# Patient Record
Sex: Male | Born: 1970
Health system: Southern US, Community
[De-identification: ages and names within clinical notes are randomized; demographics above are authoritative.]

## PROBLEM LIST (undated history)

## (undated) DIAGNOSIS — F419 Anxiety disorder, unspecified: Secondary | ICD-10-CM

## (undated) DIAGNOSIS — K219 Gastro-esophageal reflux disease without esophagitis: Secondary | ICD-10-CM

## (undated) DIAGNOSIS — G4733 Obstructive sleep apnea (adult) (pediatric): Secondary | ICD-10-CM

## (undated) DIAGNOSIS — G8929 Other chronic pain: Secondary | ICD-10-CM

## (undated) DIAGNOSIS — E291 Testicular hypofunction: Secondary | ICD-10-CM

## (undated) DIAGNOSIS — R635 Abnormal weight gain: Secondary | ICD-10-CM

## (undated) DIAGNOSIS — IMO0002 Reserved for concepts with insufficient information to code with codable children: Secondary | ICD-10-CM

## (undated) DIAGNOSIS — R6882 Decreased libido: Secondary | ICD-10-CM

## (undated) DIAGNOSIS — T7840XA Allergy, unspecified, initial encounter: Secondary | ICD-10-CM

## (undated) DIAGNOSIS — M9250 Juvenile osteochondrosis of tibia and fibula, unspecified leg: Secondary | ICD-10-CM

## (undated) DIAGNOSIS — R519 Headache, unspecified: Secondary | ICD-10-CM

## (undated) DIAGNOSIS — F329 Major depressive disorder, single episode, unspecified: Secondary | ICD-10-CM

## (undated) DIAGNOSIS — K3184 Gastroparesis: Secondary | ICD-10-CM

## (undated) DIAGNOSIS — F319 Bipolar disorder, unspecified: Secondary | ICD-10-CM

## (undated) DIAGNOSIS — M92529 Juvenile osteochondrosis of tibia tubercle, unspecified leg: Secondary | ICD-10-CM

## (undated) DIAGNOSIS — G709 Myoneural disorder, unspecified: Secondary | ICD-10-CM

## (undated) DIAGNOSIS — F17200 Nicotine dependence, unspecified, uncomplicated: Secondary | ICD-10-CM

## (undated) DIAGNOSIS — R51 Headache: Secondary | ICD-10-CM

## (undated) DIAGNOSIS — J309 Allergic rhinitis, unspecified: Secondary | ICD-10-CM

## (undated) HISTORY — DX: Allergic rhinitis, unspecified: J30.9

## (undated) HISTORY — DX: Juvenile osteochondrosis of tibia and fibula, unspecified leg: M92.50

## (undated) HISTORY — DX: Other chronic pain: G89.29

## (undated) HISTORY — DX: Obstructive sleep apnea (adult) (pediatric): G47.33

## (undated) HISTORY — DX: Juvenile osteochondrosis of tibia tubercle, unspecified leg: M92.529

## (undated) HISTORY — PX: ANKLE SURGERY: SHX546

## (undated) HISTORY — PX: FRACTURE SURGERY: SHX138

## (undated) HISTORY — DX: Allergy, unspecified, initial encounter: T78.40XA

## (undated) HISTORY — DX: Reserved for concepts with insufficient information to code with codable children: IMO0002

## (undated) HISTORY — PX: EYE SURGERY: SHX253

## (undated) HISTORY — DX: Nicotine dependence, unspecified, uncomplicated: F17.200

## (undated) HISTORY — DX: Abnormal weight gain: R63.5

## (undated) HISTORY — DX: Testicular hypofunction: E29.1

## (undated) HISTORY — DX: Gastro-esophageal reflux disease without esophagitis: K21.9

## (undated) HISTORY — DX: Decreased libido: R68.82

## (undated) HISTORY — DX: Major depressive disorder, single episode, unspecified: F32.9

## (undated) HISTORY — DX: Headache: R51

## (undated) HISTORY — DX: Headache, unspecified: R51.9

## (undated) HISTORY — DX: Anxiety disorder, unspecified: F41.9

## (undated) HISTORY — PX: LASIK: SHX215

---

## 2009-05-10 ENCOUNTER — Ambulatory Visit: Payer: Self-pay | Admitting: Internal Medicine

## 2009-05-10 DIAGNOSIS — R6882 Decreased libido: Secondary | ICD-10-CM | POA: Insufficient documentation

## 2009-05-10 DIAGNOSIS — F3289 Other specified depressive episodes: Secondary | ICD-10-CM

## 2009-05-10 DIAGNOSIS — K219 Gastro-esophageal reflux disease without esophagitis: Secondary | ICD-10-CM | POA: Insufficient documentation

## 2009-05-10 DIAGNOSIS — J309 Allergic rhinitis, unspecified: Secondary | ICD-10-CM | POA: Insufficient documentation

## 2009-05-10 DIAGNOSIS — R635 Abnormal weight gain: Secondary | ICD-10-CM

## 2009-05-10 DIAGNOSIS — F329 Major depressive disorder, single episode, unspecified: Secondary | ICD-10-CM

## 2009-05-10 DIAGNOSIS — R519 Headache, unspecified: Secondary | ICD-10-CM | POA: Insufficient documentation

## 2009-05-10 DIAGNOSIS — R51 Headache: Secondary | ICD-10-CM | POA: Insufficient documentation

## 2009-05-10 HISTORY — DX: Allergic rhinitis, unspecified: J30.9

## 2009-05-10 HISTORY — DX: Decreased libido: R68.82

## 2009-05-10 HISTORY — DX: Major depressive disorder, single episode, unspecified: F32.9

## 2009-05-10 HISTORY — DX: Gastro-esophageal reflux disease without esophagitis: K21.9

## 2009-05-10 HISTORY — DX: Abnormal weight gain: R63.5

## 2009-05-10 HISTORY — DX: Other specified depressive episodes: F32.89

## 2009-05-11 ENCOUNTER — Telehealth: Payer: Self-pay | Admitting: Internal Medicine

## 2009-05-11 LAB — CONVERTED CEMR LAB
ALT: 28 units/L (ref 0–53)
AST: 27 units/L (ref 0–37)
Albumin: 3.8 g/dL (ref 3.5–5.2)
Alkaline Phosphatase: 57 units/L (ref 39–117)
BUN: 6 mg/dL (ref 6–23)
Basophils Absolute: 0 10*3/uL (ref 0.0–0.1)
Basophils Relative: 0 % (ref 0.0–3.0)
Bilirubin, Direct: 0 mg/dL (ref 0.0–0.3)
CO2: 28 meq/L (ref 19–32)
Calcium: 8.9 mg/dL (ref 8.4–10.5)
Chloride: 105 meq/L (ref 96–112)
Cholesterol: 219 mg/dL — ABNORMAL HIGH (ref 0–200)
Creatinine, Ser: 0.9 mg/dL (ref 0.4–1.5)
Direct LDL: 130.6 mg/dL
Eosinophils Absolute: 0.2 10*3/uL (ref 0.0–0.7)
Eosinophils Relative: 3.5 % (ref 0.0–5.0)
GFR calc non Af Amer: 99.12 mL/min (ref >60)
Glucose, Bld: 103 mg/dL — ABNORMAL HIGH (ref 70–99)
HCT: 44 % (ref 39.0–52.0)
HDL: 48.8 mg/dL (ref >39.00)
Hemoglobin: 15.1 g/dL (ref 13.0–17.0)
Lymphocytes Relative: 33.7 % (ref 12.0–46.0)
Lymphs Abs: 1.6 10*3/uL (ref 0.7–4.0)
MCHC: 34.3 g/dL (ref 30.0–36.0)
MCV: 93.3 fL (ref 78.0–100.0)
Monocytes Absolute: 0.5 10*3/uL (ref 0.1–1.0)
Monocytes Relative: 9.6 % (ref 3.0–12.0)
Neutro Abs: 2.5 10*3/uL (ref 1.4–7.7)
Neutrophils Relative %: 53.2 % (ref 43.0–77.0)
Platelets: 230 10*3/uL (ref 150.0–400.0)
Potassium: 4.4 meq/L (ref 3.5–5.1)
RBC: 4.71 M/uL (ref 4.22–5.81)
RDW: 13 % (ref 11.5–14.6)
Sodium: 141 meq/L (ref 135–145)
TSH: 1.46 microintl units/mL (ref 0.35–5.50)
Testosterone: 297.89 ng/dL — ABNORMAL LOW (ref 350.00–890.00)
Total Bilirubin: 0.8 mg/dL (ref 0.3–1.2)
Total CHOL/HDL Ratio: 4
Total Protein: 6.6 g/dL (ref 6.0–8.3)
Triglycerides: 272 mg/dL — ABNORMAL HIGH (ref 0.0–149.0)
VLDL: 54.4 mg/dL — ABNORMAL HIGH (ref 0.0–40.0)
WBC: 4.8 10*3/uL (ref 4.5–10.5)

## 2009-06-12 ENCOUNTER — Ambulatory Visit: Payer: Self-pay | Admitting: Internal Medicine

## 2009-06-12 DIAGNOSIS — E349 Endocrine disorder, unspecified: Secondary | ICD-10-CM | POA: Insufficient documentation

## 2009-06-12 DIAGNOSIS — G4733 Obstructive sleep apnea (adult) (pediatric): Secondary | ICD-10-CM

## 2009-06-12 DIAGNOSIS — F17200 Nicotine dependence, unspecified, uncomplicated: Secondary | ICD-10-CM | POA: Insufficient documentation

## 2009-06-12 DIAGNOSIS — E291 Testicular hypofunction: Secondary | ICD-10-CM

## 2009-06-12 HISTORY — DX: Testicular hypofunction: E29.1

## 2009-06-12 HISTORY — DX: Nicotine dependence, unspecified, uncomplicated: F17.200

## 2009-06-12 HISTORY — DX: Obstructive sleep apnea (adult) (pediatric): G47.33

## 2009-07-10 ENCOUNTER — Ambulatory Visit (HOSPITAL_BASED_OUTPATIENT_CLINIC_OR_DEPARTMENT_OTHER): Admission: RE | Admit: 2009-07-10 | Discharge: 2009-07-10 | Payer: Self-pay | Admitting: Internal Medicine

## 2009-07-10 ENCOUNTER — Encounter (INDEPENDENT_AMBULATORY_CARE_PROVIDER_SITE_OTHER): Payer: Self-pay | Admitting: *Deleted

## 2009-07-23 ENCOUNTER — Ambulatory Visit: Payer: Self-pay | Admitting: Pulmonary Disease

## 2009-07-31 ENCOUNTER — Telehealth: Payer: Self-pay | Admitting: Internal Medicine

## 2009-08-02 ENCOUNTER — Encounter (INDEPENDENT_AMBULATORY_CARE_PROVIDER_SITE_OTHER): Payer: Self-pay | Admitting: *Deleted

## 2009-08-15 ENCOUNTER — Ambulatory Visit: Payer: Self-pay | Admitting: Internal Medicine

## 2009-08-15 DIAGNOSIS — K5289 Other specified noninfective gastroenteritis and colitis: Secondary | ICD-10-CM | POA: Insufficient documentation

## 2009-09-12 ENCOUNTER — Encounter: Payer: Self-pay | Admitting: Internal Medicine

## 2009-12-12 ENCOUNTER — Telehealth: Payer: Self-pay | Admitting: Internal Medicine

## 2010-04-03 ENCOUNTER — Encounter: Payer: Self-pay | Admitting: Internal Medicine

## 2010-08-29 ENCOUNTER — Telehealth: Payer: Self-pay | Admitting: Internal Medicine

## 2010-09-12 ENCOUNTER — Ambulatory Visit
Admission: RE | Admit: 2010-09-12 | Discharge: 2010-09-12 | Payer: Self-pay | Source: Home / Self Care | Attending: Internal Medicine | Admitting: Internal Medicine

## 2010-09-12 ENCOUNTER — Other Ambulatory Visit: Payer: Self-pay | Admitting: Internal Medicine

## 2010-09-12 LAB — TESTOSTERONE: Testosterone: 202.19 ng/dL — ABNORMAL LOW (ref 350.00–890.00)

## 2010-09-17 ENCOUNTER — Encounter: Payer: Self-pay | Admitting: Internal Medicine

## 2010-10-01 NOTE — Letter (Signed)
Summary: [facility]  [facility]   Imported By: Maryln Gottron 04/22/2010 14:38:56  _____________________________________________________________________  External Attachment:    Type:   Image     Comment:   External Document

## 2010-10-01 NOTE — Progress Notes (Signed)
Summary: no call no show  no call no show - attempt to call to f/u - voice mail - left msg. KIK

## 2010-10-02 ENCOUNTER — Encounter: Payer: Self-pay | Admitting: Internal Medicine

## 2010-10-03 ENCOUNTER — Encounter: Payer: Self-pay | Admitting: Internal Medicine

## 2010-10-03 ENCOUNTER — Ambulatory Visit (INDEPENDENT_AMBULATORY_CARE_PROVIDER_SITE_OTHER): Payer: 59 | Admitting: Internal Medicine

## 2010-10-03 VITALS — BP 128/80 | HR 96 | Temp 97.7°F | Resp 18 | Ht 68.0 in | Wt 230.0 lb

## 2010-10-03 DIAGNOSIS — J309 Allergic rhinitis, unspecified: Secondary | ICD-10-CM

## 2010-10-03 DIAGNOSIS — F17200 Nicotine dependence, unspecified, uncomplicated: Secondary | ICD-10-CM

## 2010-10-03 DIAGNOSIS — L02413 Cutaneous abscess of right upper limb: Secondary | ICD-10-CM | POA: Insufficient documentation

## 2010-10-03 DIAGNOSIS — IMO0002 Reserved for concepts with insufficient information to code with codable children: Secondary | ICD-10-CM

## 2010-10-03 MED ORDER — FLUTICASONE PROPIONATE 50 MCG/ACT NA SUSP: 1.0000 | Freq: Every day | NASAL | Status: DC

## 2010-10-03 MED ORDER — AMOXICILLIN-POT CLAVULANATE 875-125 MG PO TABS: 1.0000 | ORAL_TABLET | Freq: Two times a day (BID) | ORAL | Status: AC

## 2010-10-03 NOTE — Patient Instructions (Signed)
Smoking tobacco is very bad for your health. You should stop smoking immediately.  Take your antibiotic as prescribed until ALL of it is gone, but stop if you develop a rash, swelling, or any side effects of the medication.  Contact our office as soon as possible if  there are side effects of the medication.

## 2010-10-03 NOTE — Progress Notes (Signed)
Summary: recheck lab  Phone Note Call from Patient Call back at [phone]   [name]: Patient Call For: Sean Savers  MD Summary of Call: pt would like testosterone level recheck can I sch? Initial call taken by: Heron Sabins,  August 29, 2010 10:49 AM  Follow-up for Phone Call        OK -do early am draw Follow-up by: Sean Savers  MD,  August 29, 2010 1:20 PM  Additional Follow-up for Phone Call Additional follow up Details #1::        lmom/lmom 09-03-2009/lmom 1-6 [facility] 09-10-2009 Additional Follow-up by: Heron Sabins,  August 29, 2010 2:25 PM    Additional Follow-up for Phone Call Additional follow up Details #2::    09-12-2010 10.15A LAB OV Follow-up by: Heron Sabins,  September 10, 2010 11:19 AM

## 2010-10-03 NOTE — Progress Notes (Signed)
  Subjective:    Patient ID: Sean Wu, male    DOB: 02/05/1971, 40 y.o.   MRN: [id number]  HPI  38  -year-old patient who has a history of allergic rhinitis.  For the past several weeks he has had worsening sinus congestion some purulent drainage and discomfort.  He has been using Allegra.  He has benefited from nasal steroids in the past.  He received immunotherapy in August of last year and has had a papule involving his right upper arm since the injection.  For the past couple of weeks she has had increasing pain and redness.  There is been no fever chills or other constitutional complaints. He has a history of depression and obstructive sleep apnea which have been stable.    Review of Systems  Constitutional: Negative for fever, chills, appetite change and fatigue.  HENT: Positive for congestion, rhinorrhea and postnasal drip. Negative for hearing loss, ear pain, sore throat, trouble swallowing, neck stiffness, dental problem, voice change and tinnitus.   Eyes: Negative for pain, discharge and visual disturbance.  Respiratory: Negative for cough, chest tightness, wheezing and stridor.   Cardiovascular: Negative for chest pain, palpitations and leg swelling.  Gastrointestinal: Negative for nausea, vomiting, abdominal pain, diarrhea, constipation, blood in stool and abdominal distention.  Genitourinary: Negative for urgency, hematuria, flank pain, discharge, difficulty urinating and genital sores.  Musculoskeletal: Negative for myalgias, back pain, joint swelling, arthralgias and gait problem.  Skin: Positive for rash.  Neurological: Negative for dizziness, syncope, speech difficulty, weakness, numbness and headaches.  Hematological: Negative for adenopathy. Does not bruise/bleed easily.  Psychiatric/Behavioral: Negative for behavioral problems and dysphoric mood. The patient is not nervous/anxious.        Objective:   Physical Exam  Constitutional: He is oriented to person, place,  and time. He appears well-developed and well-nourished.  HENT:  Head: Normocephalic.  Right Ear: External ear normal.  Left Ear: External ear normal.  Eyes: Conjunctivae and EOM are normal.  Neck: Normal range of motion.  Cardiovascular: Normal rate and normal heart sounds.   Pulmonary/Chest: Breath sounds normal.  Abdominal: Bowel sounds are normal.  Musculoskeletal: Normal range of motion. He exhibits no edema and no tenderness.  Neurological: He is alert and oriented to person, place, and time.  Skin: Skin is warm and dry. Rash (erythematous  nodule R upper outer arm; no fluctuance ) noted.  Psychiatric: He has a normal mood and affect. His behavior is normal.          Assessment & Plan:  1.  Allergic rhinitis- may have a low-grade sinus infection.  Will treat with Augmentin, primarily for his skin and soft tissue infection.  Will also place on nasal steroids, which had been quite helpful in the past 2.  Skin and soft tissue infection right upper arm.  Local skin care discussed.  Will tree with antibiotic therapy 3. Tobacco abuse.  Cessation of smoking.  Encouraged

## 2010-10-03 NOTE — Letter (Signed)
Summary: Generic Letter  [employer] [facility]  [address] [place], [place] [address]   Phone: [phone]  Fax: [phone]    09/17/2010  [place] [address] [place], [place]  [address]  Dear Mr. Cesaro,  I have been unable to reach you by phone to discuss recent lab work drawn and medication that Dr. Frederica Kuster would like to have you start.  Please contact the office at your convenience to disuss.          Sincerely,   Duard Brady LPN

## 2010-10-09 NOTE — Assessment & Plan Note (Signed)
Summary: bump on right arm//ccm   Allergies: No Known Drug Allergies   Complete Medication List: 1)  Lamictal 150 Mg Tabs (Lamotrigine) .Marland Kitchen.. 1 once daily 2)  Depakote 500 Mg Tbec (Divalproex sodium) .... 3 once daily 3)  Androgel 50 Mg/5gm Gel (Testosterone) .... Apply daily 4)  Promethazine Hcl 50 Mg Tabs (Promethazine hcl) .... One every 6 hours as needed for nausea 5)  Diphenoxylate-atropine 2.5-0.025 Mg Tabs (Diphenoxylate-atropine) .... Two initially, then one every 4 hours as needed for diarrhea 6)  Androgel 50 Mg/5gm Gel (Testosterone) .... Apply q am as directed

## 2010-11-05 ENCOUNTER — Other Ambulatory Visit: Payer: Self-pay | Admitting: Internal Medicine

## 2010-11-05 NOTE — Telephone Encounter (Signed)
Pt called and said that [facility] mail order pharmacy req 3 month supply of androgel. This has to be faxed to [facility]. Can not be called in.

## 2010-11-06 ENCOUNTER — Other Ambulatory Visit: Payer: Self-pay

## 2010-11-06 ENCOUNTER — Telehealth: Payer: Self-pay

## 2010-11-06 MED ORDER — TESTOSTERONE 50 MG/5GM (1%) TD GEL: 5.0000 g | Freq: Every day | TRANSDERMAL | Status: DC

## 2010-11-06 NOTE — Telephone Encounter (Signed)
Medication filled and faxed to [facility].

## 2010-11-06 NOTE — Telephone Encounter (Signed)
androgel rx to [facility] done. KIK

## 2010-11-06 NOTE — Telephone Encounter (Signed)
Error

## 2010-11-21 ENCOUNTER — Telehealth: Payer: Self-pay | Admitting: Internal Medicine

## 2010-11-21 NOTE — Telephone Encounter (Signed)
viagra 100 mg  #12  RF 3

## 2010-11-21 NOTE — Telephone Encounter (Signed)
Please advise 

## 2010-11-21 NOTE — Telephone Encounter (Signed)
Pt called and said having erectile dysfunction problems, even after being on testosterone. Pt req Viagra or Cialis, or alternative. Pls call pt asap.    Pt uses [facility] [address].

## 2010-11-22 MED ORDER — SILDENAFIL CITRATE 100 MG PO TABS: 100.0000 mg | ORAL_TABLET | ORAL | Status: DC | PRN

## 2010-11-22 NOTE — Telephone Encounter (Signed)
Med added to list - pt aware and called into [facility]

## 2010-11-28 ENCOUNTER — Encounter: Payer: Self-pay | Admitting: Internal Medicine

## 2010-11-28 ENCOUNTER — Ambulatory Visit (INDEPENDENT_AMBULATORY_CARE_PROVIDER_SITE_OTHER): Payer: 59 | Admitting: Internal Medicine

## 2010-11-28 VITALS — BP 120/80 | Temp 98.4°F | Ht 69.0 in | Wt 224.0 lb

## 2010-11-28 DIAGNOSIS — M549 Dorsalgia, unspecified: Secondary | ICD-10-CM

## 2010-11-28 MED ORDER — METHYLPREDNISOLONE ACETATE 80 MG/ML IJ SUSP
80.0000 mg | Freq: Once | INTRAMUSCULAR | Status: AC
  Administered 2010-11-28: 80 mg via INTRAMUSCULAR

## 2010-11-28 MED ORDER — CYCLOBENZAPRINE HCL 10 MG PO TABS: 10.0000 mg | ORAL_TABLET | Freq: Three times a day (TID) | ORAL | Status: AC | PRN

## 2010-11-28 NOTE — Progress Notes (Signed)
  Subjective:    Patient ID: Sean Wu, male    DOB: [birth year], 40 y.o.   MRN: [id number]  HPI  40 year old patient who is seen today with a chief complaint of back pain. This has been present for approximately 6 days pain is localized in the mid back more on the left. He does fairly well throughout the day but has significant pain during the night that interferes with his sleep. Pain in his left mid back area. Pain is aggravated by twisting especially to the left. He has had no trauma and no unusual activities. No history of chronic back pain. No constitutional symptoms    Review of Systems  Constitutional: Negative for fever, chills, appetite change and fatigue.  Musculoskeletal: Positive for back pain. Negative for joint swelling, arthralgias and gait problem.       Objective:   Physical Exam  Constitutional: He appears well-developed and well-nourished. No distress.  Musculoskeletal: Normal range of motion. He exhibits no edema.       No tenderness over the spinous processes. There was some tenderness in the left mid back area. The paravertebral musculature appear to be tight and tense pain was aggravated somewhat twisting to the left. There is no low back pain straight leg testing was negative          Assessment & Plan:  Mid back pain left-sided. This appears to be musculoligamentous. We'll treat with 80 mg of Depo-Medrol and if needed will start Aleve. His symptoms are worse at night and will try a bedtime dose of Flexeril. He therapy gentle massage therapy also discussed

## 2010-11-28 NOTE — Patient Instructions (Signed)
Most patients with low back pain will improve with time over the next two  weeks.  Keep active but avoid any activities that cause pain.  Apply moist heat to the low back area several times daily.   Take Aleve 200 mg twice daily for pain or swelling  Call or return to clinic prn if these symptoms worsen or fail to improve as anticipated.

## 2011-01-10 ENCOUNTER — Ambulatory Visit (INDEPENDENT_AMBULATORY_CARE_PROVIDER_SITE_OTHER): Payer: 59 | Admitting: Internal Medicine

## 2011-01-10 ENCOUNTER — Encounter: Payer: Self-pay | Admitting: Internal Medicine

## 2011-01-10 VITALS — BP 124/90 | Temp 98.4°F | Wt 227.0 lb

## 2011-01-10 DIAGNOSIS — K219 Gastro-esophageal reflux disease without esophagitis: Secondary | ICD-10-CM

## 2011-01-10 MED ORDER — OMEPRAZOLE MAGNESIUM 20 MG PO TBEC: DELAYED_RELEASE_TABLET | ORAL | Status: DC

## 2011-01-10 NOTE — Progress Notes (Signed)
  Subjective:    Patient ID: Sean Wu, male    DOB: [birth year], 40 y.o.   MRN: [id number]  HPI  40 year old patient who has a history of gastroesophageal reflux disease. He presents today with a several day history of increasing GERD symptoms with pain bloating and occasional vomiting. He has obstructive sleep apnea using CPAP and he states that he has occasionally vomited into his CPAP apparatus;  he has been using Prilosec 20 mg which he takes once a day in the morning.   There's been some modest weight gain. He is not on any real anti-reflux diet  Review of Systems  Constitutional: Negative for fever, chills, appetite change and fatigue.  HENT: Negative for hearing loss, ear pain, congestion, sore throat, trouble swallowing, neck stiffness, dental problem, voice change and tinnitus.   Eyes: Negative for pain, discharge and visual disturbance.  Respiratory: Negative for cough, chest tightness, wheezing and stridor.   Cardiovascular: Negative for chest pain, palpitations and leg swelling.  Gastrointestinal: Positive for nausea, abdominal pain and abdominal distention. Negative for vomiting, diarrhea, constipation and blood in stool.  Genitourinary: Negative for urgency, hematuria, flank pain, discharge, difficulty urinating and genital sores.  Musculoskeletal: Negative for myalgias, back pain, joint swelling, arthralgias and gait problem.  Skin: Negative for rash.  Neurological: Negative for dizziness, syncope, speech difficulty, weakness, numbness and headaches.  Hematological: Negative for adenopathy. Does not bruise/bleed easily.  Psychiatric/Behavioral: Negative for behavioral problems and dysphoric mood. The patient is not nervous/anxious.        Objective:   Physical Exam  Constitutional: He is oriented to person, place, and time. He appears well-developed and well-nourished. No distress.  HENT:  Head: Normocephalic.  Right Ear: External ear normal.  Left Ear: External ear  normal.  Eyes: Conjunctivae and EOM are normal.  Neck: Normal range of motion.  Cardiovascular: Normal rate and normal heart sounds.   Pulmonary/Chest: Breath sounds normal.  Abdominal: Soft. Bowel sounds are normal. He exhibits no distension and no mass. There is no tenderness. There is no rebound and no guarding.  Musculoskeletal: Normal range of motion. He exhibits no edema and no tenderness.  Neurological: He is alert and oriented to person, place, and time.  Psychiatric: He has a normal mood and affect. His behavior is normal.          Assessment & Plan:  Gastroesophageal reflux disease. Moderately severe. The patient will increase his Prilosec to a twice a day regimen. He will attempt to take this medication fasting. He would be placed on a aggressive anti-reflux regimen. He will call if unimproved

## 2011-01-10 NOTE — Patient Instructions (Signed)
Avoids foods high in acid such as tomatoes citrus juices, and spicy foods.  Avoid eating within two hours of lying down or before exercising.  Do not overheat.  Try smaller more frequent meals.  If symptoms persist, elevate the head of her bed 12 inches while sleeping.  You need to lose weight.  Consider a lower calorie diet and regular exercise.  Call or return to clinic prn if these symptoms worsen or fail to improve as anticipated. Acid Reflux (GERD) Acid reflux is also called gastroesophageal reflux disease (GERD). Your stomach makes acid to help digest food. Acid reflux happens when acid from your stomach goes into the tube between your mouth and stomach (esophagus). Your stomach is protected from the acid, but this tube is not. When acid gets into the tube, it may cause a burning feeling in the chest (heartburn). Besides heartburn, other health problems can happen if the acid keeps going into the tube. Some causes of acid reflux include:  Being overweight.   Smoking.   Drinking alcohol.   Eating large meals.   Eating meals and then going to bed right away.   Eating certain foods.   Increased stomach acid production.  HOME CARE  Take all medicine as told by your doctor.   You may need to:   Lose weight.   Avoid alcohol.   Quit smoking.   Do not eat big meals. It is better to eat smaller meals throughout the day.   Do not eat a meal and then nap or go to bed.   Sleep with your head higher than your stomach.   Avoid foods that bother you.   You may need more tests, or you may need to see a special doctor.  GET HELP RIGHT AWAY IF:  You have chest pain that is different than before.   You have pain that goes to your arms, jaw, or between your shoulder blades.   You throw up (vomit) blood, dark brown liquid, or your throw up looks like coffee grounds.   You have trouble swallowing.   You have trouble breathing or cannot stop coughing.   You feel dizzy or pass  out.   Your skin is cool, wet, and pale.   Your medicine is not helping.  MAKE SURE YOU:    Understand these instructions.   Will watch your condition.   Will get help right away if you are not doing well or get worse.  Document Released: 02/04/2008 Document Re-Released: 11/12/2009 [facility] Patient Information 2011 [place], [place].Acid Reflux (GERD) Acid reflux is also called gastroesophageal reflux disease (GERD). Your stomach makes acid to help digest food. Acid reflux happens when acid from your stomach goes into the tube between your mouth and stomach (esophagus). Your stomach is protected from the acid, but this tube is not. When acid gets into the tube, it may cause a burning feeling in the chest (heartburn). Besides heartburn, other health problems can happen if the acid keeps going into the tube. Some causes of acid reflux include:  Being overweight.   Smoking.   Drinking alcohol.   Eating large meals.   Eating meals and then going to bed right away.   Eating certain foods.   Increased stomach acid production.  HOME CARE  Take all medicine as told by your doctor.   You may need to:   Lose weight.   Avoid alcohol.   Quit smoking.   Do not eat big meals. It is better to eat  smaller meals throughout the day.   Do not eat a meal and then nap or go to bed.   Sleep with your head higher than your stomach.   Avoid foods that bother you.   You may need more tests, or you may need to see a special doctor.  GET HELP RIGHT AWAY IF:  You have chest pain that is different than before.   You have pain that goes to your arms, jaw, or between your shoulder blades.   You throw up (vomit) blood, dark brown liquid, or your throw up looks like coffee grounds.   You have trouble swallowing.   You have trouble breathing or cannot stop coughing.   You feel dizzy or pass out.   Your skin is cool, wet, and pale.   Your medicine is not helping.  MAKE SURE YOU:     Understand these instructions.   Will watch your condition.   Will get help right away if you are not doing well or get worse.  Document Released: 02/04/2008 Document Re-Released: 11/12/2009 [facility] Patient Information 2011 [place], [place].Heartburn Heartburn is a painful, burning sensation in the chest. It may feel worse in certain positions, such as lying down or bending over. It is caused by stomach acid backing up into the tube that carries food from the mouth down to the stomach (lower esophagus).   CAUSES A number of conditions can cause or worsen heartburn, including:    Pregnancy.   Being overweight (obesity).   A condition called hiatal hernia, in which part or all of the stomach is moved up into the chest through a weakness in the diaphragm muscle.   Alcohol.   Exercise.   Eating just before going to bed.   Overeating.   Medications, including:   Nonsteroidal anti-inflammatory drugs, such as ibuprofen and naproxen.   Aspirin.   Some blood pressure medicines, including beta-blockers, calcium channel blockers, and alpha-blockers.   Nitrates (used to treat angina).   The asthma medication Theophylline.   Certain sedative drugs.   Heartburn may be worse after eating certain foods. These heartburn-causing foods are different for different people, but may include:   Peppers.   Chocolate.   Coffee.   High-fat foods, including fried foods.   Spicy foods.   Garlic, onions.   Citrus fruits, including oranges, grapefruit, lemons and limes.   Food containing tomatoes or tomato products.   Mint.   Carbonated beverages.   Vinegar.  SYMPTOMS   Symptoms may last for a few minutes or a few hours, and can include:  Burning pain in the chest or lower throat.   Bitter taste in the mouth.   Coughing.  DIAGNOSIS If the usual treatments for heartburn do not improve your symptoms, then tests may be done to see if there is another condition present.  Possible tests may include:  X-rays.   Endoscopy. This is when a tube with a light and a camera on the end is used to examine the esophagus and the stomach.   Blood, breath, or stool tests may be used to check for bacteria that cause ulcers.  TREATMENT There are a number of non-prescription medicines used to treat heartburn, including:  Antacids.   Acid reducers (also called H-2 blockers).   Proton-pump inhibitors.  HOME CARE INSTRUCTIONS  Raise the head of your bed by putting blocks under the legs.   Eat 2-3 hours before going to bed.   Stop smoking.   Try to reach and maintain  a healthy weight.   Do not eat just a few very large meals. Instead, eat many smaller meals throughout the day.   Try to identify foods and beverages that make your symptoms worse, and avoid these.   Avoid tight clothing.   Do not exercise right after eating.  SEEK IMMEDIATE MEDICAL CARE IF YOU:  Have severe chest pain that goes down your arm, or into your jaw or neck.   Feel sweaty, dizzy, or lightheaded.   Are short of breath.   Throw up (vomit) blood.   Have difficulty or pain with swallowing.   Have bloody or black, tarry stools.   Have bouts of heartburn more than three times a week for more than two weeks.  Document Released: 01/04/2009 Document Re-Released: 11/12/2009 [facility] Patient Information 2011 [place], [place].Heartburn Heartburn is a painful, burning sensation in the chest. It may feel worse in certain positions, such as lying down or bending over. It is caused by stomach acid backing up into the tube that carries food from the mouth down to the stomach (lower esophagus).   CAUSES A number of conditions can cause or worsen heartburn, including:    Pregnancy.   Being overweight (obesity).   A condition called hiatal hernia, in which part or all of the stomach is moved up into the chest through a weakness in the diaphragm muscle.   Alcohol.   Exercise.   Eating just  before going to bed.   Overeating.   Medications, including:   Nonsteroidal anti-inflammatory drugs, such as ibuprofen and naproxen.   Aspirin.   Some blood pressure medicines, including beta-blockers, calcium channel blockers, and alpha-blockers.   Nitrates (used to treat angina).   The asthma medication Theophylline.   Certain sedative drugs.   Heartburn may be worse after eating certain foods. These heartburn-causing foods are different for different people, but may include:   Peppers.   Chocolate.   Coffee.   High-fat foods, including fried foods.   Spicy foods.   Garlic, onions.   Citrus fruits, including oranges, grapefruit, lemons and limes.   Food containing tomatoes or tomato products.   Mint.   Carbonated beverages.   Vinegar.  SYMPTOMS   Symptoms may last for a few minutes or a few hours, and can include:  Burning pain in the chest or lower throat.   Bitter taste in the mouth.   Coughing.  DIAGNOSIS If the usual treatments for heartburn do not improve your symptoms, then tests may be done to see if there is another condition present. Possible tests may include:  X-rays.   Endoscopy. This is when a tube with a light and a camera on the end is used to examine the esophagus and the stomach.   Blood, breath, or stool tests may be used to check for bacteria that cause ulcers.  TREATMENT There are a number of non-prescription medicines used to treat heartburn, including:  Antacids.   Acid reducers (also called H-2 blockers).   Proton-pump inhibitors.  HOME CARE INSTRUCTIONS  Raise the head of your bed by putting blocks under the legs.   Eat 2-3 hours before going to bed.   Stop smoking.   Try to reach and maintain a healthy weight.   Do not eat just a few very large meals. Instead, eat many smaller meals throughout the day.   Try to identify foods and beverages that make your symptoms worse, and avoid these.   Avoid tight clothing.    Do not exercise  right after eating.  SEEK IMMEDIATE MEDICAL CARE IF YOU:  Have severe chest pain that goes down your arm, or into your jaw or neck.   Feel sweaty, dizzy, or lightheaded.   Are short of breath.   Throw up (vomit) blood.   Have difficulty or pain with swallowing.   Have bloody or black, tarry stools.   Have bouts of heartburn more than three times a week for more than two weeks.  Document Released: 01/04/2009 Document Re-Released: 11/12/2009 [facility] Patient Information 2011 [place], [place].Heartburn Heartburn is a painful, burning sensation in the chest. It may feel worse in certain positions, such as lying down or bending over. It is caused by stomach acid backing up into the tube that carries food from the mouth down to the stomach (lower esophagus).   CAUSES A number of conditions can cause or worsen heartburn, including:    Pregnancy.   Being overweight (obesity).   A condition called hiatal hernia, in which part or all of the stomach is moved up into the chest through a weakness in the diaphragm muscle.   Alcohol.   Exercise.   Eating just before going to bed.   Overeating.   Medications, including:   Nonsteroidal anti-inflammatory drugs, such as ibuprofen and naproxen.   Aspirin.   Some blood pressure medicines, including beta-blockers, calcium channel blockers, and alpha-blockers.   Nitrates (used to treat angina).   The asthma medication Theophylline.   Certain sedative drugs.   Heartburn may be worse after eating certain foods. These heartburn-causing foods are different for different people, but may include:   Peppers.   Chocolate.   Coffee.   High-fat foods, including fried foods.   Spicy foods.   Garlic, onions.   Citrus fruits, including oranges, grapefruit, lemons and limes.   Food containing tomatoes or tomato products.   Mint.   Carbonated beverages.   Vinegar.  SYMPTOMS   Symptoms may last for a few minutes  or a few hours, and can include:  Burning pain in the chest or lower throat.   Bitter taste in the mouth.   Coughing.  DIAGNOSIS If the usual treatments for heartburn do not improve your symptoms, then tests may be done to see if there is another condition present. Possible tests may include:  X-rays.   Endoscopy. This is when a tube with a light and a camera on the end is used to examine the esophagus and the stomach.   Blood, breath, or stool tests may be used to check for bacteria that cause ulcers.  TREATMENT There are a number of non-prescription medicines used to treat heartburn, including:  Antacids.   Acid reducers (also called H-2 blockers).   Proton-pump inhibitors.  HOME CARE INSTRUCTIONS  Raise the head of your bed by putting blocks under the legs.   Eat 2-3 hours before going to bed.   Stop smoking.   Try to reach and maintain a healthy weight.   Do not eat just a few very large meals. Instead, eat many smaller meals throughout the day.   Try to identify foods and beverages that make your symptoms worse, and avoid these.   Avoid tight clothing.   Do not exercise right after eating.  SEEK IMMEDIATE MEDICAL CARE IF YOU:  Have severe chest pain that goes down your arm, or into your jaw or neck.   Feel sweaty, dizzy, or lightheaded.   Are short of breath.   Throw up (vomit) blood.   Have difficulty  or pain with swallowing.   Have bloody or black, tarry stools.   Have bouts of heartburn more than three times a week for more than two weeks.  Document Released: 01/04/2009 Document Re-Released: 11/12/2009 [facility] Patient Information 2011 [place], [place].Heartburn Heartburn is a painful, burning sensation in the chest. It may feel worse in certain positions, such as lying down or bending over. It is caused by stomach acid backing up into the tube that carries food from the mouth down to the stomach (lower esophagus).   CAUSES A number of conditions can  cause or worsen heartburn, including:    Pregnancy.   Being overweight (obesity).   A condition called hiatal hernia, in which part or all of the stomach is moved up into the chest through a weakness in the diaphragm muscle.   Alcohol.   Exercise.   Eating just before going to bed.   Overeating.   Medications, including:   Nonsteroidal anti-inflammatory drugs, such as ibuprofen and naproxen.   Aspirin.   Some blood pressure medicines, including beta-blockers, calcium channel blockers, and alpha-blockers.   Nitrates (used to treat angina).   The asthma medication Theophylline.   Certain sedative drugs.   Heartburn may be worse after eating certain foods. These heartburn-causing foods are different for different people, but may include:   Peppers.   Chocolate.   Coffee.   High-fat foods, including fried foods.   Spicy foods.   Garlic, onions.   Citrus fruits, including oranges, grapefruit, lemons and limes.   Food containing tomatoes or tomato products.   Mint.   Carbonated beverages.   Vinegar.  SYMPTOMS   Symptoms may last for a few minutes or a few hours, and can include:  Burning pain in the chest or lower throat.   Bitter taste in the mouth.   Coughing.  DIAGNOSIS If the usual treatments for heartburn do not improve your symptoms, then tests may be done to see if there is another condition present. Possible tests may include:  X-rays.   Endoscopy. This is when a tube with a light and a camera on the end is used to examine the esophagus and the stomach.   Blood, breath, or stool tests may be used to check for bacteria that cause ulcers.  TREATMENT There are a number of non-prescription medicines used to treat heartburn, including:  Antacids.   Acid reducers (also called H-2 blockers).   Proton-pump inhibitors.  HOME CARE INSTRUCTIONS  Raise the head of your bed by putting blocks under the legs.   Eat 2-3 hours before going to bed.    Stop smoking.   Try to reach and maintain a healthy weight.   Do not eat just a few very large meals. Instead, eat many smaller meals throughout the day.   Try to identify foods and beverages that make your symptoms worse, and avoid these.   Avoid tight clothing.   Do not exercise right after eating.  SEEK IMMEDIATE MEDICAL CARE IF YOU:  Have severe chest pain that goes down your arm, or into your jaw or neck.   Feel sweaty, dizzy, or lightheaded.   Are short of breath.   Throw up (vomit) blood.   Have difficulty or pain with swallowing.   Have bloody or black, tarry stools.   Have bouts of heartburn more than three times a week for more than two weeks.  Document Released: 01/04/2009 Document Re-Released: 11/12/2009 [facility] Patient Information 2011 [place], [place].Heartburn Heartburn is a painful, burning sensation  in the chest. It may feel worse in certain positions, such as lying down or bending over. It is caused by stomach acid backing up into the tube that carries food from the mouth down to the stomach (lower esophagus).   CAUSES A number of conditions can cause or worsen heartburn, including:    Pregnancy.   Being overweight (obesity).   A condition called hiatal hernia, in which part or all of the stomach is moved up into the chest through a weakness in the diaphragm muscle.   Alcohol.   Exercise.   Eating just before going to bed.   Overeating.   Medications, including:   Nonsteroidal anti-inflammatory drugs, such as ibuprofen and naproxen.   Aspirin.   Some blood pressure medicines, including beta-blockers, calcium channel blockers, and alpha-blockers.   Nitrates (used to treat angina).   The asthma medication Theophylline.   Certain sedative drugs.   Heartburn may be worse after eating certain foods. These heartburn-causing foods are different for different people, but may include:   Peppers.   Chocolate.   Coffee.   High-fat foods,  including fried foods.   Spicy foods.   Garlic, onions.   Citrus fruits, including oranges, grapefruit, lemons and limes.   Food containing tomatoes or tomato products.   Mint.   Carbonated beverages.   Vinegar.  SYMPTOMS   Symptoms may last for a few minutes or a few hours, and can include:  Burning pain in the chest or lower throat.   Bitter taste in the mouth.   Coughing.  DIAGNOSIS If the usual treatments for heartburn do not improve your symptoms, then tests may be done to see if there is another condition present. Possible tests may include:  X-rays.   Endoscopy. This is when a tube with a light and a camera on the end is used to examine the esophagus and the stomach.   Blood, breath, or stool tests may be used to check for bacteria that cause ulcers.  TREATMENT There are a number of non-prescription medicines used to treat heartburn, including:  Antacids.   Acid reducers (also called H-2 blockers).   Proton-pump inhibitors.  HOME CARE INSTRUCTIONS  Raise the head of your bed by putting blocks under the legs.   Eat 2-3 hours before going to bed.   Stop smoking.   Try to reach and maintain a healthy weight.   Do not eat just a few very large meals. Instead, eat many smaller meals throughout the day.   Try to identify foods and beverages that make your symptoms worse, and avoid these.   Avoid tight clothing.   Do not exercise right after eating.  SEEK IMMEDIATE MEDICAL CARE IF YOU:  Have severe chest pain that goes down your arm, or into your jaw or neck.   Feel sweaty, dizzy, or lightheaded.   Are short of breath.   Throw up (vomit) blood.   Have difficulty or pain with swallowing.   Have bloody or black, tarry stools.   Have bouts of heartburn more than three times a week for more than two weeks.  Document Released: 01/04/2009 Document Re-Released: 11/12/2009 [facility] Patient Information 2011 [place], [place].Heartburn Heartburn is a  painful, burning sensation in the chest. It may feel worse in certain positions, such as lying down or bending over. It is caused by stomach acid backing up into the tube that carries food from the mouth down to the stomach (lower esophagus).   CAUSES A number of conditions can  cause or worsen heartburn, including:    Pregnancy.   Being overweight (obesity).   A condition called hiatal hernia, in which part or all of the stomach is moved up into the chest through a weakness in the diaphragm muscle.   Alcohol.   Exercise.   Eating just before going to bed.   Overeating.   Medications, including:   Nonsteroidal anti-inflammatory drugs, such as ibuprofen and naproxen.   Aspirin.   Some blood pressure medicines, including beta-blockers, calcium channel blockers, and alpha-blockers.   Nitrates (used to treat angina).   The asthma medication Theophylline.   Certain sedative drugs.   Heartburn may be worse after eating certain foods. These heartburn-causing foods are different for different people, but may include:   Peppers.   Chocolate.   Coffee.   High-fat foods, including fried foods.   Spicy foods.   Garlic, onions.   Citrus fruits, including oranges, grapefruit, lemons and limes.   Food containing tomatoes or tomato products.   Mint.   Carbonated beverages.   Vinegar.  SYMPTOMS   Symptoms may last for a few minutes or a few hours, and can include:  Burning pain in the chest or lower throat.   Bitter taste in the mouth.   Coughing.  DIAGNOSIS If the usual treatments for heartburn do not improve your symptoms, then tests may be done to see if there is another condition present. Possible tests may include:  X-rays.   Endoscopy. This is when a tube with a light and a camera on the end is used to examine the esophagus and the stomach.   Blood, breath, or stool tests may be used to check for bacteria that cause ulcers.  TREATMENT There are a number of  non-prescription medicines used to treat heartburn, including:  Antacids.   Acid reducers (also called H-2 blockers).   Proton-pump inhibitors.  HOME CARE INSTRUCTIONS  Raise the head of your bed by putting blocks under the legs.   Eat 2-3 hours before going to bed.   Stop smoking.   Try to reach and maintain a healthy weight.   Do not eat just a few very large meals. Instead, eat many smaller meals throughout the day.   Try to identify foods and beverages that make your symptoms worse, and avoid these.   Avoid tight clothing.   Do not exercise right after eating.  SEEK IMMEDIATE MEDICAL CARE IF YOU:  Have severe chest pain that goes down your arm, or into your jaw or neck.   Feel sweaty, dizzy, or lightheaded.   Are short of breath.   Throw up (vomit) blood.   Have difficulty or pain with swallowing.   Have bloody or black, tarry stools.   Have bouts of heartburn more than three times a week for more than two weeks.  Document Released: 01/04/2009 Document Re-Released: 11/12/2009 [facility] Patient Information 2011 [place], [place].Heartburn Heartburn is a painful, burning sensation in the chest. It may feel worse in certain positions, such as lying down or bending over. It is caused by stomach acid backing up into the tube that carries food from the mouth down to the stomach (lower esophagus).   CAUSES A number of conditions can cause or worsen heartburn, including:    Pregnancy.   Being overweight (obesity).   A condition called hiatal hernia, in which part or all of the stomach is moved up into the chest through a weakness in the diaphragm muscle.   Alcohol.   Exercise.  Eating just before going to bed.   Overeating.   Medications, including:   Nonsteroidal anti-inflammatory drugs, such as ibuprofen and naproxen.   Aspirin.   Some blood pressure medicines, including beta-blockers, calcium channel blockers, and alpha-blockers.   Nitrates (used to  treat angina).   The asthma medication Theophylline.   Certain sedative drugs.   Heartburn may be worse after eating certain foods. These heartburn-causing foods are different for different people, but may include:   Peppers.   Chocolate.   Coffee.   High-fat foods, including fried foods.   Spicy foods.   Garlic, onions.   Citrus fruits, including oranges, grapefruit, lemons and limes.   Food containing tomatoes or tomato products.   Mint.   Carbonated beverages.   Vinegar.  SYMPTOMS   Symptoms may last for a few minutes or a few hours, and can include:  Burning pain in the chest or lower throat.   Bitter taste in the mouth.   Coughing.  DIAGNOSIS If the usual treatments for heartburn do not improve your symptoms, then tests may be done to see if there is another condition present. Possible tests may include:  X-rays.   Endoscopy. This is when a tube with a light and a camera on the end is used to examine the esophagus and the stomach.   Blood, breath, or stool tests may be used to check for bacteria that cause ulcers.  TREATMENT There are a number of non-prescription medicines used to treat heartburn, including:  Antacids.   Acid reducers (also called H-2 blockers).   Proton-pump inhibitors.  HOME CARE INSTRUCTIONS  Raise the head of your bed by putting blocks under the legs.   Eat 2-3 hours before going to bed.   Stop smoking.   Try to reach and maintain a healthy weight.   Do not eat just a few very large meals. Instead, eat many smaller meals throughout the day.   Try to identify foods and beverages that make your symptoms worse, and avoid these.   Avoid tight clothing.   Do not exercise right after eating.  SEEK IMMEDIATE MEDICAL CARE IF YOU:  Have severe chest pain that goes down your arm, or into your jaw or neck.   Feel sweaty, dizzy, or lightheaded.   Are short of breath.   Throw up (vomit) blood.   Have difficulty or pain with  swallowing.   Have bloody or black, tarry stools.   Have bouts of heartburn more than three times a week for more than two weeks.  Document Released: 01/04/2009 Document Re-Released: 11/12/2009 [facility] Patient Information 2011 [place], [place].Heartburn Heartburn is a painful, burning sensation in the chest. It may feel worse in certain positions, such as lying down or bending over. It is caused by stomach acid backing up into the tube that carries food from the mouth down to the stomach (lower esophagus).   CAUSES A number of conditions can cause or worsen heartburn, including:    Pregnancy.   Being overweight (obesity).   A condition called hiatal hernia, in which part or all of the stomach is moved up into the chest through a weakness in the diaphragm muscle.   Alcohol.   Exercise.   Eating just before going to bed.   Overeating.   Medications, including:   Nonsteroidal anti-inflammatory drugs, such as ibuprofen and naproxen.   Aspirin.   Some blood pressure medicines, including beta-blockers, calcium channel blockers, and alpha-blockers.   Nitrates (used to treat angina).  The asthma medication Theophylline.   Certain sedative drugs.   Heartburn may be worse after eating certain foods. These heartburn-causing foods are different for different people, but may include:   Peppers.   Chocolate.   Coffee.   High-fat foods, including fried foods.   Spicy foods.   Garlic, onions.   Citrus fruits, including oranges, grapefruit, lemons and limes.   Food containing tomatoes or tomato products.   Mint.   Carbonated beverages.   Vinegar.  SYMPTOMS   Symptoms may last for a few minutes or a few hours, and can include:  Burning pain in the chest or lower throat.   Bitter taste in the mouth.   Coughing.  DIAGNOSIS If the usual treatments for heartburn do not improve your symptoms, then tests may be done to see if there is another condition present.  Possible tests may include:  X-rays.   Endoscopy. This is when a tube with a light and a camera on the end is used to examine the esophagus and the stomach.   Blood, breath, or stool tests may be used to check for bacteria that cause ulcers.  TREATMENT There are a number of non-prescription medicines used to treat heartburn, including:  Antacids.   Acid reducers (also called H-2 blockers).   Proton-pump inhibitors.  HOME CARE INSTRUCTIONS  Raise the head of your bed by putting blocks under the legs.   Eat 2-3 hours before going to bed.   Stop smoking.   Try to reach and maintain a healthy weight.   Do not eat just a few very large meals. Instead, eat many smaller meals throughout the day.   Try to identify foods and beverages that make your symptoms worse, and avoid these.   Avoid tight clothing.   Do not exercise right after eating.  SEEK IMMEDIATE MEDICAL CARE IF YOU:  Have severe chest pain that goes down your arm, or into your jaw or neck.   Feel sweaty, dizzy, or lightheaded.   Are short of breath.   Throw up (vomit) blood.   Have difficulty or pain with swallowing.   Have bloody or black, tarry stools.   Have bouts of heartburn more than three times a week for more than two weeks.  Document Released: 01/04/2009 Document Re-Released: 11/12/2009 [facility] Patient Information 2011 [place], [place].Heartburn Heartburn is a painful, burning sensation in the chest. It may feel worse in certain positions, such as lying down or bending over. It is caused by stomach acid backing up into the tube that carries food from the mouth down to the stomach (lower esophagus).   CAUSES A number of conditions can cause or worsen heartburn, including:    Pregnancy.   Being overweight (obesity).   A condition called hiatal hernia, in which part or all of the stomach is moved up into the chest through a weakness in the diaphragm muscle.   Alcohol.   Exercise.   Eating just  before going to bed.   Overeating.   Medications, including:   Nonsteroidal anti-inflammatory drugs, such as ibuprofen and naproxen.   Aspirin.   Some blood pressure medicines, including beta-blockers, calcium channel blockers, and alpha-blockers.   Nitrates (used to treat angina).   The asthma medication Theophylline.   Certain sedative drugs.   Heartburn may be worse after eating certain foods. These heartburn-causing foods are different for different people, but may include:   Peppers.   Chocolate.   Coffee.   High-fat foods, including fried foods.  Spicy foods.   Garlic, onions.   Citrus fruits, including oranges, grapefruit, lemons and limes.   Food containing tomatoes or tomato products.   Mint.   Carbonated beverages.   Vinegar.  SYMPTOMS   Symptoms may last for a few minutes or a few hours, and can include:  Burning pain in the chest or lower throat.   Bitter taste in the mouth.   Coughing.  DIAGNOSIS If the usual treatments for heartburn do not improve your symptoms, then tests may be done to see if there is another condition present. Possible tests may include:  X-rays.   Endoscopy. This is when a tube with a light and a camera on the end is used to examine the esophagus and the stomach.   Blood, breath, or stool tests may be used to check for bacteria that cause ulcers.  TREATMENT There are a number of non-prescription medicines used to treat heartburn, including:  Antacids.   Acid reducers (also called H-2 blockers).   Proton-pump inhibitors.  HOME CARE INSTRUCTIONS  Raise the head of your bed by putting blocks under the legs.   Eat 2-3 hours before going to bed.   Stop smoking.   Try to reach and maintain a healthy weight.   Do not eat just a few very large meals. Instead, eat many smaller meals throughout the day.   Try to identify foods and beverages that make your symptoms worse, and avoid these.   Avoid tight clothing.     Do not exercise right after eating.  SEEK IMMEDIATE MEDICAL CARE IF YOU:  Have severe chest pain that goes down your arm, or into your jaw or neck.   Feel sweaty, dizzy, or lightheaded.   Are short of breath.   Throw up (vomit) blood.   Have difficulty or pain with swallowing.   Have bloody or black, tarry stools.   Have bouts of heartburn more than three times a week for more than two weeks.  Document Released: 01/04/2009 Document Re-Released: 11/12/2009 [facility] Patient Information 2011 [place], [place].Heartburn Heartburn is a painful, burning sensation in the chest. It may feel worse in certain positions, such as lying down or bending over. It is caused by stomach acid backing up into the tube that carries food from the mouth down to the stomach (lower esophagus).   CAUSES A number of conditions can cause or worsen heartburn, including:    Pregnancy.   Being overweight (obesity).   A condition called hiatal hernia, in which part or all of the stomach is moved up into the chest through a weakness in the diaphragm muscle.   Alcohol.   Exercise.   Eating just before going to bed.   Overeating.   Medications, including:   Nonsteroidal anti-inflammatory drugs, such as ibuprofen and naproxen.   Aspirin.   Some blood pressure medicines, including beta-blockers, calcium channel blockers, and alpha-blockers.   Nitrates (used to treat angina).   The asthma medication Theophylline.   Certain sedative drugs.   Heartburn may be worse after eating certain foods. These heartburn-causing foods are different for different people, but may include:   Peppers.   Chocolate.   Coffee.   High-fat foods, including fried foods.   Spicy foods.   Garlic, onions.   Citrus fruits, including oranges, grapefruit, lemons and limes.   Food containing tomatoes or tomato products.   Mint.   Carbonated beverages.   Vinegar.  SYMPTOMS   Symptoms may last for a few minutes  or a  few hours, and can include:  Burning pain in the chest or lower throat.   Bitter taste in the mouth.   Coughing.  DIAGNOSIS If the usual treatments for heartburn do not improve your symptoms, then tests may be done to see if there is another condition present. Possible tests may include:  X-rays.   Endoscopy. This is when a tube with a light and a camera on the end is used to examine the esophagus and the stomach.   Blood, breath, or stool tests may be used to check for bacteria that cause ulcers.  TREATMENT There are a number of non-prescription medicines used to treat heartburn, including:  Antacids.   Acid reducers (also called H-2 blockers).   Proton-pump inhibitors.  HOME CARE INSTRUCTIONS  Raise the head of your bed by putting blocks under the legs.   Eat 2-3 hours before going to bed.   Stop smoking.   Try to reach and maintain a healthy weight.   Do not eat just a few very large meals. Instead, eat many smaller meals throughout the day.   Try to identify foods and beverages that make your symptoms worse, and avoid these.   Avoid tight clothing.   Do not exercise right after eating.  SEEK IMMEDIATE MEDICAL CARE IF YOU:  Have severe chest pain that goes down your arm, or into your jaw or neck.   Feel sweaty, dizzy, or lightheaded.   Are short of breath.   Throw up (vomit) blood.   Have difficulty or pain with swallowing.   Have bloody or black, tarry stools.   Have bouts of heartburn more than three times a week for more than two weeks.  Document Released: 01/04/2009 Document Re-Released: 11/12/2009 [facility] Patient Information 2011 [place], [place].Heartburn Heartburn is a painful, burning sensation in the chest. It may feel worse in certain positions, such as lying down or bending over. It is caused by stomach acid backing up into the tube that carries food from the mouth down to the stomach (lower esophagus).   CAUSES A number of conditions can  cause or worsen heartburn, including:    Pregnancy.   Being overweight (obesity).   A condition called hiatal hernia, in which part or all of the stomach is moved up into the chest through a weakness in the diaphragm muscle.   Alcohol.   Exercise.   Eating just before going to bed.   Overeating.   Medications, including:   Nonsteroidal anti-inflammatory drugs, such as ibuprofen and naproxen.   Aspirin.   Some blood pressure medicines, including beta-blockers, calcium channel blockers, and alpha-blockers.   Nitrates (used to treat angina).   The asthma medication Theophylline.   Certain sedative drugs.   Heartburn may be worse after eating certain foods. These heartburn-causing foods are different for different people, but may include:   Peppers.   Chocolate.   Coffee.   High-fat foods, including fried foods.   Spicy foods.   Garlic, onions.   Citrus fruits, including oranges, grapefruit, lemons and limes.   Food containing tomatoes or tomato products.   Mint.   Carbonated beverages.   Vinegar.  SYMPTOMS   Symptoms may last for a few minutes or a few hours, and can include:  Burning pain in the chest or lower throat.   Bitter taste in the mouth.   Coughing.  DIAGNOSIS If the usual treatments for heartburn do not improve your symptoms, then tests may be done to see if there is another condition  present. Possible tests may include:  X-rays.   Endoscopy. This is when a tube with a light and a camera on the end is used to examine the esophagus and the stomach.   Blood, breath, or stool tests may be used to check for bacteria that cause ulcers.  TREATMENT There are a number of non-prescription medicines used to treat heartburn, including:  Antacids.   Acid reducers (also called H-2 blockers).   Proton-pump inhibitors.  HOME CARE INSTRUCTIONS  Raise the head of your bed by putting blocks under the legs.   Eat 2-3 hours before going to bed.    Stop smoking.   Try to reach and maintain a healthy weight.   Do not eat just a few very large meals. Instead, eat many smaller meals throughout the day.   Try to identify foods and beverages that make your symptoms worse, and avoid these.   Avoid tight clothing.   Do not exercise right after eating.  SEEK IMMEDIATE MEDICAL CARE IF YOU:  Have severe chest pain that goes down your arm, or into your jaw or neck.   Feel sweaty, dizzy, or lightheaded.   Are short of breath.   Throw up (vomit) blood.   Have difficulty or pain with swallowing.   Have bloody or black, tarry stools.   Have bouts of heartburn more than three times a week for more than two weeks.  Document Released: 01/04/2009 Document Re-Released: 11/12/2009 [facility] Patient Information 2011 [place], [place].

## 2011-08-05 ENCOUNTER — Ambulatory Visit: Payer: 59 | Admitting: Internal Medicine

## 2011-08-05 ENCOUNTER — Telehealth: Payer: Self-pay

## 2011-08-05 DIAGNOSIS — Z0289 Encounter for other administrative examinations: Secondary | ICD-10-CM

## 2011-08-05 NOTE — Telephone Encounter (Signed)
Attempt to call - VM - lmtcb - ncns for appt made at noon - for 315pm , concerned because it was appt for 'double vision' .

## 2011-11-25 ENCOUNTER — Ambulatory Visit (INDEPENDENT_AMBULATORY_CARE_PROVIDER_SITE_OTHER): Payer: 59 | Admitting: Internal Medicine

## 2011-11-25 ENCOUNTER — Encounter: Payer: Self-pay | Admitting: Internal Medicine

## 2011-11-25 DIAGNOSIS — R109 Unspecified abdominal pain: Secondary | ICD-10-CM

## 2011-11-25 DIAGNOSIS — R091 Pleurisy: Secondary | ICD-10-CM

## 2011-11-25 LAB — POCT URINALYSIS DIPSTICK
Bilirubin, UA: NEGATIVE
Glucose, UA: NEGATIVE
Ketones, UA: NEGATIVE
Leukocytes, UA: NEGATIVE

## 2011-11-25 NOTE — Patient Instructions (Addendum)
Take Aleve 200 mg twice daily for pain or swelling  Call or return to clinic prn if these symptoms worsen or fail to improve as anticipated. Pleurisy Pleurisy is an inflammation and swelling of the lining of the lungs. It usually is the result of an underlying infection or other disease. Because of this inflammation, it hurts to breathe. It is aggravated by coughing or deep breathing. The primary goal in treating pleurisy is to diagnose and treat the condition that caused it.   HOME CARE INSTRUCTIONS    Only take over-the-counter or prescription medicines for pain, discomfort, or fever as directed by your caregiver.   If medications which kill germs (antibiotics) were prescribed, take the entire course. Even if you are feeling better, you need to take them.   Use a cool mist vaporizer to help loosen secretions. This is so the secretions can be coughed up more easily.  SEEK MEDICAL CARE IF:    Your pain is not controlled with medication or is increasing.   You have an increase inpus like (purulent) secretions brought up with coughing.  SEEK IMMEDIATE MEDICAL CARE IF:    You have blue or dark lips, fingernails, or toenails.   You begin coughing up blood.   You have increased difficulty breathing.   You have continuing pain unrelieved by medicine or lasting more than 1 week.   You have pain that radiates into your neck, arms, or jaw.   You develop increased shortness of breath or wheezing.   You develop a fever, rash, vomiting, fainting, or other serious complaints.  Document Released: 08/18/2005 Document Revised: 08/07/2011 Document Reviewed: 03/19/2007 [facility] Patient Information 2012 [place], [place].

## 2011-11-25 NOTE — Progress Notes (Signed)
  Subjective:    Patient ID: Sean Wu, male    DOB: June 30, 1971, 41 y.o.   MRN: [id number]  HPI  41 year old patient who presents with a three-day history of left upper quadrant abdominal pain. This began in the afternoon and initially was more in the left flank area it has migrated somewhat to the left upper quadrant. Pain is intermittent and has become somewhat more severe. Even when pain free the left upper quadrant pain is aggravated by cough and deep inspiration he has chronic nasal stuffiness which is unchanged denies any chest pain hemoptysis shortness of breath. No change in his bowel habits. No history of renal stone disease ; denies any hematuria urinary frequency or urgency    Review of Systems  Constitutional: Negative for fever, chills, appetite change and fatigue.  HENT: Negative for hearing loss, ear pain, congestion, sore throat, trouble swallowing, neck stiffness, dental problem, voice change and tinnitus.   Eyes: Negative for pain, discharge and visual disturbance.  Respiratory: Negative for cough, chest tightness, wheezing and stridor.   Cardiovascular: Negative for chest pain, palpitations and leg swelling.  Gastrointestinal: Positive for abdominal pain. Negative for nausea, vomiting, diarrhea, constipation, blood in stool and abdominal distention.  Genitourinary: Negative for urgency, hematuria, flank pain, discharge, difficulty urinating and genital sores.  Musculoskeletal: Negative for myalgias, back pain, joint swelling, arthralgias and gait problem.  Skin: Negative for rash.  Neurological: Negative for dizziness, syncope, speech difficulty, weakness, numbness and headaches.  Hematological: Negative for adenopathy. Does not bruise/bleed easily.  Psychiatric/Behavioral: Negative for behavioral problems and dysphoric mood. The patient is not nervous/anxious.        Objective:   Physical Exam  Pulmonary/Chest: Effort normal and breath sounds normal. No respiratory  distress. He has no wheezes. He has no rales. He exhibits no tenderness.       No rub  Oxygen saturation 98%  Abdominal: Soft. Bowel sounds are normal. He exhibits no distension. There is tenderness. There is no rebound and no guarding.       There is really no tenderness to palpation. Discomfort is aggravated somewhat by taking a deep breath in the left upper quadrant area. No organomegaly or masses. Bowel sounds were normal. No tenderness over the left anterolateral chest wall region. No CVA tenderness  Musculoskeletal: He exhibits no edema and no tenderness.          Assessment & Plan:   Left flank and left upper quadrant pain. Rule out renal stone rule out pleurisy. We'll check a UA we'll treat with anti-inflammatory medication  Urinalysis was reviewed and was normal without hematuria We'll place on anti-inflammatory medications and observed. A d-dimer will be checked although low suspicion for pulmonary embolism

## 2011-11-26 ENCOUNTER — Other Ambulatory Visit: Payer: Self-pay | Admitting: Internal Medicine

## 2012-01-19 ENCOUNTER — Other Ambulatory Visit: Payer: Self-pay | Admitting: Internal Medicine

## 2012-01-21 ENCOUNTER — Encounter: Payer: Self-pay | Admitting: Internal Medicine

## 2012-01-21 ENCOUNTER — Ambulatory Visit (INDEPENDENT_AMBULATORY_CARE_PROVIDER_SITE_OTHER): Payer: 59 | Admitting: Internal Medicine

## 2012-01-21 VITALS — BP 120/80 | Wt 211.0 lb

## 2012-01-21 DIAGNOSIS — C4441 Basal cell carcinoma of skin of scalp and neck: Secondary | ICD-10-CM

## 2012-01-21 DIAGNOSIS — L02413 Cutaneous abscess of right upper limb: Secondary | ICD-10-CM

## 2012-01-21 DIAGNOSIS — IMO0002 Reserved for concepts with insufficient information to code with codable children: Secondary | ICD-10-CM

## 2012-01-21 NOTE — Progress Notes (Signed)
  Subjective:    Patient ID: Sean Wu, male    DOB: 11-Sep-1970, 41 y.o.   MRN: [id number]  HPI 41 year old patient who has a history of an infected sebaceous cyst involving his right upper outer arm. This has been treated with antibiotic therapy in the past for the past several days he has developed increasing pain and redness.. No drainage or fever.  Review of Systems     Objective:   Physical Exam        Assessment & Plan:  Infected sebaceous cyst right upper outer arm. After local anesthesia and prepped this area was I&D. Antibiotic ointment applied and the wound dressed. Wound care discussed

## 2012-01-21 NOTE — Patient Instructions (Signed)
Dermatology referral as discussed  Keep wound clean and dry and change bandage daily. Call if you develop any worsening redness or drainage

## 2012-07-06 ENCOUNTER — Ambulatory Visit (INDEPENDENT_AMBULATORY_CARE_PROVIDER_SITE_OTHER): Payer: 59 | Admitting: Internal Medicine

## 2012-07-06 ENCOUNTER — Encounter: Payer: Self-pay | Admitting: Internal Medicine

## 2012-07-06 VITALS — BP 110/80 | Temp 98.3°F | Wt 215.0 lb

## 2012-07-06 DIAGNOSIS — M549 Dorsalgia, unspecified: Secondary | ICD-10-CM

## 2012-07-06 MED ORDER — METHYLPREDNISOLONE ACETATE 80 MG/ML IJ SUSP
80.0000 mg | Freq: Once | INTRAMUSCULAR | Status: AC
  Administered 2012-07-06: 80 mg via INTRAMUSCULAR

## 2012-07-06 MED ORDER — HYDROCODONE-ACETAMINOPHEN 5-500 MG PO TABS: 1.0000 | ORAL_TABLET | Freq: Three times a day (TID) | ORAL | Status: DC | PRN

## 2012-07-06 NOTE — Patient Instructions (Signed)
Naprelan 500   One twice daily  You  may move around, but avoid painful motions and activities.  Apply ice to the sore area for 15 to 20 minutes 3 or 4 times daily for the next two to 3 days.

## 2012-07-06 NOTE — Progress Notes (Signed)
  Subjective:    Patient ID: Sean Wu, male    DOB: 24-Jul-1971, 41 y.o.   MRN: [id number]  HPI  41 year old patient who presents today with a complaint of back pain. Approximately 90 minutes ago the patient states to pick up an item he dropped and when he straightened he had acute pain in the left posterior lateral mid back area. He is comfortable at rest but has pain with movement.    Review of Systems  Musculoskeletal: Positive for back pain.       Objective:   Physical Exam  Constitutional: He appears well-developed and well-nourished. No distress.  Pulmonary/Chest: He exhibits tenderness.       Tenderness along the left posterolateral chest wall          Assessment & Plan:   Chest wall pain. Will treat with Depo-Medrol. Samples of naproxen dispense will apply ice for the next 24 hours. He will call if unimproved

## 2012-07-19 ENCOUNTER — Ambulatory Visit (INDEPENDENT_AMBULATORY_CARE_PROVIDER_SITE_OTHER): Payer: 59 | Admitting: Family Medicine

## 2012-07-19 VITALS — BP 104/76 | HR 98 | Temp 98.7°F | Wt 209.0 lb

## 2012-07-19 DIAGNOSIS — J329 Chronic sinusitis, unspecified: Secondary | ICD-10-CM

## 2012-07-19 DIAGNOSIS — R110 Nausea: Secondary | ICD-10-CM

## 2012-07-19 MED ORDER — ONDANSETRON HCL 4 MG PO TABS: 4.0000 mg | ORAL_TABLET | Freq: Three times a day (TID) | ORAL | Status: DC | PRN

## 2012-07-19 MED ORDER — AMOXICILLIN-POT CLAVULANATE 875-125 MG PO TABS: 1.0000 | ORAL_TABLET | Freq: Two times a day (BID) | ORAL | Status: DC

## 2012-07-19 NOTE — Patient Instructions (Addendum)
INSTRUCTIONS FOR UPPER RESPIRATORY INFECTION:  -plenty of rest and fluids  -nasal saline wash 2-3 times daily (use prepackaged nasal saline or bottled/distilled water if making your own)   -can use tylenol (1000mg  up to 3 times a day) or ibuprofen (400mg  2-3x daily) as directed for aches and sorethroat  -in the winter time, using a humidifier at night is helpful (please follow cleaning instructions)  -if you are taking a cough medication - use only as directed, may also try a teaspoon of honey to coat the throat and throat lozenges  -for sore throat, salt water gargles can help  -follow up with your doctor in week or sooner if worsening

## 2012-07-19 NOTE — Progress Notes (Signed)
Chief Complaint  Patient presents with  . Nausea    Ongoing for 1-2 wks.  . Headache    HPI:  Headache: -started about 1.5 weeks ago -whole head hurts, pounding, also has nausea, cough, tooth pain, nasal congestion, dry eyes, diarrhea off and on  -allergies have been bad -has history of headaches, lasts for a day or two and then goes away -has a previous diagnosis of migraines - used to take medications that knocked him out -takes ibuprofen a few times per month for headaches -denies: fevers, chills, weight loss, vision changes, vomiting, SOB, weakness  ROS: See pertinent positives and negatives per HPI.  Past Medical History  Diagnosis Date  . TESTICULAR HYPOFUNCTION 06/12/2009  . SMOKER 06/12/2009  . DEPRESSION 05/10/2009  . SLEEP APNEA, OBSTRUCTIVE 06/12/2009  . ALLERGIC RHINITIS 05/10/2009  . GERD 05/10/2009  . WEIGHT GAIN 05/10/2009  . LIBIDO, DECREASED 05/10/2009  . Osgood-Schlatter's disease     Family History  Problem Relation Age of Onset  . Heart disease Maternal Grandfather     History   Social History  . Marital Status: Married    Spouse Name: N/A    Number of Children: N/A  . Years of Education: N/A   Social History Main Topics  . Smoking status: Current Every Day Smoker -- 0.5 packs/day    Types: Cigarettes  . Smokeless tobacco: Former Neurosurgeon  . Alcohol Use: Yes  . Drug Use: No  . Sexually Active: Not on file   Other Topics Concern  . Not on file   Social History Narrative  . No narrative on file    Current outpatient prescriptions:ALPRAZolam (XANAX) 1 MG tablet, , Disp: , Rfl: ;  Asenapine Maleate (SAPHRIS) 10 MG SUBL, Place 2 tablets under the tongue daily.  , Disp: , Rfl: ;  [facility] OMEPRAZOLE 20 MG TBEC, TAKE 1 TABLET TWICE A DAY. DO NOT EAT FOR 1 HOUR AFTER TAKING THE MEDICATION, Disp: 42 tablet, Rfl: 3 diphenoxylate-atropine (LOMOTIL) 2.5-0.025 MG per tablet, Take 2 tablets by mouth. 2 tablets initially then 1 every 4 hrs prn , Disp: , Rfl: ;   lamoTRIgine (LAMICTAL) 150 MG tablet, Take 150 mg by mouth daily.  , Disp: , Rfl: ;  testosterone (ANDROGEL) 50 MG/5GM GEL, Place 5 g onto the skin daily., Disp: 90 Package, Rfl: 3;  VIAGRA 100 MG tablet, TAKE 1 TABLET AS NEEDED 1 HOUR BEFORE INTERCOURSE, Disp: 12 tablet, Rfl: 2 amoxicillin-clavulanate (AUGMENTIN) 875-125 MG per tablet, Take 1 tablet by mouth 2 (two) times daily., Disp: 20 tablet, Rfl: 0;  ondansetron (ZOFRAN) 4 MG tablet, Take 1 tablet (4 mg total) by mouth every 8 (eight) hours as needed for nausea., Disp: 20 tablet, Rfl: 0;  [DISCONTINUED] omeprazole (PRILOSEC OTC) 20 MG tablet, 1 tablet twice daily. Do not eat for one hour after ingestion of the medication, Disp: 60 tablet, Rfl: 11  EXAM:  Filed Vitals:   07/19/12 1405  BP: 104/76  Pulse: 98  Temp: 98.7 F (37.1 C)    There is no height on file to calculate BMI.  GENERAL: vitals reviewed and listed above, alert, oriented, appears well hydrated and in no acute distress  HEENT: atraumatic, conjunttiva clear, no obvious abnormalities on inspection of external nose and ears, normal ear canals and TMs, white nasal discharge and PND   NECK: no obvious masses on inspection  LUNGS: clear to auscultation bilaterally, no wheezes, rales or rhonchi, good air movement  CV: HRRR, no peripheral edema  MS: moves  all extremities without noticeable abnormality  NEURO: CN II-XII grossly intact, normal movements of all extremities, PERRLA  PSYCH: pleasant and cooperative, no obvious depression or anxiety  ASSESSMENT AND PLAN:  Discussed the following assessment and plan:  1. Sinusitis  amoxicillin-clavulanate (AUGMENTIN) 875-125 MG per tablet  2. Nausea  ondansetron (ZOFRAN) 4 MG tablet   -Hx and exam seem to indicate sinuitis, pt reports sinusitis in the past - will tx and has follow up with PCP and strict return precuations to ensure resolved -discussed adverse effects of medication -Patient advised to return or notify a  doctor immediately if symptoms worsen or persist or new concerns arise.  There are no Patient Instructions on file for this visit.   Kriste Basque R.

## 2012-07-20 ENCOUNTER — Ambulatory Visit: Payer: 59 | Admitting: Family Medicine

## 2012-07-26 ENCOUNTER — Ambulatory Visit: Payer: 59 | Admitting: Internal Medicine

## 2012-07-26 DIAGNOSIS — Z0289 Encounter for other administrative examinations: Secondary | ICD-10-CM

## 2012-08-19 ENCOUNTER — Other Ambulatory Visit: Payer: Self-pay | Admitting: Internal Medicine

## 2012-09-01 HISTORY — PX: COLONOSCOPY: SHX174

## 2012-09-09 ENCOUNTER — Other Ambulatory Visit: Payer: Self-pay | Admitting: *Deleted

## 2012-09-09 MED ORDER — SILDENAFIL CITRATE 100 MG PO TABS: 100.0000 mg | ORAL_TABLET | ORAL | Status: DC | PRN

## 2012-09-17 ENCOUNTER — Telehealth: Payer: Self-pay | Admitting: Internal Medicine

## 2012-09-17 NOTE — Telephone Encounter (Signed)
Patient left a message, irregular bowel movements, nausea, and abdominal pain,  Call returned, no answer.  Left a message for patient returning his call.

## 2012-09-20 ENCOUNTER — Ambulatory Visit: Payer: 59 | Admitting: Internal Medicine

## 2012-09-20 DIAGNOSIS — Z0289 Encounter for other administrative examinations: Secondary | ICD-10-CM

## 2012-10-25 ENCOUNTER — Ambulatory Visit (INDEPENDENT_AMBULATORY_CARE_PROVIDER_SITE_OTHER): Payer: 59 | Admitting: Internal Medicine

## 2012-10-25 ENCOUNTER — Encounter: Payer: Self-pay | Admitting: Internal Medicine

## 2012-10-25 VITALS — BP 138/90 | HR 72 | Temp 98.4°F | Resp 18 | Wt 213.0 lb

## 2012-10-25 DIAGNOSIS — K219 Gastro-esophageal reflux disease without esophagitis: Secondary | ICD-10-CM

## 2012-10-25 DIAGNOSIS — R635 Abnormal weight gain: Secondary | ICD-10-CM

## 2012-10-25 DIAGNOSIS — J309 Allergic rhinitis, unspecified: Secondary | ICD-10-CM

## 2012-10-25 DIAGNOSIS — R195 Other fecal abnormalities: Secondary | ICD-10-CM

## 2012-10-25 LAB — CBC WITH DIFFERENTIAL/PLATELET
Basophils Relative: 0.7 % (ref 0.0–3.0)
Eosinophils Absolute: 0.1 10*3/uL (ref 0.0–0.7)
Hemoglobin: 17.3 g/dL — ABNORMAL HIGH (ref 13.0–17.0)
Lymphs Abs: 2.1 10*3/uL (ref 0.7–4.0)
MCHC: 34.3 g/dL (ref 30.0–36.0)
MCV: 90 fl (ref 78.0–100.0)
Monocytes Absolute: 0.6 10*3/uL (ref 0.1–1.0)
Neutro Abs: 4.3 10*3/uL (ref 1.4–7.7)
Neutrophils Relative %: 60 % (ref 43.0–77.0)
RBC: 5.62 Mil/uL (ref 4.22–5.81)

## 2012-10-25 MED ORDER — METOCLOPRAMIDE HCL 10 MG PO TABS: ORAL_TABLET | ORAL | Status: DC

## 2012-10-25 MED ORDER — DEXLANSOPRAZOLE 60 MG PO CPDR: 60.0000 mg | DELAYED_RELEASE_CAPSULE | Freq: Every day | ORAL | Status: DC

## 2012-10-25 NOTE — Progress Notes (Signed)
Subjective:    Patient ID: Sean Wu, male    DOB: 08-16-1971, 42 y.o.   MRN: [id number]  HPI  Wt Readings from Last 3 Encounters:  10/25/12 213 lb (96.616 kg)  07/19/12 209 lb (94.802 kg)  07/06/12 215 lb (97.34 kg)    42 year old patient who has a history of chronic gastrointestinal reflux disease. He has been on a twice a day dose of omeprazole chronically. For the past 3 months she has had increasing the bloating discomfort and nausea. He states when he awakes in the morning he feels well with a good appetite the following the his morning breakfast has nausea throughout the day. He describes that increase the gaseousness and at times his abdomen becomes somewhat tight and tense. There is significant nausea but no vomiting. He describes the increasing constipation.  He also complains a of nocturia 3 or 4 times per night as well as slow urinary stream.  Stable medical problems include testosterone deficiency depression OSA and allergic rhinitis.  Past Medical History  Diagnosis Date  . TESTICULAR HYPOFUNCTION 06/12/2009  . SMOKER 06/12/2009  . DEPRESSION 05/10/2009  . SLEEP APNEA, OBSTRUCTIVE 06/12/2009  . ALLERGIC RHINITIS 05/10/2009  . GERD 05/10/2009  . WEIGHT GAIN 05/10/2009  . LIBIDO, DECREASED 05/10/2009  . Osgood-Schlatter's disease     History   Social History  . Marital Status: Married    Spouse Name: N/A    Number of Children: N/A  . Years of Education: N/A   Occupational History  . Not on file.   Social History Main Topics  . Smoking status: Current Every Day Smoker -- 0.50 packs/day    Types: Cigarettes  . Smokeless tobacco: Former Neurosurgeon  . Alcohol Use: Yes  . Drug Use: No  . Sexually Active: Not on file   Other Topics Concern  . Not on file   Social History Narrative  . No narrative on file    Past Surgical History  Procedure Laterality Date  . Lasik      Family History  Problem Relation Age of Onset  . Heart disease Maternal Grandfather      No Known Allergies  Current Outpatient Prescriptions on File Prior to Visit  Medication Sig Dispense Refill  . ALPRAZolam (XANAX) 1 MG tablet       . Asenapine Maleate (SAPHRIS) 10 MG SUBL Place 2 tablets under the tongue daily.        . diphenoxylate-atropine (LOMOTIL) 2.5-0.025 MG per tablet Take 2 tablets by mouth. 2 tablets initially then 1 every 4 hrs prn       . lamoTRIgine (LAMICTAL) 150 MG tablet Take 150 mg by mouth daily.        . ondansetron (ZOFRAN) 4 MG tablet Take 1 tablet (4 mg total) by mouth every 8 (eight) hours as needed for nausea.  20 tablet  0  . sildenafil (VIAGRA) 100 MG tablet Take 1 tablet (100 mg total) by mouth as needed for erectile dysfunction.  12 tablet  2  . testosterone (ANDROGEL) 50 MG/5GM GEL Place 5 g onto the skin daily.  90 Package  3  . [DISCONTINUED] omeprazole (PRILOSEC OTC) 20 MG tablet 1 tablet twice daily. Do not eat for one hour after ingestion of the medication  60 tablet  11   No current facility-administered medications on file prior to visit.    BP 138/90  Pulse 72  Temp(Src) 98.4 F (36.9 C) (Oral)  Resp 18  Wt 213 lb (96.616 kg)  BMI 31.44 kg/m2  SpO2 98%     Review of Systems  Constitutional: Negative for fever, chills, appetite change and fatigue.  HENT: Negative for hearing loss, ear pain, congestion, sore throat, trouble swallowing, neck stiffness, dental problem, voice change and tinnitus.   Eyes: Negative for pain, discharge and visual disturbance.  Respiratory: Negative for cough, chest tightness, wheezing and stridor.   Cardiovascular: Negative for chest pain, palpitations and leg swelling.  Gastrointestinal: Positive for nausea, abdominal pain, constipation and abdominal distention. Negative for vomiting, diarrhea and blood in stool.  Genitourinary: Negative for urgency, hematuria, flank pain, discharge, difficulty urinating and genital sores.  Musculoskeletal: Negative for myalgias, back pain, joint swelling,  arthralgias and gait problem.  Skin: Negative for rash.  Neurological: Negative for dizziness, syncope, speech difficulty, weakness, numbness and headaches.  Hematological: Negative for adenopathy. Does not bruise/bleed easily.  Psychiatric/Behavioral: Negative for behavioral problems and dysphoric mood. The patient is not nervous/anxious.        Objective:   Physical Exam  Constitutional: He is oriented to person, place, and time. He appears well-developed.  HENT:  Head: Normocephalic.  Right Ear: External ear normal.  Left Ear: External ear normal.  Eyes: Conjunctivae and EOM are normal.  Neck: Normal range of motion.  Cardiovascular: Normal rate and normal heart sounds.   Pulmonary/Chest: Breath sounds normal.  Abdominal: Soft. Bowel sounds are normal. He exhibits no distension and no mass. There is no tenderness. There is no rebound and no guarding.  Genitourinary: Guaiac positive stool.  Musculoskeletal: Normal range of motion. He exhibits no edema and no tenderness.  Neurological: He is alert and oriented to person, place, and time.  Psychiatric: He has a normal mood and affect. His behavior is normal.          Assessment & Plan:   Refractory GERD symptoms in spite of medical treatment Hematest positive stool  Will switch to Dexilant and treat with short-term metoclopramide pending  GI evaluation  In view of the above we'll set up for GI consultation  Mild BPH. Will observe at this time

## 2012-10-25 NOTE — Patient Instructions (Signed)
Avoids foods high in acid such as tomatoes citrus juices, and spicy foods.  Avoid eating within two hours of lying down or before exercising.  Do not overheat.  Try smaller more frequent meals.  If symptoms persist, elevate the head of her bed 12 inches while sleeping.  GI consult as discussedDiet for Gastroesophageal Reflux Disease, Adult Reflux (acid reflux) is when acid from your stomach flows up into the esophagus. When acid comes in contact with the esophagus, the acid causes irritation and soreness (inflammation) in the esophagus. When reflux happens often or so severely that it causes damage to the esophagus, it is called gastroesophageal reflux disease (GERD). Nutrition therapy can help ease the discomfort of GERD. FOODS OR DRINKS TO AVOID OR LIMIT  Smoking or chewing tobacco. Nicotine is one of the most potent stimulants to acid production in the gastrointestinal tract.  Caffeinated and decaffeinated coffee and black tea.  Regular or low-calorie carbonated beverages or energy drinks (caffeine-free carbonated beverages are allowed).   Strong spices, such as black pepper, white pepper, red pepper, cayenne, curry powder, and chili powder.  Peppermint or spearmint.  Chocolate.  High-fat foods, including meats and fried foods. Extra added fats including oils, butter, salad dressings, and nuts. Limit these to less than 8 tsp per day.  Fruits and vegetables if they are not tolerated, such as citrus fruits or tomatoes.  Alcohol.  Any food that seems to aggravate your condition. If you have questions regarding your diet, call your caregiver or a registered dietitian. OTHER THINGS THAT MAY HELP GERD INCLUDE:   Eating your meals slowly, in a relaxed setting.  Eating 5 to 6 small meals per day instead of 3 large meals.  Eliminating food for a period of time if it causes distress.  Not lying down until 3 hours after eating a meal.  Keeping the head of your bed raised 6 to 9 inches  (15 to 23 cm) by using a foam wedge or blocks under the legs of the bed. Lying flat may make symptoms worse.  Being physically active. Weight loss may be helpful in reducing reflux in overweight or obese adults.  Wear loose fitting clothing EXAMPLE MEAL PLAN This meal plan is approximately 2,000 calories based on https://www.[facility].org/ meal planning guidelines. Breakfast   cup cooked oatmeal.  1 cup strawberries.  1 cup low-fat milk.  1 oz almonds. Snack  1 cup cucumber slices.  6 oz yogurt (made from low-fat or fat-free milk). Lunch  2 slice whole-wheat bread.  2 oz sliced [place].  2 tsp mayonnaise.  1 cup blueberries.  1 cup snap peas. Snack  6 whole-wheat crackers.  1 oz string cheese. Dinner   cup brown rice.  1 cup mixed veggies.  1 tsp olive oil.  3 oz grilled fish. Document Released: 08/18/2005 Document Revised: 11/10/2011 Document Reviewed: 07/04/2011 [facility] Patient Information 2013 [place], [place].

## 2012-11-01 ENCOUNTER — Encounter: Payer: Self-pay | Admitting: Gastroenterology

## 2012-11-11 ENCOUNTER — Telehealth: Payer: Self-pay | Admitting: Internal Medicine

## 2012-11-11 MED ORDER — TESTOSTERONE 30 MG/ACT TD SOLN: 2.0000 "application " | TRANSDERMAL | Status: DC

## 2012-11-11 NOTE — Telephone Encounter (Signed)
Pt is tired of dealing with androgel. Pt would like to switch to axiron less hassle. [facility] [address]

## 2012-11-11 NOTE — Telephone Encounter (Signed)
Left message on voicemail. Rx for Axiron as requested was faxed to pharmacy.

## 2012-11-11 NOTE — Telephone Encounter (Signed)
Okay  Axiron  60 mg to the axilla every morning

## 2012-11-19 ENCOUNTER — Ambulatory Visit (INDEPENDENT_AMBULATORY_CARE_PROVIDER_SITE_OTHER): Payer: 59 | Admitting: Gastroenterology

## 2012-11-19 ENCOUNTER — Encounter: Payer: Self-pay | Admitting: Gastroenterology

## 2012-11-19 VITALS — BP 126/100 | HR 100 | Ht 68.0 in | Wt 210.0 lb

## 2012-11-19 DIAGNOSIS — K5900 Constipation, unspecified: Secondary | ICD-10-CM

## 2012-11-19 DIAGNOSIS — K921 Melena: Secondary | ICD-10-CM

## 2012-11-19 MED ORDER — MOVIPREP 100 G PO SOLR: 1.0000 | Freq: Once | ORAL | Status: DC

## 2012-11-19 NOTE — Patient Instructions (Addendum)
One of your biggest health concerns is your smoking.  This increases your risk for most cancers and serious cardiovascular diseases such as strokes, heart attacks.  You should try your best to stop.  If you need assistance, please contact your PCP or Smoking Cessation Class at [facility] [phone]) or [facility] Quit-Line (1-800-QUIT-NOW). You will be set up for a colonoscopy (at [facility] with moderate sedation) for blood in stools, constipation. Start taking 2 doses of miralax once daily for your constipation in the meantime.                                               We are excited to introduce MyChart, a new best-in-class service that provides you online access to important information in your electronic medical record. We want to make it easier for you to view your health information - all in one secure location - when and where you need it. We expect MyChart will enhance the quality of care and service we provide.  When you register for MyChart, you can:    View your test results.    Request appointments and receive appointment reminders via email.    Request medication renewals.    View your medical history, allergies, medications and immunizations.    Communicate with your physician's office through a password-protected site.    Conveniently print information such as your medication lists.  To find out if MyChart is right for you, please talk to a member of our clinical staff today. We will gladly answer your questions about this free health and wellness tool.  If you are age 46 or older and want a member of your family to have access to your record, you must provide written consent by completing a proxy form available at our office. Please speak to our clinical staff about guidelines regarding accounts for patients younger than age 26.  As you activate your MyChart account and need any technical assistance, please call the MyChart technical support line at [phone]-CHART  629-320-0246) or email your question to [email or web address]. If you email your question(s), please include your name, a return phone number and the best time to reach you.  If you have non-urgent health-related questions, you can send a message to our office through MyChart at [email or web address]. If you have a medical emergency, call 911.  Thank you for using MyChart as your new health and wellness resource!   MyChart licensed from [employer],  [id number]. Patents Pending.

## 2012-11-19 NOTE — Progress Notes (Signed)
HPI: This is a   very pleasant 42 year old man whom I am meeting for the first time today.   Feels Messed up, bloated.  Difficult to eat the rest of the day.  Constipated.  Full abdomen pains.  Sees blood in his stool  Has had constipation for about 9 months. Will go 4-5 days without a BM, new over past 9 months.  Also very bad nausea.  Can be for weeks at a time.  He does not vomit, but feels like he needs to.  He takes 4 advil, ibuprofen, per day.    Takes benedryl daily.  Has taken dulcolax for constipation, causes watery urgent painful stools  Lost his job, getting out less.  6 months ago.  Has gained about 25-30 pounds.  Was found to be heme positive on rectal exam by PCP   Review of systems: Pertinent positive and negative review of systems were noted in the above HPI section. Complete review of systems was performed and was otherwise normal.    Past Medical History  Diagnosis Date  . TESTICULAR HYPOFUNCTION 06/12/2009  . SMOKER 06/12/2009  . DEPRESSION 05/10/2009  . SLEEP APNEA, OBSTRUCTIVE 06/12/2009  . ALLERGIC RHINITIS 05/10/2009  . GERD 05/10/2009  . WEIGHT GAIN 05/10/2009  . LIBIDO, DECREASED 05/10/2009  . Osgood-Schlatter's disease   . Anxiety   . Chronic headaches     Past Surgical History  Procedure Laterality Date  . Lasik    . Ankle surgery Left     Current Outpatient Prescriptions  Medication Sig Dispense Refill  . ALPRAZolam (XANAX) 1 MG tablet Take 1 mg by mouth 3 (three) times daily.       . Asenapine Maleate (SAPHRIS) 10 MG SUBL Place 2 tablets under the tongue daily.        Marland Kitchen dexlansoprazole (DEXILANT) 60 MG capsule Take 1 capsule (60 mg total) by mouth daily.  30 capsule    . lamoTRIgine (LAMICTAL) 150 MG tablet Take 150 mg by mouth daily.        . sildenafil (VIAGRA) 100 MG tablet Take 1 tablet (100 mg total) by mouth as needed for erectile dysfunction.  12 tablet  2  . Testosterone 30 MG/ACT SOLN Place 2 application onto the skin every  morning.  90 mL  2  . zolpidem (AMBIEN) 10 MG tablet       . [DISCONTINUED] omeprazole (PRILOSEC OTC) 20 MG tablet 1 tablet twice daily. Do not eat for one hour after ingestion of the medication  60 tablet  11   No current facility-administered medications for this visit.    Allergies as of 11/19/2012  . (No Known Allergies)    Family History  Problem Relation Age of Onset  . Heart disease Maternal Grandfather   . Irritable bowel syndrome Father   . Alcoholism Father   . Heart attack Father     History   Social History  . Marital Status: Married    Spouse Name: N/A    Number of Children: 0  . Years of Education: N/A   Occupational History  . sales    Social History Main Topics  . Smoking status: Current Every Day Smoker -- 0.50 packs/day for 15 years    Types: Cigarettes  . Smokeless tobacco: Former Neurosurgeon    Types: Chew    Quit date: 09/01/1998  . Alcohol Use: Yes     Comment: social  . Drug Use: No  . Sexually Active: Not on file   Other Topics  Concern  . Not on file   Social History Narrative  . No narrative on file       Physical Exam: BP 126/100  Pulse 100  Ht 5\' 8"  (1.727 m)  Wt 210 lb (95.255 kg)  BMI 31.94 kg/m2 Constitutional: generally well-appearing Psychiatric: alert and oriented x3 Eyes: extraocular movements intact Mouth: oral pharynx moist, no lesions Neck: supple no lymphadenopathy Cardiovascular: heart regular rate and rhythm Lungs: clear to auscultation bilaterally Abdomen: soft, nontender, nondistended, no obvious ascites, no peritoneal signs, normal bowel sounds Extremities: no lower extremity edema bilaterally Skin: no lesions on visible extremities    Assessment and plan: 42 y.o. male with  change in bowels, fecal occult positive stool, overt blood in stool, constipation, abdominal pains  He is gaining weight and so I suspect his bowel changes and constipation are likely functional. He describes really have to push and  strain a lot to move his bowels and neck and certainly account for his minor rectal bleeding. I would like to proceed with colonoscopy at his soonest convenience to rule out structural causes of his constipation. He will start taking MiraLax 2 doses once daily for now.

## 2012-11-29 ENCOUNTER — Ambulatory Visit (AMBULATORY_SURGERY_CENTER): Payer: 59 | Admitting: Gastroenterology

## 2012-11-29 ENCOUNTER — Encounter: Payer: Self-pay | Admitting: Gastroenterology

## 2012-11-29 VITALS — BP 141/91 | HR 69 | Temp 98.4°F | Resp 19 | Ht 68.0 in | Wt 210.0 lb

## 2012-11-29 DIAGNOSIS — K921 Melena: Secondary | ICD-10-CM

## 2012-11-29 DIAGNOSIS — K5900 Constipation, unspecified: Secondary | ICD-10-CM

## 2012-11-29 MED ORDER — SODIUM CHLORIDE 0.9 % IV SOLN: 500.0000 mL | INTRAVENOUS | Status: DC

## 2012-11-29 NOTE — Op Note (Signed)
[facility] [address] [place], [address]   COLONOSCOPY PROCEDURE REPORT  PATIENT: Sean, Wu  MR#: [id number] BIRTHDATE: Jun 11, 1971 , 41  yrs. old GENDER: Male ENDOSCOPIST: Rachael Fee, MD REFERRED NF:AOZHY Amador Cunas, M.D. PROCEDURE DATE:  11/29/2012 PROCEDURE:   Colonoscopy, diagnostic ASA CLASS:   Class II INDICATIONS:constipation, minor rectal bleeding.Marland Kitchen MEDICATIONS: Fentanyl 100 mcg IV, Versed 9 mg IV, and These medications were titrated to patient response per physician's verbal order  DESCRIPTION OF PROCEDURE:   After the risks benefits and alternatives of the procedure were thoroughly explained, informed consent was obtained.  A digital rectal exam revealed no abnormalities of the rectum.   The LB [id number]  endoscope was introduced through the anus and advanced to the cecum, which was identified by both the appendix and ileocecal valve. No adverse events experienced.   The quality of the prep was good.  The instrument was then slowly withdrawn as the colon was fully examined.   COLON FINDINGS: There were a few small diverticulum in left colon. The examination was otherwise normal.  Retroflexed views revealed no abnormalities. The time to cecum=3 minutes 36 seconds. Withdrawal time=7 minutes 53 seconds.  The scope was withdrawn and the procedure completed. COMPLICATIONS: There were no complications.  ENDOSCOPIC IMPRESSION: There were a few small diverticulum in left colon. The examination was otherwise normal. No polyps or cancers  RECOMMENDATIONS: Continue miralax daily. Colon cancer screening with colonoscopy in 10 years.   eSigned:  Rachael Fee, MD 11/29/2012 3:31 PM

## 2012-11-29 NOTE — Progress Notes (Signed)
No complaints noted in the recovery room. [place]  Patient did not experience any of the following events: a burn prior to discharge; a fall within the facility; wrong site/side/patient/procedure/implant event; or a hospital transfer or hospital admission upon discharge from the facility. (G8907) Patient did not have preoperative order for IV antibiotic SSI prophylaxis. (G8918)  

## 2012-11-29 NOTE — Patient Instructions (Addendum)
One of your biggest health concerns is your smoking.  This increases your risk for most cancers and serious cardiovascular diseases such as strokes, heart attacks.  You should try your best to stop.  If you need assistance, please contact your PCP or Smoking Cessation Class at [facility] [phone]) or [facility] Quit-Line (1-800-QUIT-NOW).   Handouts were given to your care partner on diverticulosis and high fiber diet.  You may resume your current medications today.  Please call if any questions or concerns.    YOU HAD AN ENDOSCOPIC PROCEDURE TODAY AT THE [facility]: Refer to the procedure report that was given to you for any specific questions about what was found during the examination.  If the procedure report does not answer your questions, please call your gastroenterologist to clarify.  If you requested that your care partner not be given the details of your procedure findings, then the procedure report has been included in a sealed envelope for you to review at your convenience later.  YOU SHOULD EXPECT: Some feelings of bloating in the abdomen. Passage of more gas than usual.  Walking can help get rid of the air that was put into your GI tract during the procedure and reduce the bloating. If you had a lower endoscopy (such as a colonoscopy or flexible sigmoidoscopy) you may notice spotting of blood in your stool or on the toilet paper. If you underwent a bowel prep for your procedure, then you may not have a normal bowel movement for a few days.  DIET: Your first meal following the procedure should be a light meal and then it is ok to progress to your normal diet.  A half-sandwich or bowl of soup is an example of a good first meal.  Heavy or fried foods are harder to digest and may make you feel nauseous or bloated.  Likewise meals heavy in dairy and vegetables can cause extra gas to form and this can also increase the bloating.  Drink plenty of fluids but you should avoid  alcoholic beverages for 24 hours.  ACTIVITY: Your care partner should take you home directly after the procedure.  You should plan to take it easy, moving slowly for the rest of the day.  You can resume normal activity the day after the procedure however you should NOT DRIVE or use heavy machinery for 24 hours (because of the sedation medicines used during the test).    SYMPTOMS TO REPORT IMMEDIATELY: A gastroenterologist can be reached at any hour.  During normal business hours, 8:30 AM to 5:00 PM Monday through Friday, call [phone].  After hours and on weekends, please call the GI answering service at [phone] who will take a message and have the physician on call contact you.   Following lower endoscopy (colonoscopy or flexible sigmoidoscopy):  Excessive amounts of blood in the stool  Significant tenderness or worsening of abdominal pains  Swelling of the abdomen that is new, acute  Fever of 100F or higher   FOLLOW UP: If any biopsies were taken you will be contacted by phone or by letter within the next 1-3 weeks.  Call your gastroenterologist if you have not heard about the biopsies in 3 weeks.  Our staff will call the home number listed on your records the next business day following your procedure to check on you and address any questions or concerns that you may have at that time regarding the information given to you following your procedure. This is a  courtesy call and so if there is no answer at the home number and we have not heard from you through the emergency physician on call, we will assume that you have returned to your regular daily activities without incident.  SIGNATURES/CONFIDENTIALITY: You and/or your care partner have signed paperwork which will be entered into your electronic medical record.  These signatures attest to the fact that that the information above on your After Visit Summary has been reviewed and is understood.  Full responsibility of the  confidentiality of this discharge information lies with you and/or your care-partner.

## 2012-11-29 NOTE — Progress Notes (Signed)
Patient awake asking Dr. Christella Hartigan questions regarding constipation.

## 2012-11-30 ENCOUNTER — Telehealth: Payer: Self-pay | Admitting: *Deleted

## 2012-11-30 NOTE — Telephone Encounter (Signed)
Name identifier, left message, follow-up 

## 2012-12-01 ENCOUNTER — Telehealth: Payer: Self-pay | Admitting: Internal Medicine

## 2012-12-01 MED ORDER — DEXLANSOPRAZOLE 60 MG PO CPDR: 60.0000 mg | DELAYED_RELEASE_CAPSULE | Freq: Every day | ORAL | Status: DC

## 2012-12-01 NOTE — Telephone Encounter (Signed)
Pt called back told him Rx is not covered by insurance and wanted to let him know before send Rx. Pt verbalized understanding and stated okay to send Rx. Told him okay will send Rx to pharmacy. Rx sent.

## 2012-12-01 NOTE — Telephone Encounter (Signed)
Pt was given samples of DEXILANT 60 mg. This med is working great and pt would like RX. Pharm: [facility]/ [address]

## 2012-12-01 NOTE — Telephone Encounter (Signed)
Left message on voicemail, Rx for Dexilant will not be covered by insurance.

## 2013-02-28 ENCOUNTER — Encounter: Payer: Self-pay | Admitting: Gastroenterology

## 2013-02-28 ENCOUNTER — Ambulatory Visit (INDEPENDENT_AMBULATORY_CARE_PROVIDER_SITE_OTHER): Payer: 59 | Admitting: Internal Medicine

## 2013-02-28 ENCOUNTER — Encounter: Payer: Self-pay | Admitting: Internal Medicine

## 2013-02-28 ENCOUNTER — Telehealth: Payer: Self-pay

## 2013-02-28 VITALS — BP 140/90 | HR 71 | Temp 98.7°F | Resp 20 | Wt 195.0 lb

## 2013-02-28 DIAGNOSIS — R110 Nausea: Secondary | ICD-10-CM

## 2013-02-28 DIAGNOSIS — R1310 Dysphagia, unspecified: Secondary | ICD-10-CM

## 2013-02-28 DIAGNOSIS — R195 Other fecal abnormalities: Secondary | ICD-10-CM

## 2013-02-28 MED ORDER — PROCHLORPERAZINE MALEATE 10 MG PO TABS: 10.0000 mg | ORAL_TABLET | Freq: Four times a day (QID) | ORAL | Status: DC | PRN

## 2013-02-28 NOTE — Telephone Encounter (Signed)
Message copied by Donata Duff on Mon Feb 28, 2013 11:21 AM ------      Message from: Rachael Fee      Created: Mon Feb 28, 2013 11:16 AM       Cindee Lame,      Thanks, I read your note.  Agree that EGD is a good idea. We'll get it set up.            Emili Mcloughlin,      He needs EGD for nausea, dysphagia, weight loss (LEC, moderate sedation) thanks                  ----- Message -----         From: Gordy Savers, MD         Sent: 02/28/2013  10:54 AM           To: Rachael Fee, MD            Hey,      Probably need to consider EGD to r/o organic etiology;  Patient with worsening nausea, and now with weight loss and solid food dysphagia      Thanks, Cindee Lame       ------

## 2013-02-28 NOTE — Progress Notes (Signed)
Subjective:    Patient ID: Sean Wu, male    DOB: [birth year], 42 y.o.   MRN: [id number]  HPI   42 year old patient who is seen today for followup. The patient was seen by GI approximately 3 months ago and is status post negative colonoscopy. He continues to have abdominal bloating discomfort and a chief complaint of nausea. Over the past 3 months she has lost 15 pounds in weight do to the nausea. For the past month, he also describes some solid food dysphagia. He continues to have significant constipation but does not use the MiraLax regularly. He continues to smoke but has cut down on the tobacco use. He continues to use anti-inflammatory medication but less frequently for headaches  Past Medical History  Diagnosis Date  . TESTICULAR HYPOFUNCTION 06/12/2009  . SMOKER 06/12/2009  . DEPRESSION 05/10/2009  . SLEEP APNEA, OBSTRUCTIVE 06/12/2009  . ALLERGIC RHINITIS 05/10/2009  . GERD 05/10/2009  . WEIGHT GAIN 05/10/2009  . LIBIDO, DECREASED 05/10/2009  . Osgood-Schlatter's disease   . Anxiety   . Chronic headaches     History   Social History  . Marital Status: Married    Spouse Name: N/A    Number of Children: 0  . Years of Education: N/A   Occupational History  . sales    Social History Main Topics  . Smoking status: Current Every Day Smoker -- 0.50 packs/day for 15 years    Types: Cigarettes  . Smokeless tobacco: Former Neurosurgeon    Types: Chew    Quit date: 09/01/1998  . Alcohol Use: Yes     Comment: social  . Drug Use: No  . Sexually Active: Not on file   Other Topics Concern  . Not on file   Social History Narrative  . No narrative on file    Past Surgical History  Procedure Laterality Date  . Lasik    . Ankle surgery Left     Family History  Problem Relation Age of Onset  . Heart disease Maternal Grandfather   . Irritable bowel syndrome Father   . Alcoholism Father   . Heart attack Father     No Known Allergies  Current Outpatient Prescriptions on File  Prior to Visit  Medication Sig Dispense Refill  . ALPRAZolam (XANAX) 1 MG tablet Take 1 mg by mouth 3 (three) times daily.       . Asenapine Maleate (SAPHRIS) 10 MG SUBL Place 2 tablets under the tongue daily.        Marland Kitchen dexlansoprazole (DEXILANT) 60 MG capsule Take 1 capsule (60 mg total) by mouth daily.  30 capsule  2  . lamoTRIgine (LAMICTAL) 150 MG tablet Take 150 mg by mouth daily.        . sildenafil (VIAGRA) 100 MG tablet Take 1 tablet (100 mg total) by mouth as needed for erectile dysfunction.  12 tablet  2  . Testosterone 30 MG/ACT SOLN Place 2 application onto the skin every morning.  90 mL  2  . zolpidem (AMBIEN) 10 MG tablet       . [DISCONTINUED] omeprazole (PRILOSEC OTC) 20 MG tablet 1 tablet twice daily. Do not eat for one hour after ingestion of the medication  60 tablet  11   No current facility-administered medications on file prior to visit.    BP 140/90  Pulse 71  Temp(Src) 98.7 F (37.1 C) (Oral)  Resp 20  Wt 195 lb (88.451 kg)  BMI 29.66 kg/m2  SpO2 98%  Review of Systems  Constitutional: Positive for appetite change and unexpected weight change. Negative for fever, chills and fatigue.  HENT: Negative for hearing loss, ear pain, congestion, sore throat, trouble swallowing, neck stiffness, dental problem, voice change and tinnitus.   Eyes: Negative for pain, discharge and visual disturbance.  Respiratory: Negative for cough, chest tightness, wheezing and stridor.   Cardiovascular: Negative for chest pain, palpitations and leg swelling.  Gastrointestinal: Positive for nausea, abdominal pain, constipation and abdominal distention. Negative for vomiting, diarrhea and blood in stool.  Genitourinary: Negative for urgency, hematuria, flank pain, discharge, difficulty urinating and genital sores.  Musculoskeletal: Negative for myalgias, back pain, joint swelling, arthralgias and gait problem.  Skin: Negative for rash.  Neurological: Negative for dizziness,  syncope, speech difficulty, weakness, numbness and headaches.  Hematological: Negative for adenopathy. Does not bruise/bleed easily.  Psychiatric/Behavioral: Negative for behavioral problems and dysphoric mood. The patient is not nervous/anxious.        Objective:   Physical Exam  Constitutional: He appears well-developed and well-nourished. No distress.  No distress. Weight 195  Abdominal: Soft. Bowel sounds are normal. He exhibits no distension and no mass. There is no tenderness. There is no rebound and no guarding.          Assessment & Plan:   Constipation. Compliance with MiraLax  Discussed;  the patient will take daily or twice a day if needed Nausea. We'll treat symptomatically. The patient has been asked to avoid all anti-inflammatory medications Solid food dysphagia with weight loss.  We'll proceed with EGD History of bipolar depression

## 2013-02-28 NOTE — Telephone Encounter (Signed)
Left message on machine to call back  

## 2013-02-28 NOTE — Patient Instructions (Addendum)
Avoid anti-inflammatory medications such as ibuprofen  Use MiraLax once daily or twice daily to control constipation  Follow GI as discussed  Constipation, Adult Constipation is when a person has fewer than 3 bowel movements a week; has difficulty having a bowel movement; or has stools that are dry, hard, or larger than normal. As people grow older, constipation is more common. If you try to fix constipation with medicines that make you have a bowel movement (laxatives), the problem may get worse. Long-term laxative use may cause the muscles of the colon to become weak. A low-fiber diet, not taking in enough fluids, and taking certain medicines may make constipation worse. CAUSES   Certain medicines, such as antidepressants, pain medicine, iron supplements, antacids, and water pills.   Certain diseases, such as diabetes, irritable bowel syndrome (IBS), thyroid disease, or depression.   Not drinking enough water.   Not eating enough fiber-rich foods.   Stress or travel.  Lack of physical activity or exercise.  Not going to the restroom when there is the urge to have a bowel movement.  Ignoring the urge to have a bowel movement.  Using laxatives too much. SYMPTOMS   Having fewer than 3 bowel movements a week.   Straining to have a bowel movement.   Having hard, dry, or larger than normal stools.   Feeling full or bloated.   Pain in the lower abdomen.  Not feeling relief after having a bowel movement. DIAGNOSIS  Your caregiver will take a medical history and perform a physical exam. Further testing may be done for severe constipation. Some tests may include:   A barium enema X-ray to examine your rectum, colon, and sometimes, your small intestine.  A sigmoidoscopy to examine your lower colon.  A colonoscopy to examine your entire colon. TREATMENT  Treatment will depend on the severity of your constipation and what is causing it. Some dietary treatments include  drinking more fluids and eating more fiber-rich foods. Lifestyle treatments may include regular exercise. If these diet and lifestyle recommendations do not help, your caregiver may recommend taking over-the-counter laxative medicines to help you have bowel movements. Prescription medicines may be prescribed if over-the-counter medicines do not work.  HOME CARE INSTRUCTIONS   Increase dietary fiber in your diet, such as fruits, vegetables, whole grains, and beans. Limit high-fat and processed sugars in your diet, such as [place] fries, hamburgers, cookies, candies, and soda.   A fiber supplement may be added to your diet if you cannot get enough fiber from foods.   Drink enough fluids to keep your urine clear or pale yellow.   Exercise regularly or as directed by your caregiver.   Go to the restroom when you have the urge to go. Do not hold it.  Only take medicines as directed by your caregiver. Do not take other medicines for constipation without talking to your caregiver first. SEEK IMMEDIATE MEDICAL CARE IF:   You have bright red blood in your stool.   Your constipation lasts for more than 4 days or gets worse.   You have abdominal or rectal pain.   You have thin, pencil-like stools.  You have unexplained weight loss. MAKE SURE YOU:   Understand these instructions.  Will watch your condition.  Will get help right away if you are not doing well or get worse. Document Released: 05/16/2004 Document Revised: 11/10/2011 Document Reviewed: 07/22/2011 [facility] Patient Information 2014 [place], [place].

## 2013-02-28 NOTE — Telephone Encounter (Signed)
Pt will be scheduled for EGD and pre visit with the [facility]

## 2013-03-07 ENCOUNTER — Encounter: Payer: Self-pay | Admitting: Gastroenterology

## 2013-03-07 ENCOUNTER — Ambulatory Visit (AMBULATORY_SURGERY_CENTER): Payer: 59 | Admitting: *Deleted

## 2013-03-07 VITALS — Ht 69.0 in | Wt 195.8 lb

## 2013-03-07 DIAGNOSIS — R1310 Dysphagia, unspecified: Secondary | ICD-10-CM

## 2013-03-07 DIAGNOSIS — R109 Unspecified abdominal pain: Secondary | ICD-10-CM

## 2013-03-07 NOTE — Progress Notes (Signed)
No egg or soy allergy. ewm No home 02 use. ewm No problems with past sedation. emw

## 2013-03-16 ENCOUNTER — Encounter: Payer: Self-pay | Admitting: Gastroenterology

## 2013-03-16 ENCOUNTER — Ambulatory Visit (AMBULATORY_SURGERY_CENTER): Payer: 59 | Admitting: Gastroenterology

## 2013-03-16 VITALS — BP 116/72 | HR 82 | Temp 97.8°F | Resp 13 | Ht 69.0 in | Wt 195.0 lb

## 2013-03-16 DIAGNOSIS — K319 Disease of stomach and duodenum, unspecified: Secondary | ICD-10-CM

## 2013-03-16 DIAGNOSIS — R1310 Dysphagia, unspecified: Secondary | ICD-10-CM

## 2013-03-16 DIAGNOSIS — K2990 Gastroduodenitis, unspecified, without bleeding: Secondary | ICD-10-CM

## 2013-03-16 DIAGNOSIS — K2970 Gastritis, unspecified, without bleeding: Secondary | ICD-10-CM

## 2013-03-16 DIAGNOSIS — R109 Unspecified abdominal pain: Secondary | ICD-10-CM

## 2013-03-16 MED ORDER — SODIUM CHLORIDE 0.9 % IV SOLN: 500.0000 mL | INTRAVENOUS | Status: DC

## 2013-03-16 NOTE — Op Note (Signed)
[facility] [address] [place], [address]   ENDOSCOPY PROCEDURE REPORT  PATIENT: Sean Wu, Sean Wu  MR#: [id number] BIRTHDATE: 1970-12-15 , 42  yrs. old GENDER: Male ENDOSCOPIST: Rachael Fee, MD PROCEDURE DATE:  03/16/2013 PROCEDURE:  EGD w/ biopsy ASA CLASS:     Class II INDICATIONS:  intermittent dysphagia, nausea, weight loss. MEDICATIONS: Fentanyl 75 mcg IV, Versed 8 mg IV, and These medications were titrated to patient response per physician's verbal order TOPICAL ANESTHETIC: Cetacaine Spray  DESCRIPTION OF PROCEDURE: After the risks benefits and alternatives of the procedure were thoroughly explained, informed consent was obtained.  The LB [id number] endoscope was introduced through the mouth and advanced to the second portion of the duodenum. Without limitations.  The instrument was slowly withdrawn as the mucosa was fully examined.   There was moderate distal gastritis.  This was biopsied and sent to pathology.  There was a moderate amount of liquid food in stomach without anatomic outlet obstruction.  The examination was otherwise normal.  Retroflexed views revealed no abnormalities.     The scope was then withdrawn from the patient and the procedure completed. COMPLICATIONS: There were no complications.  ENDOSCOPIC IMPRESSION: There was moderate distal gastritis.  This was biopsied and sent to pathology.  There was a moderate amount of liquid food in stomach without anatomic outlet obstruction.  The examination was otherwise normal.  RECOMMENDATIONS: Await pathology.  If biopsies show H.  pylori, you will be started on appropriate antibiotics.  If not, then will consider further testing, ?gastric emptying scan to check for gastroparesis.    eSigned:  Rachael Fee, MD 03/16/2013 10:56 AM   CC: Derryl Harbor, MD

## 2013-03-16 NOTE — Progress Notes (Signed)
Patient did not have preoperative order for IV antibiotic SSI prophylaxis. (G8918)  Patient did not experience any of the following events: a burn prior to discharge; a fall within the facility; wrong site/side/patient/procedure/implant event; or a hospital transfer or hospital admission upon discharge from the facility. (G8907)  

## 2013-03-16 NOTE — Patient Instructions (Addendum)
One of your biggest health concerns is your smoking.  This increases your risk for most cancers and serious cardiovascular diseases such as strokes, heart attacks.  You should try your best to stop.  If you need assistance, please contact your PCP or Smoking Cessation Class at [facility] [phone]) or [place] (1-800-QUIT-NOW).   YOU HAD AN ENDOSCOPIC PROCEDURE TODAY AT [facility]: Refer to the procedure report that was given to you for any specific questions about what was found during the examination.  If the procedure report does not answer your questions, please call your gastroenterologist to clarify.  If you requested that your care partner not be given the details of your procedure findings, then the procedure report has been included in a sealed envelope for you to review at your convenience later.  YOU SHOULD EXPECT: Some feelings of bloating in the abdomen. Passage of more gas than usual.  Walking can help get rid of the air that was put into your GI tract during the procedure and reduce the bloating. If you had a lower endoscopy (such as a colonoscopy or flexible sigmoidoscopy) you may notice spotting of blood in your stool or on the toilet paper. If you underwent a bowel prep for your procedure, then you may not have a normal bowel movement for a few days.  DIET: Your first meal following the procedure should be a light meal and then it is ok to progress to your normal diet.  A half-sandwich or bowl of soup is an example of a good first meal.  Heavy or fried foods are harder to digest and may make you feel nauseous or bloated.  Likewise meals heavy in dairy and vegetables can cause extra gas to form and this can also increase the bloating.  Drink plenty of fluids but you should avoid alcoholic beverages for 24 hours.  ACTIVITY: Your care partner should take you home directly after the procedure.  You should plan to take it easy, moving slowly for the rest of  the day.  You can resume normal activity the day after the procedure however you should NOT DRIVE or use heavy machinery for 24 hours (because of the sedation medicines used during the test).    SYMPTOMS TO REPORT IMMEDIATELY: A gastroenterologist can be reached at any hour.  During normal business hours, 8:30 AM to 5:00 PM Monday through Friday, call [phone].  After hours and on weekends, please call the GI answering service at [phone] who will take a message and have the physician on call contact you.   Following lower endoscopy (colonoscopy or flexible sigmoidoscopy):  Excessive amounts of blood in the stool  Significant tenderness or worsening of abdominal pains  Swelling of the abdomen that is new, acute  Fever of 100F or higher  Following upper endoscopy (EGD)  Vomiting of blood or coffee ground material  New chest pain or pain under the shoulder blades  Painful or persistently difficult swallowing  New shortness of breath  Fever of 100F or higher  Black, tarry-looking stools  FOLLOW UP: If any biopsies were taken you will be contacted by phone or by letter within the next 1-3 weeks.  Call your gastroenterologist if you have not heard about the biopsies in 3 weeks.  Our staff will call the home number listed on your records the next business day following your procedure to check on you and address any questions or concerns that you may have at that time regarding  the information given to you following your procedure. This is a courtesy call and so if there is no answer at the home number and we have not heard from you through the emergency physician on call, we will assume that you have returned to your regular daily activities without incident.  SIGNATURES/CONFIDENTIALITY: You and/or your care partner have signed paperwork which will be entered into your electronic medical record.  These signatures attest to the fact that that the information above on your After Visit  Summary has been reviewed and is understood.  Full responsibility of the confidentiality of this discharge information lies with you and/or your care-partner.   GASTRITIS INFORMATION GIVEN TO YOU TODAY  BIOPSIES DONE AWAIT RESULTS

## 2013-03-17 ENCOUNTER — Telehealth: Payer: Self-pay | Admitting: *Deleted

## 2013-03-17 NOTE — Telephone Encounter (Signed)
  Follow up Call-  Call back number 03/16/2013 11/29/2012  Post procedure Call Back phone  # [phone]  Permission to leave phone message Yes Yes     Patient questions:  Message left to call us if necessary.

## 2013-03-18 ENCOUNTER — Telehealth: Payer: Self-pay | Admitting: Internal Medicine

## 2013-03-18 DIAGNOSIS — D2230 Melanocytic nevi of unspecified part of face: Secondary | ICD-10-CM

## 2013-03-18 NOTE — Telephone Encounter (Signed)
Please refer to dermatology

## 2013-03-18 NOTE — Telephone Encounter (Signed)
Pt states he has a mole on face that started out as a freckle a month ago but has since turned in to a mole and seems to be getting bigger each day.  Pt is asking if he needs to see Dr. Kirtland Bouchard about this first of if he can be referred to a dermatologist to evaluate.

## 2013-03-18 NOTE — Telephone Encounter (Signed)
Referral request sent and patient is aware 

## 2013-03-22 ENCOUNTER — Emergency Department (HOSPITAL_COMMUNITY)
Admission: EM | Admit: 2013-03-22 | Discharge: 2013-03-22 | Disposition: A | Discharge status: Home / Self Care | Payer: 59 | Attending: Emergency Medicine | Admitting: Emergency Medicine

## 2013-03-22 ENCOUNTER — Emergency Department (HOSPITAL_COMMUNITY): Payer: 59

## 2013-03-22 ENCOUNTER — Encounter (HOSPITAL_COMMUNITY): Payer: Self-pay | Admitting: Emergency Medicine

## 2013-03-22 ENCOUNTER — Telehealth: Payer: Self-pay | Admitting: Internal Medicine

## 2013-03-22 DIAGNOSIS — Z8639 Personal history of other endocrine, nutritional and metabolic disease: Secondary | ICD-10-CM | POA: Insufficient documentation

## 2013-03-22 DIAGNOSIS — F411 Generalized anxiety disorder: Secondary | ICD-10-CM | POA: Insufficient documentation

## 2013-03-22 DIAGNOSIS — Z8739 Personal history of other diseases of the musculoskeletal system and connective tissue: Secondary | ICD-10-CM | POA: Insufficient documentation

## 2013-03-22 DIAGNOSIS — R1031 Right lower quadrant pain: Secondary | ICD-10-CM | POA: Insufficient documentation

## 2013-03-22 DIAGNOSIS — Z8709 Personal history of other diseases of the respiratory system: Secondary | ICD-10-CM | POA: Insufficient documentation

## 2013-03-22 DIAGNOSIS — F3289 Other specified depressive episodes: Secondary | ICD-10-CM | POA: Insufficient documentation

## 2013-03-22 DIAGNOSIS — Z862 Personal history of diseases of the blood and blood-forming organs and certain disorders involving the immune mechanism: Secondary | ICD-10-CM | POA: Insufficient documentation

## 2013-03-22 DIAGNOSIS — F17200 Nicotine dependence, unspecified, uncomplicated: Secondary | ICD-10-CM | POA: Insufficient documentation

## 2013-03-22 DIAGNOSIS — Z79899 Other long term (current) drug therapy: Secondary | ICD-10-CM | POA: Insufficient documentation

## 2013-03-22 DIAGNOSIS — R110 Nausea: Secondary | ICD-10-CM | POA: Insufficient documentation

## 2013-03-22 DIAGNOSIS — Z8669 Personal history of other diseases of the nervous system and sense organs: Secondary | ICD-10-CM | POA: Insufficient documentation

## 2013-03-22 DIAGNOSIS — F329 Major depressive disorder, single episode, unspecified: Secondary | ICD-10-CM | POA: Insufficient documentation

## 2013-03-22 DIAGNOSIS — K219 Gastro-esophageal reflux disease without esophagitis: Secondary | ICD-10-CM | POA: Insufficient documentation

## 2013-03-22 DIAGNOSIS — R109 Unspecified abdominal pain: Secondary | ICD-10-CM

## 2013-03-22 LAB — URINALYSIS, ROUTINE W REFLEX MICROSCOPIC
Leukocytes, UA: NEGATIVE
Nitrite: NEGATIVE
Protein, ur: NEGATIVE mg/dL
Urobilinogen, UA: 1 mg/dL (ref 0.0–1.0)

## 2013-03-22 LAB — CBC WITH DIFFERENTIAL/PLATELET
Basophils Absolute: 0.1 10*3/uL (ref 0.0–0.1)
Eosinophils Absolute: 0.3 10*3/uL (ref 0.0–0.7)
Eosinophils Relative: 3 % (ref 0–5)
HCT: 47.4 % (ref 39.0–52.0)
Lymphocytes Relative: 17 % (ref 12–46)
Lymphs Abs: 1.7 10*3/uL (ref 0.7–4.0)
MCH: 31.5 pg (ref 26.0–34.0)
MCV: 86.8 fL (ref 78.0–100.0)
Monocytes Absolute: 0.5 10*3/uL (ref 0.1–1.0)
RDW: 13.5 % (ref 11.5–15.5)
WBC: 9.6 10*3/uL (ref 4.0–10.5)

## 2013-03-22 LAB — COMPREHENSIVE METABOLIC PANEL
CO2: 26 mEq/L (ref 19–32)
Calcium: 9.2 mg/dL (ref 8.4–10.5)
Creatinine, Ser: 0.89 mg/dL (ref 0.50–1.35)
GFR calc Af Amer: >90 mL/min (ref >90)
GFR calc non Af Amer: >90 mL/min (ref >90)
Glucose, Bld: 135 mg/dL — ABNORMAL HIGH (ref 70–99)

## 2013-03-22 MED ORDER — IOHEXOL 300 MG/ML  SOLN
100.0000 mL | Freq: Once | INTRAMUSCULAR | Status: AC | PRN
  Administered 2013-03-22: 100 mL via INTRAVENOUS

## 2013-03-22 MED ORDER — MORPHINE SULFATE 4 MG/ML IJ SOLN
4.0000 mg | Freq: Once | INTRAMUSCULAR | Status: AC
  Administered 2013-03-22: 4 mg via INTRAVENOUS
  Filled 2013-03-22: qty 1

## 2013-03-22 MED ORDER — HYDROCODONE-ACETAMINOPHEN 5-325 MG PO TABS: 2.0000 | ORAL_TABLET | ORAL | Status: DC | PRN

## 2013-03-22 MED ORDER — ONDANSETRON HCL 4 MG/2ML IJ SOLN
4.0000 mg | Freq: Once | INTRAMUSCULAR | Status: AC
  Administered 2013-03-22: 4 mg via INTRAVENOUS
  Filled 2013-03-22: qty 2

## 2013-03-22 MED ORDER — DICYCLOMINE HCL 20 MG PO TABS: 20.0000 mg | ORAL_TABLET | Freq: Four times a day (QID) | ORAL | Status: DC | PRN

## 2013-03-22 MED ORDER — IOHEXOL 300 MG/ML  SOLN
50.0000 mL | Freq: Once | INTRAMUSCULAR | Status: AC | PRN
  Administered 2013-03-22: 50 mL via ORAL

## 2013-03-22 NOTE — ED Notes (Signed)
Patient transported to CT 

## 2013-03-22 NOTE — Telephone Encounter (Signed)
Patient Information:  [name] Name: [place]  Phone: [phone]  Patient: Sean Wu, Sean Wu  Gender: Male  DOB: 1971-06-30  Age: 42 Years  PCP: Eleonore Chiquito [facility])  Office Follow Up:  Does the office need to follow up with this patient?: Yes  Instructions For The Office: PLEASE CALL BACK FOR APPT W/ DR Amador Cunas  RN Note:  Pt was recently evaluated by Laurette Schimke on 7-17, Pt states, pain is worse on 7-22, awaiting biopsy.  Pt refusing ED.  Pt requesting appt.  Symptoms  Reason For Call & Symptoms: Gernalized Abdominal Pain  Reviewed Health History In EMR: Yes  Reviewed Medications In EMR: Yes  Reviewed Allergies In EMR: Yes  Reviewed Surgeries / Procedures: Yes  Date of Onset of Symptoms: 03/22/2013  Treatments Tried: GasX  Treatments Tried Worked: No  Guideline(s) Used:  Abdominal Pain - Male  Disposition Per Guideline:   Go to ED Now  Reason For Disposition Reached:   Severe abdominal pain (e.g., excruciating)  Advice Given:  N/A  Patient Refused Recommendation:  Patient Refused Care Advice  Pt requesting appt.

## 2013-03-22 NOTE — ED Notes (Signed)
Pt states he began to have abd pain worse in RLQ since early this am. Awoke pt from sleep. Pt states pain is continuous. No n/v/d. LBM yesterday.

## 2013-03-22 NOTE — ED Provider Notes (Signed)
Signed out to me by Dr. Freida Busman to follow up on CT scan. Scan performed to rule out appendicitis. CT was normal.  Patient re-evaluated. Pain is improved, now having mild generalized discomfort. No peritonitis on exam. Patient reports that he had upper endoscopy performed last week. He says there was a biopsy taken. I did review the records and it was generalized inflammation without any evidence of Helicobacter pylori. She currently taking excellent, will continue. Can add Vicodin and Bentyl, followup with primary doctor as needed.  Gilda Crease, MD 03/22/13 [phone]

## 2013-03-28 ENCOUNTER — Other Ambulatory Visit: Payer: Self-pay | Admitting: Internal Medicine

## 2013-03-29 ENCOUNTER — Other Ambulatory Visit: Payer: Self-pay

## 2013-03-29 DIAGNOSIS — R109 Unspecified abdominal pain: Secondary | ICD-10-CM

## 2013-03-29 NOTE — Progress Notes (Signed)
You have been scheduled for a gastric emptying scan on 04/11/13 at 7 am [facility]. Please arrive at least 15 minutes prior to your appointment for registration. Please make certain not to have anything to eat or drink after midnight the night before your test. Hold all stomach medications (ex: Zofran, phenergan, Reglan) 48 hours prior to your test. If you need to reschedule your appointment, please contact radiology scheduling at [phone]. _____________________________________________________________________ A gastric-emptying study measures how long it takes for food to move through your stomach. There are several ways to measure stomach emptying. In the most common test, you eat food that contains a small amount of radioactive material. A scanner that detects the movement of the radioactive material is placed over your abdomen to monitor the rate at which food leaves your stomach. This test normally takes about 2 hours to complete. _____________________________________________________________________ Pt has been instructed and will call with any further questions

## 2013-04-08 NOTE — ED Provider Notes (Signed)
CSN: [id number]     Arrival date & time 03/22/13  1403 History     First MD Initiated Contact with Patient 03/22/13 1434     Chief Complaint  Patient presents with  . Abdominal Pain   (Consider location/radiation/quality/duration/timing/severity/associated sxs/prior Treatment) Patient is a 42 y.o. male presenting with abdominal pain. The history is provided by the patient.  Abdominal Pain Pain location:  RLQ Pain quality: pressure and stabbing   Pain radiates to:  Does not radiate Pain severity:  Moderate Onset quality:  Sudden Duration:  1 day Timing:  Constant Progression:  Worsening Chronicity:  New Relieved by:  Nothing Worsened by:  Position changes Ineffective treatments:  None tried Associated symptoms: nausea   Associated symptoms: no fever and no hematuria     Past Medical History  Diagnosis Date  . TESTICULAR HYPOFUNCTION 06/12/2009  . SMOKER 06/12/2009  . DEPRESSION 05/10/2009  . SLEEP APNEA, OBSTRUCTIVE 06/12/2009  . ALLERGIC RHINITIS 05/10/2009  . GERD 05/10/2009  . WEIGHT GAIN 05/10/2009  . LIBIDO, DECREASED 05/10/2009  . Osgood-Schlatter's disease   . Anxiety   . Chronic headaches    Past Surgical History  Procedure Laterality Date  . Lasik    . Ankle surgery Left   . Colonoscopy  2014    normal   Family History  Problem Relation Age of Onset  . Heart disease Maternal Grandfather   . Irritable bowel syndrome Father   . Alcoholism Father   . Heart attack Father   . Colon cancer Neg Hx   . Rectal cancer Neg Hx   . Stomach cancer Neg Hx    History  Substance Use Topics  . Smoking status: Current Every Day Smoker -- 0.50 packs/day for 15 years    Types: Cigarettes  . Smokeless tobacco: Former Neurosurgeon    Types: Chew    Quit date: 09/01/1998  . Alcohol Use: Yes     Comment: social    Review of Systems  Constitutional: Negative for fever.  Gastrointestinal: Positive for nausea and abdominal pain.  Genitourinary: Negative for hematuria.  All other  systems reviewed and are negative.    Allergies  Review of patient's allergies indicates no known allergies.  Home Medications   Current Outpatient Rx  Name  Route  Sig  Dispense  Refill  . ALPRAZolam (XANAX) 1 MG tablet   Oral   Take 1 mg by mouth 3 (three) times daily as needed for anxiety.          . Asenapine Maleate (SAPHRIS) 10 MG SUBL   Sublingual   Place 2 tablets under the tongue every evening.          Marland Kitchen dexlansoprazole (DEXILANT) 60 MG capsule   Oral   Take 1 capsule (60 mg total) by mouth daily.   30 capsule   2   . diphenhydrAMINE (BENADRYL) 25 MG tablet   Oral   Take 25 mg by mouth every 6 (six) hours as needed for itching or allergies.         Marland Kitchen lamoTRIgine (LAMICTAL) 150 MG tablet   Oral   Take 300 mg by mouth every morning.          . prochlorperazine (COMPAZINE) 10 MG tablet   Oral   Take 10 mg by mouth every 6 (six) hours as needed (nausea).         . sildenafil (VIAGRA) 100 MG tablet   Oral   Take 1 tablet (100 mg total) by mouth as  needed for erectile dysfunction.   12 tablet   2   . Testosterone 30 MG/ACT SOLN   Transdermal   Place 1 application onto the skin every morning.         . zolpidem (AMBIEN) 10 MG tablet   Oral   Take 10 mg by mouth at bedtime as needed for sleep.          Marland Kitchen dicyclomine (BENTYL) 20 MG tablet   Oral   Take 1 tablet (20 mg total) by mouth every 6 (six) hours as needed (abdominal pain).   20 tablet   0   . HYDROcodone-acetaminophen (NORCO/VICODIN) 5-325 MG per tablet   Oral   Take 2 tablets by mouth every 4 (four) hours as needed for pain.   10 tablet   0   . prochlorperazine (COMPAZINE) 10 MG tablet      TAKE 1 TABLET BY MOUTH EVERY 6 HOURS AS NEEDED   30 tablet   0    BP 121/78  Pulse 63  Temp(Src) 97.9 F (36.6 C) (Oral)  Resp 16  SpO2 98% Physical Exam  Nursing note and vitals reviewed. Constitutional: He is oriented to person, place, and time. He appears well-developed and  well-nourished.  Non-toxic appearance. No distress.  HENT:  Head: Normocephalic and atraumatic.  Eyes: Conjunctivae, EOM and lids are normal. Pupils are equal, round, and reactive to light.  Neck: Normal range of motion. Neck supple. No tracheal deviation present. No mass present.  Cardiovascular: Normal rate, regular rhythm and normal heart sounds.  Exam reveals no gallop.   No murmur heard. Pulmonary/Chest: Effort normal and breath sounds normal. No stridor. No respiratory distress. He has no decreased breath sounds. He has no wheezes. He has no rhonchi. He has no rales.  Abdominal: Soft. Normal appearance and bowel sounds are normal. He exhibits no distension. There is tenderness in the right lower quadrant. There is guarding. There is no rigidity, no rebound and no CVA tenderness.  Musculoskeletal: Normal range of motion. He exhibits no edema and no tenderness.  Neurological: He is alert and oriented to person, place, and time. He has normal strength. No cranial nerve deficit or sensory deficit. GCS eye subscore is 4. GCS verbal subscore is 5. GCS motor subscore is 6.  Skin: Skin is warm and dry. No abrasion and no rash noted.  Psychiatric: He has a normal mood and affect. His speech is normal and behavior is normal.    ED Course   Procedures (including critical care time)  Labs Reviewed  CBC WITH DIFFERENTIAL - Abnormal; Notable for the following:    Hemoglobin 17.2 (*)    MCHC 36.3 (*)    All other components within normal limits  COMPREHENSIVE METABOLIC PANEL - Abnormal; Notable for the following:    Glucose, Bld 135 (*)    All other components within normal limits  LIPASE, BLOOD  URINALYSIS, ROUTINE W REFLEX MICROSCOPIC   No results found. 1. Abdominal pain     MDM  Patient with new onset of right lower quadrant pain suspicious for appendicitis. Abdominal CT ordered and is pending at this time  Toy Baker, MD 04/08/13 [phone]

## 2013-04-11 ENCOUNTER — Encounter (HOSPITAL_COMMUNITY)
Admission: RE | Admit: 2013-04-11 | Discharge: 2013-04-11 | Disposition: A | Discharge status: Home / Self Care | Payer: 59 | Source: Ambulatory Visit | Attending: Gastroenterology | Admitting: Gastroenterology

## 2013-04-11 DIAGNOSIS — R141 Gas pain: Secondary | ICD-10-CM | POA: Insufficient documentation

## 2013-04-11 DIAGNOSIS — K3189 Other diseases of stomach and duodenum: Secondary | ICD-10-CM | POA: Insufficient documentation

## 2013-04-11 DIAGNOSIS — R142 Eructation: Secondary | ICD-10-CM | POA: Insufficient documentation

## 2013-04-11 DIAGNOSIS — R6881 Early satiety: Secondary | ICD-10-CM | POA: Insufficient documentation

## 2013-04-11 DIAGNOSIS — R110 Nausea: Secondary | ICD-10-CM | POA: Insufficient documentation

## 2013-04-11 DIAGNOSIS — R143 Flatulence: Secondary | ICD-10-CM | POA: Insufficient documentation

## 2013-04-11 DIAGNOSIS — R109 Unspecified abdominal pain: Secondary | ICD-10-CM

## 2013-04-11 MED ORDER — TECHNETIUM TC 99M SULFUR COLLOID
2.2000 | Freq: Once | INTRAVENOUS | Status: AC | PRN
  Administered 2013-04-11: 2.2 via INTRAVENOUS

## 2013-05-07 ENCOUNTER — Encounter (HOSPITAL_COMMUNITY): Payer: Self-pay | Admitting: Emergency Medicine

## 2013-05-07 ENCOUNTER — Emergency Department (HOSPITAL_COMMUNITY)
Admission: EM | Admit: 2013-05-07 | Discharge: 2013-05-07 | Disposition: A | Discharge status: Home / Self Care | Payer: 59 | Attending: Emergency Medicine | Admitting: Emergency Medicine

## 2013-05-07 DIAGNOSIS — Z8679 Personal history of other diseases of the circulatory system: Secondary | ICD-10-CM | POA: Insufficient documentation

## 2013-05-07 DIAGNOSIS — F411 Generalized anxiety disorder: Secondary | ICD-10-CM | POA: Insufficient documentation

## 2013-05-07 DIAGNOSIS — G4733 Obstructive sleep apnea (adult) (pediatric): Secondary | ICD-10-CM | POA: Insufficient documentation

## 2013-05-07 DIAGNOSIS — K219 Gastro-esophageal reflux disease without esophagitis: Secondary | ICD-10-CM | POA: Insufficient documentation

## 2013-05-07 DIAGNOSIS — F17200 Nicotine dependence, unspecified, uncomplicated: Secondary | ICD-10-CM | POA: Insufficient documentation

## 2013-05-07 DIAGNOSIS — F3289 Other specified depressive episodes: Secondary | ICD-10-CM | POA: Insufficient documentation

## 2013-05-07 DIAGNOSIS — Z87448 Personal history of other diseases of urinary system: Secondary | ICD-10-CM | POA: Insufficient documentation

## 2013-05-07 DIAGNOSIS — K529 Noninfective gastroenteritis and colitis, unspecified: Secondary | ICD-10-CM

## 2013-05-07 DIAGNOSIS — Z8739 Personal history of other diseases of the musculoskeletal system and connective tissue: Secondary | ICD-10-CM | POA: Insufficient documentation

## 2013-05-07 DIAGNOSIS — Z79899 Other long term (current) drug therapy: Secondary | ICD-10-CM | POA: Insufficient documentation

## 2013-05-07 DIAGNOSIS — K5289 Other specified noninfective gastroenteritis and colitis: Secondary | ICD-10-CM | POA: Insufficient documentation

## 2013-05-07 DIAGNOSIS — F329 Major depressive disorder, single episode, unspecified: Secondary | ICD-10-CM | POA: Insufficient documentation

## 2013-05-07 HISTORY — DX: Gastroparesis: K31.84

## 2013-05-07 MED ORDER — HYDROMORPHONE HCL PF 1 MG/ML IJ SOLN: 0.5000 mg | Freq: Once | INTRAMUSCULAR | Status: DC

## 2013-05-07 MED ORDER — FAMOTIDINE IN NACL 20-0.9 MG/50ML-% IV SOLN
20.0000 mg | Freq: Once | INTRAVENOUS | Status: AC
  Administered 2013-05-07: 20 mg via INTRAVENOUS
  Filled 2013-05-07: qty 50

## 2013-05-07 MED ORDER — HYDROCODONE-ACETAMINOPHEN 5-325 MG PO TABS: 1.0000 | ORAL_TABLET | ORAL | Status: DC | PRN

## 2013-05-07 MED ORDER — GI COCKTAIL ~~LOC~~
30.0000 mL | Freq: Once | ORAL | Status: AC
  Administered 2013-05-07: 30 mL via ORAL
  Filled 2013-05-07: qty 30

## 2013-05-07 MED ORDER — HYDROCODONE-ACETAMINOPHEN 5-325 MG PO TABS
2.0000 | ORAL_TABLET | Freq: Once | ORAL | Status: AC
  Administered 2013-05-07: 2 via ORAL
  Filled 2013-05-07: qty 2

## 2013-05-07 MED ORDER — ONDANSETRON HCL 8 MG PO TABS: 8.0000 mg | ORAL_TABLET | Freq: Three times a day (TID) | ORAL | Status: DC | PRN

## 2013-05-07 MED ORDER — SODIUM CHLORIDE 0.9 % IV BOLUS (SEPSIS)
1000.0000 mL | Freq: Once | INTRAVENOUS | Status: AC
  Administered 2013-05-07: 1000 mL via INTRAVENOUS

## 2013-05-07 MED ORDER — MORPHINE SULFATE 4 MG/ML IJ SOLN
4.0000 mg | Freq: Once | INTRAMUSCULAR | Status: AC
  Administered 2013-05-07: 4 mg via INTRAVENOUS
  Filled 2013-05-07: qty 1

## 2013-05-07 MED ORDER — ONDANSETRON HCL 4 MG/2ML IJ SOLN
4.0000 mg | Freq: Once | INTRAMUSCULAR | Status: AC
  Administered 2013-05-07: 4 mg via INTRAVENOUS
  Filled 2013-05-07: qty 2

## 2013-05-07 MED ORDER — PANTOPRAZOLE SODIUM 40 MG IV SOLR
40.0000 mg | Freq: Once | INTRAVENOUS | Status: AC
  Administered 2013-05-07: 40 mg via INTRAVENOUS
  Filled 2013-05-07: qty 40

## 2013-05-07 NOTE — ED Provider Notes (Signed)
CSN: [id number]     Arrival date & time 05/07/13  0202 History   First MD Initiated Contact with Patient 05/07/13 0216     Chief Complaint  Patient presents with  . Abdominal Pain   (Consider location/radiation/quality/duration/timing/severity/associated sxs/prior Treatment) HPI History provided by pt.   Pt c/o severe, diffuse upper abdominal pain that woke him from sleep early this morning.  Describes as sharp and non-radiating.  Associated w/ 4 episodes of vomiting and several episodes of diarrhea.  Denies fever, CP, hematemesis/hematochezia/melena, urinary sx, testicular pain.  No known sick contacts.  Ate restaurant [place] food yesterday at 2:30pm.  H/o GERD.  Did not drink any alcohol yesterday.  Has been taking up to 6 ibuprofen/d over the past week for general aches/pains.  No h/o abd surgeries. Per prior chart, pt seen for abd pain in 03/2013.  He reports that current sx are completely different.  Past Medical History  Diagnosis Date  . TESTICULAR HYPOFUNCTION 06/12/2009  . SMOKER 06/12/2009  . DEPRESSION 05/10/2009  . SLEEP APNEA, OBSTRUCTIVE 06/12/2009  . ALLERGIC RHINITIS 05/10/2009  . GERD 05/10/2009  . WEIGHT GAIN 05/10/2009  . LIBIDO, DECREASED 05/10/2009  . Osgood-Schlatter's disease   . Anxiety   . Chronic headaches   . Gastroparesis    Past Surgical History  Procedure Laterality Date  . Lasik    . Ankle surgery Left   . Colonoscopy  2014    normal   Family History  Problem Relation Age of Onset  . Heart disease Maternal Grandfather   . Irritable bowel syndrome Father   . Alcoholism Father   . Heart attack Father   . Colon cancer Neg Hx   . Rectal cancer Neg Hx   . Stomach cancer Neg Hx    History  Substance Use Topics  . Smoking status: Current Every Day Smoker -- 0.50 packs/day for 15 years    Types: Cigarettes  . Smokeless tobacco: Former Neurosurgeon    Types: Chew    Quit date: 09/01/1998  . Alcohol Use: Yes     Comment: social    Review of Systems  All other  systems reviewed and are negative.    Allergies  Review of patient's allergies indicates no known allergies.  Home Medications   Current Outpatient Rx  Name  Route  Sig  Dispense  Refill  . ALPRAZolam (XANAX) 1 MG tablet   Oral   Take 1 mg by mouth 3 (three) times daily as needed for anxiety.          . Asenapine Maleate (SAPHRIS) 10 MG SUBL   Sublingual   Place 2 tablets under the tongue every evening.          Marland Kitchen dexlansoprazole (DEXILANT) 60 MG capsule   Oral   Take 1 capsule (60 mg total) by mouth daily.   30 capsule   2   . dicyclomine (BENTYL) 20 MG tablet   Oral   Take 1 tablet (20 mg total) by mouth every 6 (six) hours as needed (abdominal pain).   20 tablet   0   . diphenhydrAMINE (BENADRYL) 25 MG tablet   Oral   Take 25 mg by mouth every 6 (six) hours as needed for itching or allergies.         Marland Kitchen HYDROcodone-acetaminophen (NORCO/VICODIN) 5-325 MG per tablet   Oral   Take 2 tablets by mouth every 4 (four) hours as needed for pain.   10 tablet   0   .  lamoTRIgine (LAMICTAL) 150 MG tablet   Oral   Take 300 mg by mouth every morning.          . prochlorperazine (COMPAZINE) 10 MG tablet   Oral   Take 10 mg by mouth every 6 (six) hours as needed (nausea).         . prochlorperazine (COMPAZINE) 10 MG tablet      TAKE 1 TABLET BY MOUTH EVERY 6 HOURS AS NEEDED   30 tablet   0   . sildenafil (VIAGRA) 100 MG tablet   Oral   Take 1 tablet (100 mg total) by mouth as needed for erectile dysfunction.   12 tablet   2   . Testosterone 30 MG/ACT SOLN   Transdermal   Place 1 application onto the skin every morning.         . zolpidem (AMBIEN) 10 MG tablet   Oral   Take 10 mg by mouth at bedtime as needed for sleep.           BP 108/73  Temp(Src) 97.7 F (36.5 C) (Oral)  Resp 20  Ht 5\' 9"  (1.753 m)  Wt 195 lb (88.451 kg)  BMI 28.78 kg/m2  SpO2 100% Physical Exam  Nursing note and vitals reviewed. Constitutional: He is oriented to  person, place, and time. He appears well-developed and well-nourished.  Uncomfortable appearing.  Pacing.   HENT:  Head: Normocephalic and atraumatic.  Mouth/Throat: Oropharynx is clear and moist.  Eyes:  Normal appearance  Neck: Normal range of motion.  Cardiovascular: Normal rate and regular rhythm.   Pulmonary/Chest: Effort normal and breath sounds normal. No respiratory distress.  Abdominal: Soft. Bowel sounds are normal. He exhibits no distension and no mass. There is no tenderness. There is no rebound and no guarding.  Genitourinary:  No CVA tenderness  Musculoskeletal: Normal range of motion.  Neurological: He is alert and oriented to person, place, and time.  Skin: Skin is warm and dry. No rash noted.  Psychiatric: He has a normal mood and affect. His behavior is normal.    ED Course  Procedures (including critical care time) Labs Review Labs Reviewed - No data to display Imaging Review No results found.  MDM   1. Gastroenteritis    42yo M presents w/ upper abdominal pain, N/V/D.  No pertinent PMH.  On exam, uncomfortable but non-toxic appearing, afebrile, well-hydrated, abd benign.  Suspect viral gastroenteritis or food poisoning from [place].  IVF, protonix, pepcid, morphine and zofran as well as GI cocktail ordered.  Will reassess shortly.  2:50 AM   Nausea resolved and pain improved.  If passes po challenge, will d/c home w/ vicodin and zofran.  I recommended that he drink plenty of fluids at home.  Return precautions discussed.  3:44 AM       Otilio Miu, PA-C 05/07/13 [phone]

## 2013-05-07 NOTE — ED Provider Notes (Signed)
Medical screening examination/treatment/procedure(s) were performed by non-physician practitioner and as supervising physician I was immediately available for consultation/collaboration.   Shanna Cisco, MD 05/07/13 [phone]

## 2013-05-07 NOTE — ED Notes (Signed)
Pt awoke 1 hour ago with severe abd pain, +n/v/d.

## 2013-05-25 ENCOUNTER — Ambulatory Visit (INDEPENDENT_AMBULATORY_CARE_PROVIDER_SITE_OTHER): Payer: 59 | Admitting: Gastroenterology

## 2013-05-25 ENCOUNTER — Encounter: Payer: Self-pay | Admitting: Gastroenterology

## 2013-05-25 VITALS — BP 106/70 | HR 76 | Ht 68.0 in | Wt 199.2 lb

## 2013-05-25 DIAGNOSIS — K3184 Gastroparesis: Secondary | ICD-10-CM

## 2013-05-25 MED ORDER — METOCLOPRAMIDE HCL 5 MG/5ML PO SOLN: 5.0000 mg | Freq: Three times a day (TID) | ORAL | Status: DC

## 2013-05-25 NOTE — Patient Instructions (Addendum)
You have been given a separate informational sheet regarding your tobacco use, the importance of quitting and local resources to help you quit. Will start reglan 5mg  pills, take one pill with each meal (three times per day). Please return to see Dr. Christella Hartigan in 4-5 weeks to check your response. You should still eat smaller more frequent meals.

## 2013-05-25 NOTE — Progress Notes (Signed)
Review of pertinent gastrointestinal problems: 1. routine risk for colon cancer, colonoscopy March 2014 found diverticulosis only. Recommended for recall colonoscopy at 10 year interval 2. Nausea, dysphagia, weight loss?.  Gastroparesis+ : EGD 03/2013 found H. Pylori Neg gastritis, + retained food in stomach.  GES 04/2013 confirmed gastroparesis   HPI: This is a very pleasant 42 year old man whom I last saw 2 months ago at the time of upper endoscopy.  Lost 10 pounds in past 6 motnths on our scale.  Feels like nausea is getting worse, eating a meal "sits forever" he is very bloated.  He is eating 3-4 small meals per day. Belching a lot, gassiness.  Takes narcotic pain meds very rarely.  Takes less than once weekly.  He has cut back on NSAIDs.  Takes them very rarely now     Past Medical History  Diagnosis Date  . TESTICULAR HYPOFUNCTION 06/12/2009  . SMOKER 06/12/2009  . DEPRESSION 05/10/2009  . SLEEP APNEA, OBSTRUCTIVE 06/12/2009  . ALLERGIC RHINITIS 05/10/2009  . GERD 05/10/2009  . WEIGHT GAIN 05/10/2009  . LIBIDO, DECREASED 05/10/2009  . Osgood-Schlatter's disease   . Anxiety   . Chronic headaches   . Gastroparesis     Past Surgical History  Procedure Laterality Date  . Lasik    . Ankle surgery Left   . Colonoscopy  2014    normal    Current Outpatient Prescriptions  Medication Sig Dispense Refill  . Alpha-D-Galactosidase (BEANO PO) Take 1 tablet by mouth 2 (two) times daily as needed (gas).       . ALPRAZolam (XANAX) 1 MG tablet Take 1 mg by mouth 3 (three) times daily as needed for anxiety.       . Asenapine Maleate (SAPHRIS) 10 MG SUBL Place 2 tablets under the tongue every evening.       . diphenhydrAMINE (BENADRYL) 25 MG tablet Take 25 mg by mouth at bedtime as needed for itching or sleep.       Marland Kitchen HYDROcodone-acetaminophen (NORCO/VICODIN) 5-325 MG per tablet Take 1 tablet by mouth every 4 (four) hours as needed for pain.  20 tablet  0  . lamoTRIgine (LAMICTAL) 150 MG  tablet Take 300 mg by mouth every morning.       . prochlorperazine (COMPAZINE) 10 MG tablet Take 10 mg by mouth every 6 (six) hours as needed (nausea).      . sildenafil (VIAGRA) 100 MG tablet Take 1 tablet (100 mg total) by mouth as needed for erectile dysfunction.  12 tablet  2  . Testosterone (AXIRON) 30 MG/ACT SOLN Place 1 application onto the skin every morning.      . zolpidem (AMBIEN) 10 MG tablet Take 10 mg by mouth at bedtime as needed for sleep.       . [DISCONTINUED] omeprazole (PRILOSEC OTC) 20 MG tablet 1 tablet twice daily. Do not eat for one hour after ingestion of the medication  60 tablet  11   No current facility-administered medications for this visit.    Allergies as of 05/25/2013  . (No Known Allergies)    Family History  Problem Relation Age of Onset  . Heart disease Maternal Grandfather   . Irritable bowel syndrome Father   . Alcoholism Father   . Heart attack Father   . Colon cancer Neg Hx   . Rectal cancer Neg Hx   . Stomach cancer Neg Hx     History   Social History  . Marital Status: Married    Spouse Name:  N/A    Number of Children: 0  . Years of Education: N/A   Occupational History  . sales    Social History Main Topics  . Smoking status: Current Every Day Smoker -- 0.50 packs/day for 15 years    Types: Cigarettes  . Smokeless tobacco: Former Neurosurgeon    Types: Chew    Quit date: 09/01/1998  . Alcohol Use: Yes     Comment: social  . Drug Use: No  . Sexual Activity: Not on file   Other Topics Concern  . Not on file   Social History Narrative  . No narrative on file      Physical Exam: BP 106/70  Pulse 76  Ht 5\' 8"  (1.727 m)  Wt 199 lb 4 oz (90.379 kg)  BMI 30.3 kg/m2 Constitutional: generally well-appearing Psychiatric: alert and oriented x3 Abdomen: soft, nontender, nondistended, no obvious ascites, no peritoneal signs, normal bowel sounds     Assessment and plan: 42 y.o. male with bloating, nausea from  gastroparesis  He has tried eating smaller more frequent meals but is still bothered by gastroparesis-like symptoms. We discussed a trial of Reglan. We specifically discussed the nonreversible side effect of tardive dyskinesia. I explained that it is very unlikely that he would have that side effect, the people who is his occurred 2 generally tender the older and on much higher doses of the doses I will be putting him on. He understands and wishes to proceed. He'll start 5 mg pills of Reglan take them 3 times daily with meals. He'll return to see me in 4-5 weeks and sooner if needed.

## 2013-06-13 ENCOUNTER — Other Ambulatory Visit: Payer: Self-pay | Admitting: Internal Medicine

## 2013-06-14 ENCOUNTER — Other Ambulatory Visit: Payer: Self-pay | Admitting: Internal Medicine

## 2013-06-22 ENCOUNTER — Ambulatory Visit: Payer: 59 | Admitting: Family Medicine

## 2013-06-22 ENCOUNTER — Encounter (HOSPITAL_COMMUNITY): Payer: Self-pay | Admitting: Emergency Medicine

## 2013-06-22 ENCOUNTER — Emergency Department (HOSPITAL_COMMUNITY)
Admission: EM | Admit: 2013-06-22 | Discharge: 2013-06-22 | Disposition: A | Discharge status: Home / Self Care | Payer: 59 | Attending: Emergency Medicine | Admitting: Emergency Medicine

## 2013-06-22 ENCOUNTER — Emergency Department (HOSPITAL_COMMUNITY): Payer: 59

## 2013-06-22 DIAGNOSIS — E291 Testicular hypofunction: Secondary | ICD-10-CM | POA: Insufficient documentation

## 2013-06-22 DIAGNOSIS — R339 Retention of urine, unspecified: Secondary | ICD-10-CM

## 2013-06-22 DIAGNOSIS — F3289 Other specified depressive episodes: Secondary | ICD-10-CM | POA: Insufficient documentation

## 2013-06-22 DIAGNOSIS — F17200 Nicotine dependence, unspecified, uncomplicated: Secondary | ICD-10-CM | POA: Insufficient documentation

## 2013-06-22 DIAGNOSIS — Z9889 Other specified postprocedural states: Secondary | ICD-10-CM | POA: Insufficient documentation

## 2013-06-22 DIAGNOSIS — G4733 Obstructive sleep apnea (adult) (pediatric): Secondary | ICD-10-CM | POA: Insufficient documentation

## 2013-06-22 DIAGNOSIS — Z79899 Other long term (current) drug therapy: Secondary | ICD-10-CM | POA: Insufficient documentation

## 2013-06-22 DIAGNOSIS — Z8739 Personal history of other diseases of the musculoskeletal system and connective tissue: Secondary | ICD-10-CM | POA: Insufficient documentation

## 2013-06-22 DIAGNOSIS — F329 Major depressive disorder, single episode, unspecified: Secondary | ICD-10-CM | POA: Insufficient documentation

## 2013-06-22 DIAGNOSIS — Z8719 Personal history of other diseases of the digestive system: Secondary | ICD-10-CM | POA: Insufficient documentation

## 2013-06-22 DIAGNOSIS — M5410 Radiculopathy, site unspecified: Secondary | ICD-10-CM

## 2013-06-22 DIAGNOSIS — M79609 Pain in unspecified limb: Secondary | ICD-10-CM | POA: Insufficient documentation

## 2013-06-22 DIAGNOSIS — IMO0002 Reserved for concepts with insufficient information to code with codable children: Secondary | ICD-10-CM | POA: Insufficient documentation

## 2013-06-22 DIAGNOSIS — R209 Unspecified disturbances of skin sensation: Secondary | ICD-10-CM | POA: Insufficient documentation

## 2013-06-22 DIAGNOSIS — R319 Hematuria, unspecified: Secondary | ICD-10-CM

## 2013-06-22 DIAGNOSIS — R109 Unspecified abdominal pain: Secondary | ICD-10-CM | POA: Insufficient documentation

## 2013-06-22 LAB — URINE MICROSCOPIC-ADD ON

## 2013-06-22 LAB — URINALYSIS, ROUTINE W REFLEX MICROSCOPIC
Glucose, UA: NEGATIVE mg/dL
Ketones, ur: 40 mg/dL — AB
Nitrite: NEGATIVE
Specific Gravity, Urine: 1.017 (ref 1.005–1.030)
pH: 8.5 — ABNORMAL HIGH (ref 5.0–8.0)

## 2013-06-22 MED ORDER — HYDROMORPHONE HCL PF 1 MG/ML IJ SOLN
1.0000 mg | Freq: Once | INTRAMUSCULAR | Status: AC
  Administered 2013-06-22: 1 mg via INTRAVENOUS
  Filled 2013-06-22: qty 1

## 2013-06-22 MED ORDER — HYDROMORPHONE HCL PF 1 MG/ML IJ SOLN
1.0000 mg | Freq: Once | INTRAMUSCULAR | Status: AC
  Administered 2013-06-22: 1 mg via INTRAMUSCULAR
  Filled 2013-06-22: qty 1

## 2013-06-22 MED ORDER — HYDROCODONE-ACETAMINOPHEN 5-325 MG PO TABS: 1.0000 | ORAL_TABLET | ORAL | Status: DC | PRN

## 2013-06-22 MED ORDER — HYDROMORPHONE HCL PF 1 MG/ML IJ SOLN: 1.0000 mg | Freq: Once | INTRAMUSCULAR | Status: DC

## 2013-06-22 MED ORDER — DIAZEPAM 5 MG PO TABS
5.0000 mg | ORAL_TABLET | Freq: Once | ORAL | Status: AC
  Administered 2013-06-22: 5 mg via ORAL
  Filled 2013-06-22: qty 1

## 2013-06-22 MED ORDER — ONDANSETRON HCL 4 MG/2ML IJ SOLN
4.0000 mg | Freq: Once | INTRAMUSCULAR | Status: AC
  Administered 2013-06-22: 4 mg via INTRAVENOUS
  Filled 2013-06-22: qty 2

## 2013-06-22 MED ORDER — DIAZEPAM 5 MG PO TABS: 5.0000 mg | ORAL_TABLET | Freq: Two times a day (BID) | ORAL | Status: DC

## 2013-06-22 NOTE — ED Notes (Signed)
Pt c/o lt low back pain with pain radiating into lt leg.  States that his lt leg is going numb.  States that the numbness comes in waves.  Started this morning.  Never happened before.  No back problems.  Pt is pale/diaphoretic.

## 2013-06-22 NOTE — ED Provider Notes (Signed)
CSN: [id number]     Arrival date & time 06/22/13  1134 History   First MD Initiated Contact with Patient 06/22/13 1223     Chief Complaint  Patient presents with  . Back Pain  . Leg Pain  . Numbness   (Consider location/radiation/quality/duration/timing/severity/associated sxs/prior Treatment) HPI  42 year old male who just recently started a new job working for [employer] presents complaining of acute onset of low back pain that radiates down his left leg. Patient states he was lifting heavy boxes last night and at the end of his shift, he return home and within an hour later he developed tightness and sharp shooting pain from his low back radiates down to his left leg. Pain is been persistent, not improve with Advil. Now he report having tingling sensation to his lower leg. Symptoms worsen with movement and not improved with rest. No associated fever, chills, lightheadedness, dizziness, chest pain, shortness of breath, abdominal pain, dysuria, hematuria, or rash. No history of IV drug use. No recent trauma.  Denies bowel incontinence or saddle paresthesia.  Sts she has hx of difficulty urinating which has been ongoing and has been evaluate for BPH but sts he was not diagnosed with it.    Past Medical History  Diagnosis Date  . TESTICULAR HYPOFUNCTION 06/12/2009  . SMOKER 06/12/2009  . DEPRESSION 05/10/2009  . SLEEP APNEA, OBSTRUCTIVE 06/12/2009  . ALLERGIC RHINITIS 05/10/2009  . GERD 05/10/2009  . WEIGHT GAIN 05/10/2009  . LIBIDO, DECREASED 05/10/2009  . Osgood-Schlatter's disease   . Anxiety   . Chronic headaches   . Gastroparesis    Past Surgical History  Procedure Laterality Date  . Lasik    . Ankle surgery Left   . Colonoscopy  2014    normal   Family History  Problem Relation Age of Onset  . Heart disease Maternal Grandfather   . Irritable bowel syndrome Father   . Alcoholism Father   . Heart attack Father   . Colon cancer Neg Hx   . Rectal cancer Neg Hx   . Stomach cancer Neg Hx     History  Substance Use Topics  . Smoking status: Current Every Day Smoker -- 0.50 packs/day for 15 years    Types: Cigarettes  . Smokeless tobacco: Former Neurosurgeon    Types: Chew    Quit date: 09/01/1998  . Alcohol Use: Yes     Comment: social    Review of Systems  Constitutional: Negative for fever.  Genitourinary: Positive for flank pain. Negative for hematuria and penile pain.  Musculoskeletal: Positive for back pain.  Skin: Negative for rash and wound.    Allergies  Review of patient's allergies indicates no known allergies.  Home Medications   Current Outpatient Rx  Name  Route  Sig  Dispense  Refill  . ALPRAZolam (XANAX) 1 MG tablet   Oral   Take 1 mg by mouth 3 (three) times daily as needed for anxiety.          . Asenapine Maleate (SAPHRIS) 10 MG SUBL   Sublingual   Place 2 tablets under the tongue every evening.          . diphenhydrAMINE (BENADRYL) 25 MG tablet   Oral   Take 25 mg by mouth every morning.          . lamoTRIgine (LAMICTAL) 150 MG tablet   Oral   Take 300 mg by mouth every morning.          . Testosterone (AXIRON) 30  MG/ACT SOLN   Transdermal   Place 1 application onto the skin every morning.         . zolpidem (AMBIEN) 10 MG tablet   Oral   Take 10 mg by mouth at bedtime as needed for sleep.          . sildenafil (VIAGRA) 100 MG tablet   Oral   Take 1 tablet (100 mg total) by mouth as needed for erectile dysfunction.   12 tablet   2    There were no vitals taken for this visit. Physical Exam  Nursing note and vitals reviewed. Constitutional: He appears well-developed and well-nourished. He appears distressed (Appears very uncomfortable, laying flat in bed.).  HENT:  Head: Atraumatic.  Eyes: Conjunctivae are normal.  Neck: Neck supple.  Cardiovascular: Normal rate, regular rhythm and intact distal pulses.   Pulmonary/Chest: Effort normal and breath sounds normal.  Abdominal: Soft. There is no tenderness. There is  no rebound and no guarding.  Musculoskeletal: He exhibits tenderness (Tenderness to left paralumbar region with positive straight leg raise. Normal left hip range of motion, with tenderness. No deformity noted.).  No significant midline spine tenderness, crepitus or step off noted.    Neurological:  Left Patellar deep tendon reflex 2+. No foot drop. Normal Babinski.  Skin: No rash noted.    ED Course  Procedures (including critical care time)  12:28 PM Patient here with acute onset of left low back pain with radicular pain. Pain likely related to musculoskeletal from muscle strain. We'll give pain medication and muscle relaxant. Will also obtain UA to rule out kidney stones.  2:21 PM Pt continues to endorse pain.  Also report difficulty urinating.  In and Out cath performed with 300cc of urine.  Will send for UA, will also perform bladder scan.  Pain medication given.  Pt however has normal rectal tone and intact Patella DTR bilat, no foot drop.    3:01 PM UA shows evidence of bloody urine suspicious for kidney stone. No signs of UTI.  Pt continues to endorse moderate pain not improved after receiving 2mg  of Dilaudid.  Will continue with pain management, will obtain CT scan for further evaluation.    4:08 PM CT scan shows no evidence to suggest kidney stone or signs of acute abnormality.  Will obtain bladder scan, will also have oncoming PA to continue to monitor pt and discharge once sxs improves and able to ambulate.  His sxs may be related to radicular pain 2/2 recent heavy lifting.    Labs Review Labs Reviewed  URINALYSIS, ROUTINE W REFLEX MICROSCOPIC - Abnormal; Notable for the following:    pH 8.5 (*)    Hgb urine dipstick MODERATE (*)    Bilirubin Urine MODERATE (*)    Ketones, ur 40 (*)    Protein, ur 30 (*)    All other components within normal limits  URINE MICROSCOPIC-ADD ON - Abnormal; Notable for the following:    Bacteria, UA FEW (*)    All other components within  normal limits  POCT I-STAT CREATININE   Imaging Review Ct Abdomen Pelvis Wo Contrast  06/22/2013   CLINICAL DATA:  Left flank pain.  EXAM: CT ABDOMEN AND PELVIS WITHOUT CONTRAST  TECHNIQUE: Multidetector CT imaging of the abdomen and pelvis was performed following the standard protocol without intravenous contrast.  COMPARISON:  CT scan of March 22, 2013.  FINDINGS: Visualized lung bases appear normal. The liver, spleen and pancreas appear normal. No gallstones are noted. The appendix  appears normal. Adrenal glands appear normal. No hydronephrosis or renal obstruction is noted. No renal or ureteral calculi are noted. No evidence of bowel obstruction is noted. No abnormal fluid collection is noted. Urinary bladder appears normal.  IMPRESSION: No abnormality seen in the abdomen or pelvis.   Electronically Signed   By: Roque Lias M.D.   On: 06/22/2013 16:01   Dg Lumbar Spine Complete  06/22/2013   CLINICAL DATA:  Low back pain  EXAM: LUMBAR SPINE - COMPLETE 4+ VIEW  COMPARISON:  None.  FINDINGS: Vertebral body height is well maintained. Mild osteophytic changes are seen. No spondylolysis or spondylolisthesis is seen.  IMPRESSION: Mild degenerative change without acute abnormality.   Electronically Signed   By: Alcide Clever M.D.   On: 06/22/2013 14:32    EKG Interpretation   None       MDM   1. Hematuria   2. Radicular low back pain    BP 134/79  Pulse 87  Temp(Src) 98.5 F (36.9 C) (Oral)  Resp 14  SpO2 96%     Fayrene Helper, PA-C 06/22/13 1612

## 2013-06-22 NOTE — ED Notes (Signed)
Greater than 495 on bladder scan

## 2013-06-22 NOTE — ED Provider Notes (Signed)
Medical screening examination/treatment/procedure(s) were conducted as a shared visit with non-physician practitioner(s) and myself.  I personally evaluated the patient during the encounter.  EKG Interpretation   None       Pt comes in with cc of left sided flank pain and urinary retention (on goinf issue). He denies any midline back pain, and associated numbness, weakness, urinary incontinence, bowel incontinence, weakness, saddle anesthesia. Neuro exam is benign, including reflexes. No ivda. I dont think the urinary retention (close to 500 cc) is related to spinal cord issues). Will place a foley, and have urology f/u, will need some urodynamic studies.    Derwood Kaplan, MD 06/22/13 [phone]

## 2013-06-22 NOTE — ED Provider Notes (Signed)
Care assumed from [place], [place] at shift change. CT abdomen/pelvis without contrast pending to rule out kidney stone. If no kidney stone seen, diagnosis most likely lumbar strain. Patient had sudden onset left-sided back pain radiating down left leg. No neurologic deficits. Urinalysis showed moderate hemoglobin which was concerning for a stone, has had difficulty urinating. Pending CT results, plan is to discharge home with pain control. 4:10 PM CT negative for stone. Will continue to control pain, make sure patient can ambulate and discharge home. Patient aware of CT results, states his pain has somewhat improved. 5:03 PM Bladder scan was performed per order from prior provider, >495 cc noted in bladder. Patient states he has been having difficulty urinating for the past month. Has the urge, just cannot get it out. UA obtained in ED via in and out cath. Rectal exam performed, normal rectal tone, sensation intact. 5:40 PM Patient also evaluated by Dr. Rhunette Croft. He will be discharged home with foley catheter, urology follow up. No midline back tenderness, saddle anesthesia, neuro deficits. Return precautions given. Patient states understanding of treatment care plan and is agreeable.  Ct Abdomen Pelvis Wo Contrast  06/22/2013   CLINICAL DATA:  Left flank pain.  EXAM: CT ABDOMEN AND PELVIS WITHOUT CONTRAST  TECHNIQUE: Multidetector CT imaging of the abdomen and pelvis was performed following the standard protocol without intravenous contrast.  COMPARISON:  CT scan of March 22, 2013.  FINDINGS: Visualized lung bases appear normal. The liver, spleen and pancreas appear normal. No gallstones are noted. The appendix appears normal. Adrenal glands appear normal. No hydronephrosis or renal obstruction is noted. No renal or ureteral calculi are noted. No evidence of bowel obstruction is noted. No abnormal fluid collection is noted. Urinary bladder appears normal.  IMPRESSION: No abnormality seen in the abdomen or  pelvis.   Electronically Signed   By: Roque Lias M.D.   On: 06/22/2013 16:01   Dg Lumbar Spine Complete  06/22/2013   CLINICAL DATA:  Low back pain  EXAM: LUMBAR SPINE - COMPLETE 4+ VIEW  COMPARISON:  None.  FINDINGS: Vertebral body height is well maintained. Mild osteophytic changes are seen. No spondylolysis or spondylolisthesis is seen.  IMPRESSION: Mild degenerative change without acute abnormality.   Electronically Signed   By: Alcide Clever M.D.   On: 06/22/2013 14:32    Sean Mace, PA-C 06/22/13 1741

## 2013-06-23 ENCOUNTER — Emergency Department (HOSPITAL_COMMUNITY)
Admission: EM | Admit: 2013-06-23 | Discharge: 2013-06-23 | Disposition: A | Discharge status: Home / Self Care | Payer: 59 | Attending: Emergency Medicine | Admitting: Emergency Medicine

## 2013-06-23 ENCOUNTER — Emergency Department (HOSPITAL_COMMUNITY): Payer: 59

## 2013-06-23 ENCOUNTER — Encounter (HOSPITAL_COMMUNITY): Payer: Self-pay | Admitting: Emergency Medicine

## 2013-06-23 DIAGNOSIS — F329 Major depressive disorder, single episode, unspecified: Secondary | ICD-10-CM | POA: Insufficient documentation

## 2013-06-23 DIAGNOSIS — F411 Generalized anxiety disorder: Secondary | ICD-10-CM | POA: Insufficient documentation

## 2013-06-23 DIAGNOSIS — Z8669 Personal history of other diseases of the nervous system and sense organs: Secondary | ICD-10-CM | POA: Insufficient documentation

## 2013-06-23 DIAGNOSIS — R339 Retention of urine, unspecified: Secondary | ICD-10-CM | POA: Insufficient documentation

## 2013-06-23 DIAGNOSIS — Z8639 Personal history of other endocrine, nutritional and metabolic disease: Secondary | ICD-10-CM | POA: Insufficient documentation

## 2013-06-23 DIAGNOSIS — F3289 Other specified depressive episodes: Secondary | ICD-10-CM | POA: Insufficient documentation

## 2013-06-23 DIAGNOSIS — F17200 Nicotine dependence, unspecified, uncomplicated: Secondary | ICD-10-CM | POA: Insufficient documentation

## 2013-06-23 DIAGNOSIS — Z8719 Personal history of other diseases of the digestive system: Secondary | ICD-10-CM | POA: Insufficient documentation

## 2013-06-23 DIAGNOSIS — M549 Dorsalgia, unspecified: Secondary | ICD-10-CM | POA: Insufficient documentation

## 2013-06-23 DIAGNOSIS — G8929 Other chronic pain: Secondary | ICD-10-CM | POA: Insufficient documentation

## 2013-06-23 DIAGNOSIS — Z862 Personal history of diseases of the blood and blood-forming organs and certain disorders involving the immune mechanism: Secondary | ICD-10-CM | POA: Insufficient documentation

## 2013-06-23 DIAGNOSIS — R109 Unspecified abdominal pain: Secondary | ICD-10-CM | POA: Insufficient documentation

## 2013-06-23 DIAGNOSIS — Z8739 Personal history of other diseases of the musculoskeletal system and connective tissue: Secondary | ICD-10-CM | POA: Insufficient documentation

## 2013-06-23 DIAGNOSIS — Z79899 Other long term (current) drug therapy: Secondary | ICD-10-CM | POA: Insufficient documentation

## 2013-06-23 DIAGNOSIS — Z8709 Personal history of other diseases of the respiratory system: Secondary | ICD-10-CM | POA: Insufficient documentation

## 2013-06-23 MED ORDER — DIAZEPAM 5 MG PO TABS: 5.0000 mg | ORAL_TABLET | Freq: Three times a day (TID) | ORAL | Status: DC | PRN

## 2013-06-23 MED ORDER — METHOCARBAMOL 100 MG/ML IJ SOLN
1000.0000 mg | Freq: Once | INTRAVENOUS | Status: AC
  Administered 2013-06-23: 1000 mg via INTRAVENOUS
  Filled 2013-06-23: qty 10

## 2013-06-23 MED ORDER — KETOROLAC TROMETHAMINE 30 MG/ML IJ SOLN
30.0000 mg | Freq: Once | INTRAMUSCULAR | Status: AC
  Administered 2013-06-23: 30 mg via INTRAVENOUS
  Filled 2013-06-23: qty 1

## 2013-06-23 MED ORDER — HYDROMORPHONE HCL PF 1 MG/ML IJ SOLN
1.0000 mg | INTRAMUSCULAR | Status: DC | PRN
  Administered 2013-06-23: 1 mg via INTRAVENOUS
  Filled 2013-06-23: qty 1

## 2013-06-23 MED ORDER — NAPROXEN 500 MG PO TABS: 500.0000 mg | ORAL_TABLET | Freq: Two times a day (BID) | ORAL | Status: DC

## 2013-06-23 MED ORDER — DEXAMETHASONE SODIUM PHOSPHATE 10 MG/ML IJ SOLN
10.0000 mg | Freq: Once | INTRAMUSCULAR | Status: AC
  Administered 2013-06-23: 10 mg via INTRAVENOUS
  Filled 2013-06-23: qty 1

## 2013-06-23 MED ORDER — ONDANSETRON HCL 4 MG/2ML IJ SOLN
4.0000 mg | Freq: Once | INTRAMUSCULAR | Status: AC
  Administered 2013-06-23: 4 mg via INTRAVENOUS
  Filled 2013-06-23: qty 2

## 2013-06-23 MED ORDER — OXYCODONE-ACETAMINOPHEN 10-325 MG PO TABS: 1.0000 | ORAL_TABLET | ORAL | Status: DC | PRN

## 2013-06-23 MED ORDER — METHOCARBAMOL 500 MG PO TABS: 500.0000 mg | ORAL_TABLET | Freq: Two times a day (BID) | ORAL | Status: DC

## 2013-06-23 NOTE — ED Notes (Signed)
Pt from home c/o low back pain and left flank pain and is unrelieved with his pain medication prescribed.. Pt was seen here yesterday for same along with urinary retention in which a foley catheter was placed. He states he has not emptied his foley  since it was placed  Yesterday, currently present  in bag. He also reports his pain is worse today than yesterday.

## 2013-06-23 NOTE — ED Provider Notes (Signed)
Medical screening examination/treatment/procedure(s) were performed by non-physician practitioner and as supervising physician I was immediately available for consultation/collaboration.  EKG Interpretation   None        Enid Skeens, MD 06/23/13 1505

## 2013-06-23 NOTE — ED Notes (Signed)
Patient transported to MRI 

## 2013-06-23 NOTE — ED Notes (Signed)
MD at bedside. 

## 2013-06-23 NOTE — ED Provider Notes (Signed)
CSN: [id number]     Arrival date & time 06/23/13  0857 History   First MD Initiated Contact with Patient 06/23/13 [phone]     Chief Complaint  Patient presents with  . Flank Pain  . 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 He states occasionally he will have a sore back but usually not suffer with back pain on any sort of regular basis. Never had an ER or evaluated by a physician or had imaging for his back. Also describes several months of urinary hesitancy which sounds like a large prostate for over 6 months. He states his doctor examined him and told that his prostate was not enlarged. He is not on any treatment for his prostate. Yesterday morning he awakened with sudden left-sided back pain no 800. She was here in the emergency room for a lot of the day. He had a normal CT scan. He had urinary retention and require Foley catheter. He called urology for followup this morning and they're not able see him for several weeks. He states it is painful to the point he could not get up and walk at home and he presents here.  He has never had frank urinary retention before. Denies fevers shakes chills. No IV drug use. States pain is left-sided radiates to his left lateral hip onto his anterior groin. Does not bruise medial groin. No pain further down the leg to the knee lower leg foot or ankle. He felt some numbness in the leg yesterday. Has not filled today. His gait is mostly limited by pain. He states his left leg with "shaking" yesterday but it does support his weight.    Past Medical History  Diagnosis Date  . TESTICULAR HYPOFUNCTION 06/12/2009  . SMOKER 06/12/2009  . DEPRESSION 05/10/2009  . SLEEP APNEA, OBSTRUCTIVE 06/12/2009  . ALLERGIC RHINITIS 05/10/2009  . GERD 05/10/2009  . WEIGHT GAIN 05/10/2009  . LIBIDO, DECREASED 05/10/2009  . Osgood-Schlatter's disease   . Anxiety   . Chronic headaches   . Gastroparesis    Past Surgical History  Procedure Laterality  Date  . Lasik    . Ankle surgery Left   . Colonoscopy  2014    normal   Family History  Problem Relation Age of Onset  . Heart disease Maternal Grandfather   . Irritable bowel syndrome Father   . Alcoholism Father   . Heart attack Father   . Colon cancer Neg Hx   . Rectal cancer Neg Hx   . Stomach cancer Neg Hx    History  Substance Use Topics  . Smoking status: Current Every Day Smoker -- 0.50 packs/day for 15 years    Types: Cigarettes  . Smokeless tobacco: Former Neurosurgeon    Types: Chew    Quit date: 09/01/1998  . Alcohol Use: Yes     Comment: social    Review of Systems  Constitutional: Negative for fever, chills, diaphoresis, appetite change and fatigue.  HENT: Negative for mouth sores, sore throat and trouble swallowing.   Eyes: Negative for visual disturbance.  Respiratory: Negative for cough, chest tightness, shortness of breath and wheezing.   Cardiovascular: Negative for chest pain.  Gastrointestinal: Negative for nausea, vomiting, abdominal pain, diarrhea and abdominal distention.  Endocrine: Negative for polydipsia, polyphagia and polyuria.  Genitourinary: Positive for decreased urine volume. Negative for dysuria, frequency and hematuria.  Musculoskeletal: Positive for arthralgias, back pain, gait problem and myalgias. Negative for  neck pain.  Skin: Negative for color change, pallor and rash.  Neurological: Negative for dizziness, syncope, light-headedness and headaches.  Hematological: Does not bruise/bleed easily.  Psychiatric/Behavioral: Negative for behavioral problems and confusion.    Allergies  Review of patient's allergies indicates no known allergies.  Home Medications   Current Outpatient Rx  Name  Route  Sig  Dispense  Refill  . ALPRAZolam (XANAX) 1 MG tablet   Oral   Take 1 mg by mouth 3 (three) times daily as needed for anxiety.          . Asenapine Maleate (SAPHRIS) 10 MG SUBL   Sublingual   Place 2 tablets under the tongue every  evening.          . diazepam (VALIUM) 5 MG tablet   Oral   Take 1 tablet (5 mg total) by mouth 2 (two) times daily.   10 tablet   0   . diphenhydrAMINE (BENADRYL) 25 MG tablet   Oral   Take 25 mg by mouth every 6 (six) hours as needed for allergies.          Marland Kitchen HYDROcodone-acetaminophen (NORCO/VICODIN) 5-325 MG per tablet   Oral   Take 1-2 tablets by mouth every 4 (four) hours as needed for pain.         Marland Kitchen ibuprofen (ADVIL,MOTRIN) 200 MG tablet   Oral   Take 400 mg by mouth every 6 (six) hours as needed for pain.         Marland Kitchen lamoTRIgine (LAMICTAL) 150 MG tablet   Oral   Take 300 mg by mouth every morning.          . Testosterone (AXIRON) 30 MG/ACT SOLN   Transdermal   Place 1 application onto the skin every morning.         . zolpidem (AMBIEN) 10 MG tablet   Oral   Take 10 mg by mouth at bedtime as needed for sleep.          . diazepam (VALIUM) 5 MG tablet   Oral   Take 1 tablet (5 mg total) by mouth every 8 (eight) hours as needed for anxiety (muscle spasm).   10 tablet   0   . methocarbamol (ROBAXIN) 500 MG tablet   Oral   Take 1 tablet (500 mg total) by mouth 2 (two) times daily.   20 tablet   0   . naproxen (NAPROSYN) 500 MG tablet   Oral   Take 1 tablet (500 mg total) by mouth 2 (two) times daily.   30 tablet   0   . oxyCODONE-acetaminophen (PERCOCET) 10-325 MG per tablet   Oral   Take 1 tablet by mouth every 4 (four) hours as needed for pain.   30 tablet   0   . sildenafil (VIAGRA) 100 MG tablet   Oral   Take 1 tablet (100 mg total) by mouth as needed for erectile dysfunction.   12 tablet   2    BP 117/74  Pulse 61  Temp(Src) 98.2 F (36.8 C) (Oral)  Resp 16  SpO2 95% Physical Exam  Constitutional: He is oriented to person, place, and time. He appears well-developed and well-nourished. No distress.  HENT:  Head: Normocephalic.  Eyes: Conjunctivae are normal. Pupils are equal, round, and reactive to light. No scleral icterus.    Neck: Normal range of motion. Neck supple. No thyromegaly present.  Cardiovascular: Normal rate and regular rhythm.  Exam reveals no gallop and no friction  rub.   No murmur heard. Pulmonary/Chest: Effort normal and breath sounds normal. No respiratory distress. He has no wheezes. He has no rales.  Abdominal: Soft. Bowel sounds are normal. He exhibits no distension. There is no tenderness. There is no rebound.  Musculoskeletal: Normal range of motion.       Back:       Legs: Area of radiation of pain  Neurological: He is alert and oriented to person, place, and time.  Normal symmetric Strength to shoulder shrug, triceps, biceps, grip,wrist flex/extend,and intrinsics  Norma lsymmetric sensation above and below clavicles, and to all distributions to UEs. Norma symmetric strength to flex/.extend hip and knees, dorsi/plantar flex ankles. Normal symmetric sensation to all distributions to LEs Patellar and achilles reflexes 1-2+. Downgoing Babinski.  Is a great deal of pain that somewhat limits his effort with use of the left lower extremity but no obvious areas of weakness on exam.   Skin: Skin is warm and dry. No rash noted.  Psychiatric: He has a normal mood and affect. His behavior is normal.    ED Course  Procedures (including critical care time) Labs Review Labs Reviewed - No data to display Imaging Review Ct Abdomen Pelvis Wo Contrast  06/22/2013   CLINICAL DATA:  Left flank pain.  EXAM: CT ABDOMEN AND PELVIS WITHOUT CONTRAST  TECHNIQUE: Multidetector CT imaging of the abdomen and pelvis was performed following the standard protocol without intravenous contrast.  COMPARISON:  CT scan of March 22, 2013.  FINDINGS: Visualized lung bases appear normal. The liver, spleen and pancreas appear normal. No gallstones are noted. The appendix appears normal. Adrenal glands appear normal. No hydronephrosis or renal obstruction is noted. No renal or ureteral calculi are noted. No evidence of bowel  obstruction is noted. No abnormal fluid collection is noted. Urinary bladder appears normal.  IMPRESSION: No abnormality seen in the abdomen or pelvis.   Electronically Signed   By: Roque Lias M.D.   On: 06/22/2013 16:01   Dg Lumbar Spine Complete  06/22/2013   CLINICAL DATA:  Low back pain  EXAM: LUMBAR SPINE - COMPLETE 4+ VIEW  COMPARISON:  None.  FINDINGS: Vertebral body height is well maintained. Mild osteophytic changes are seen. No spondylolysis or spondylolisthesis is seen.  IMPRESSION: Mild degenerative change without acute abnormality.   Electronically Signed   By: Alcide Clever M.D.   On: 06/22/2013 14:32   Mr Lumbar Spine Wo Contrast  06/23/2013   CLINICAL DATA:  Low back pain.  Acute onset urinary retention.  EXAM: MRI LUMBAR SPINE WITHOUT CONTRAST  TECHNIQUE: Multiplanar, multisequence MR imaging was performed. No intravenous contrast was administered.  COMPARISON:  CT abdomen and pelvis 06/22/2013.  FINDINGS: Vertebral body height, signal and alignment are normal. No pars interarticularis defect is identified. The conus medullaris is normal in signal and position. Imaged intra-abdominal contents are unremarkable.  The T11-12 and T12-L1 levels are imaged in the sagittal plane only and negative.  L1-2:  Negative.  L2-3: Shallow disc bulge and mild facet degenerative disease. The central spinal canal and foramina remain open.  L3-4: Negative.  L4-5: Negative.  L5-S1: Shallow central protrusion without central canal or foraminal stenosis.  IMPRESSION: No finding to explain the patient's symptoms. Mild disk bulge is seen at L2-3 and there is a shallow central protrusion at L5-S1 without central canal or foraminal narrowing.   Electronically Signed   By: Drusilla Kanner M.D.   On: 06/23/2013 12:23    EKG Interpretation   None  MDM   1. Back pain   2. Urinary retention    MRI shows minimal changes. No disc herniation. I think his pain is musculoskeletal. His urinary retention is  likely prostate related. He has made follow up with a lites urology of encouraged him to continue with his. His pain is under better control. Plan is home anti-inflammatories pain medicines muscle relaxants followup with urology for removal of catheter.   Roney Marion, MD 06/23/13 1335

## 2013-06-23 NOTE — ED Notes (Signed)
Pt back from MRI 

## 2013-06-24 ENCOUNTER — Telehealth: Payer: Self-pay | Admitting: Internal Medicine

## 2013-06-24 LAB — URINE CULTURE
Colony Count: NO GROWTH
Culture: NO GROWTH

## 2013-06-24 NOTE — Telephone Encounter (Signed)
Pt was seen in the ed for a pinched nerve and pulled muscle. Pt was given oxyCODONE-acetaminophen (PERCOCET) 10-325 MG per tablet Pt states that is not strong enough and is requesting something stronger. Advised pt he would need an appt, but pt states he cannot get in here,he can't drive..  Pt has several appts next week. pls advise

## 2013-06-24 NOTE — Telephone Encounter (Signed)
Spoke to pt told him Dr. Amador Cunas said there is nothing stronger we can prescribe for you and if you are in that much pain you can go back to the ED. Pt verbalized understanding.

## 2013-06-29 ENCOUNTER — Other Ambulatory Visit: Payer: Self-pay | Admitting: *Deleted

## 2013-06-29 ENCOUNTER — Encounter: Payer: Self-pay | Admitting: Gastroenterology

## 2013-06-29 ENCOUNTER — Other Ambulatory Visit: Payer: Self-pay

## 2013-06-29 ENCOUNTER — Ambulatory Visit (INDEPENDENT_AMBULATORY_CARE_PROVIDER_SITE_OTHER): Payer: 59 | Admitting: Gastroenterology

## 2013-06-29 VITALS — BP 110/70 | HR 88 | Ht 69.0 in | Wt 198.5 lb

## 2013-06-29 DIAGNOSIS — K3184 Gastroparesis: Secondary | ICD-10-CM | POA: Insufficient documentation

## 2013-06-29 MED ORDER — POLYETHYLENE GLYCOL 3350 17 GM/SCOOP PO POWD: 17.0000 g | Freq: Every day | ORAL | Status: DC

## 2013-06-29 MED ORDER — HYDROCODONE-ACETAMINOPHEN 7.5-325 MG PO TABS: 1.0000 | ORAL_TABLET | Freq: Four times a day (QID) | ORAL | Status: DC | PRN

## 2013-06-29 MED ORDER — METOCLOPRAMIDE HCL 5 MG PO TABS: 5.0000 mg | ORAL_TABLET | Freq: Four times a day (QID) | ORAL | Status: DC

## 2013-06-29 NOTE — Telephone Encounter (Signed)
Prescription was given verbally over the phone

## 2013-06-29 NOTE — Patient Instructions (Addendum)
One of your biggest health concerns is your smoking.  This increases your risk for most cancers and serious cardiovascular diseases such as strokes, heart attacks.  You should try your best to stop.  If you need assistance, please contact your PCP or Smoking Cessation Class at [facility] [phone]) or [facility] Quit-Line (1-800-QUIT-NOW). Try to limit narcotic pain meds as best as possible. Start miralax powder, one dose every single day.  (take 2-3 doses today to help purge your bowels a bit). Change reglan dosing (should take 5mg  prior to every meal, 10mg  at bedtime). Please return to see Dr. Christella Hartigan in one month.

## 2013-06-29 NOTE — Progress Notes (Signed)
Review of pertinent gastrointestinal problems:  1. routine risk for colon cancer, colonoscopy March 2014 found diverticulosis only. Recommended for recall colonoscopy at 10 year interval  2. Nausea, dysphagia, weight loss?. Gastroparesis+ : EGD 03/2013 found H. Pylori Neg gastritis, + retained food in stomach. GES 04/2013 confirmed gastroparesis.  05/2013 Reglan 5mg  tid started. 3. Left flank pain 10/14 underwent non-contrast CT scan abd/pelvis; was normal   HPI: This is a  very pleasant 42 year old man who is here with his wife today.  A lot of back pains lately.  Requiring narcotics for it, has been about a week.  He has been on reglan 5mg  three times daily before meals.  Tries to eat small meals several times daily.    Still nauseous most of the time, worse after meals, especially bad at night. He is not vomiting. His weight is essentially stable in the past month.   Past Medical History  Diagnosis Date  . TESTICULAR HYPOFUNCTION 06/12/2009  . SMOKER 06/12/2009  . DEPRESSION 05/10/2009  . SLEEP APNEA, OBSTRUCTIVE 06/12/2009  . ALLERGIC RHINITIS 05/10/2009  . GERD 05/10/2009  . WEIGHT GAIN 05/10/2009  . LIBIDO, DECREASED 05/10/2009  . Osgood-Schlatter's disease   . Anxiety   . Chronic headaches   . Gastroparesis     Past Surgical History  Procedure Laterality Date  . Lasik    . Ankle surgery Left   . Colonoscopy  2014    normal    Current Outpatient Prescriptions  Medication Sig Dispense Refill  . ALPRAZolam (XANAX) 1 MG tablet Take 1 mg by mouth 3 (three) times daily as needed for anxiety.       . Asenapine Maleate (SAPHRIS) 10 MG SUBL Place 2 tablets under the tongue every evening.       . diazepam (VALIUM) 5 MG tablet Take 1 tablet (5 mg total) by mouth 2 (two) times daily.  10 tablet  0  . diphenhydrAMINE (BENADRYL) 25 MG tablet Take 25 mg by mouth every 6 (six) hours as needed for allergies.       Marland Kitchen HYDROcodone-acetaminophen (NORCO/VICODIN) 5-325 MG per tablet Take 1-2  tablets by mouth every 4 (four) hours as needed for pain.      Marland Kitchen ibuprofen (ADVIL,MOTRIN) 200 MG tablet Take 400 mg by mouth every 6 (six) hours as needed for pain.      Marland Kitchen lamoTRIgine (LAMICTAL) 150 MG tablet Take 300 mg by mouth every morning.       . methocarbamol (ROBAXIN) 500 MG tablet Take 1 tablet (500 mg total) by mouth 2 (two) times daily.  20 tablet  0  . metoCLOPramide (REGLAN) 5 MG/5ML solution       . naproxen (NAPROSYN) 500 MG tablet Take 1 tablet (500 mg total) by mouth 2 (two) times daily.  30 tablet  0  . oxyCODONE-acetaminophen (PERCOCET) 10-325 MG per tablet Take 1 tablet by mouth every 4 (four) hours as needed for pain.  30 tablet  0  . prochlorperazine (COMPAZINE) 10 MG tablet       . sildenafil (VIAGRA) 100 MG tablet Take 1 tablet (100 mg total) by mouth as needed for erectile dysfunction.  12 tablet  2  . Testosterone (AXIRON) 30 MG/ACT SOLN Place 1 application onto the skin every morning.      . zolpidem (AMBIEN) 10 MG tablet Take 10 mg by mouth at bedtime as needed for sleep.       . [DISCONTINUED] omeprazole (PRILOSEC OTC) 20 MG tablet 1 tablet twice daily.  Do not eat for one hour after ingestion of the medication  60 tablet  11   No current facility-administered medications for this visit.    Allergies as of 06/29/2013  . (No Known Allergies)    Family History  Problem Relation Age of Onset  . Heart disease Maternal Grandfather   . Irritable bowel syndrome Father   . Alcoholism Father   . Heart attack Father   . Colon cancer Neg Hx   . Rectal cancer Neg Hx   . Stomach cancer Neg Hx     History   Social History  . Marital Status: Married    Spouse Name: N/A    Number of Children: 0  . Years of Education: N/A   Occupational History  . sales    Social History Main Topics  . Smoking status: Current Every Day Smoker -- 0.50 packs/day for 15 years    Types: Cigarettes  . Smokeless tobacco: Former Neurosurgeon    Types: Chew    Quit date: 09/01/1998  .  Alcohol Use: Yes     Comment: social  . Drug Use: No  . Sexual Activity: Not on file   Other Topics Concern  . Not on file   Social History Narrative  . No narrative on file      Physical Exam: BP 110/70  Pulse 88  Ht 5\' 9"  (1.753 m)  Wt 198 lb 8 oz (90.039 kg)  BMI 29.3 kg/m2 Constitutional: generally well-appearing Psychiatric: alert and oriented x3 Abdomen: soft, nontender, nondistended, no obvious ascites, no peritoneal signs, normal bowel sounds     Assessment and plan: 42 y.o. male with gastroparesis  His current narcotic pain medicines certainly make his gastroparesis worsen he'll try to limit as best he can. They're also making constipated for which I recommended MiraLax. I am changing the dosing of his Reglan so that he takes 5 mg before every meal and 10 mg at bedtime. He will return to see me in 4 weeks and sooner if needed.

## 2013-06-29 NOTE — Telephone Encounter (Signed)
Pt walked into office wanting more pain medication for back pain. Discussed with Dr. Kirtland Bouchard verbal order given for Hydrocodone 7.5-325 mg one every 6 hours prn, disp # 60, refill 0. Pt has follow up appointment on Nov 4th. Rx printed and signed, given to pt.

## 2013-06-30 ENCOUNTER — Encounter (HOSPITAL_COMMUNITY): Payer: Self-pay | Admitting: Emergency Medicine

## 2013-06-30 ENCOUNTER — Emergency Department (HOSPITAL_COMMUNITY)
Admission: EM | Admit: 2013-06-30 | Discharge: 2013-06-30 | Disposition: A | Discharge status: Home / Self Care | Payer: 59 | Attending: Emergency Medicine | Admitting: Emergency Medicine

## 2013-06-30 DIAGNOSIS — F3289 Other specified depressive episodes: Secondary | ICD-10-CM | POA: Insufficient documentation

## 2013-06-30 DIAGNOSIS — K219 Gastro-esophageal reflux disease without esophagitis: Secondary | ICD-10-CM | POA: Insufficient documentation

## 2013-06-30 DIAGNOSIS — Z8639 Personal history of other endocrine, nutritional and metabolic disease: Secondary | ICD-10-CM | POA: Insufficient documentation

## 2013-06-30 DIAGNOSIS — Z862 Personal history of diseases of the blood and blood-forming organs and certain disorders involving the immune mechanism: Secondary | ICD-10-CM | POA: Insufficient documentation

## 2013-06-30 DIAGNOSIS — F17200 Nicotine dependence, unspecified, uncomplicated: Secondary | ICD-10-CM | POA: Insufficient documentation

## 2013-06-30 DIAGNOSIS — F411 Generalized anxiety disorder: Secondary | ICD-10-CM | POA: Insufficient documentation

## 2013-06-30 DIAGNOSIS — K5900 Constipation, unspecified: Secondary | ICD-10-CM

## 2013-06-30 DIAGNOSIS — Z79899 Other long term (current) drug therapy: Secondary | ICD-10-CM | POA: Insufficient documentation

## 2013-06-30 DIAGNOSIS — Z8669 Personal history of other diseases of the nervous system and sense organs: Secondary | ICD-10-CM | POA: Insufficient documentation

## 2013-06-30 DIAGNOSIS — K3184 Gastroparesis: Secondary | ICD-10-CM | POA: Insufficient documentation

## 2013-06-30 DIAGNOSIS — F329 Major depressive disorder, single episode, unspecified: Secondary | ICD-10-CM | POA: Insufficient documentation

## 2013-06-30 DIAGNOSIS — Z791 Long term (current) use of non-steroidal anti-inflammatories (NSAID): Secondary | ICD-10-CM | POA: Insufficient documentation

## 2013-06-30 DIAGNOSIS — M549 Dorsalgia, unspecified: Secondary | ICD-10-CM | POA: Insufficient documentation

## 2013-06-30 MED ORDER — POLYETHYLENE GLYCOL 3350 17 GM/SCOOP PO POWD: 17.0000 g | Freq: Two times a day (BID) | ORAL | Status: DC

## 2013-06-30 MED ORDER — FLEET ENEMA 7-19 GM/118ML RE ENEM
1.0000 | ENEMA | Freq: Once | RECTAL | Status: AC
  Administered 2013-06-30: 1 via RECTAL
  Filled 2013-06-30: qty 1

## 2013-06-30 NOTE — ED Provider Notes (Signed)
Medical screening examination/treatment/procedure(s) were performed by non-physician practitioner and as supervising physician I was immediately available for consultation/collaboration.  EKG Interpretation   None         Jama Krichbaum, MD 06/30/13 1513 

## 2013-06-30 NOTE — ED Notes (Signed)
After enema pt had moderate amount of stool and states "it was a success". Rob PA notified.

## 2013-06-30 NOTE — ED Notes (Signed)
Pt states is constipated, was placed on pain medication for back pain last week, states he thinks that is what is causing this, been a week since last normal BM, now leaking around hard stool, states "I have a piece sticking half in and half out".

## 2013-06-30 NOTE — ED Provider Notes (Signed)
CSN: [id number]     Arrival date & time 06/30/13  0848 History   First MD Initiated Contact with Patient 06/30/13 [phone]     Chief Complaint  Patient presents with  . Constipation   (Consider location/radiation/quality/duration/timing/severity/associated sxs/prior Treatment) HPI Comments: Patient presents to the emergency department with chief complaint of constipation. He states that he has felt constipated for approximately one week. He states that today he has noticed some leaking of the stool. He states that it feels like "I have a piece sticking half in and half out." He endorses taking Vicodin for back pain. Denies loss of bowel or bladder control. He states that the leaking only occurs after he first stands up from using the bathroom, but he does not leak while going through his daily activities. Denies saddle anesthesia. Denies difficulty walking.  The history is provided by the patient. No language interpreter was used.    Past Medical History  Diagnosis Date  . TESTICULAR HYPOFUNCTION 06/12/2009  . SMOKER 06/12/2009  . DEPRESSION 05/10/2009  . SLEEP APNEA, OBSTRUCTIVE 06/12/2009  . ALLERGIC RHINITIS 05/10/2009  . GERD 05/10/2009  . WEIGHT GAIN 05/10/2009  . LIBIDO, DECREASED 05/10/2009  . Osgood-Schlatter's disease   . Anxiety   . Chronic headaches   . Gastroparesis    Past Surgical History  Procedure Laterality Date  . Lasik    . Ankle surgery Left   . Colonoscopy  2014    normal   Family History  Problem Relation Age of Onset  . Heart disease Maternal Grandfather   . Irritable bowel syndrome Father   . Alcoholism Father   . Heart attack Father   . Colon cancer Neg Hx   . Rectal cancer Neg Hx   . Stomach cancer Neg Hx    History  Substance Use Topics  . Smoking status: Current Every Day Smoker -- 0.50 packs/day for 15 years    Types: Cigarettes  . Smokeless tobacco: Former Neurosurgeon    Types: Chew    Quit date: 09/01/1998  . Alcohol Use: Yes     Comment: social     Review of Systems  All other systems reviewed and are negative.    Allergies  Review of patient's allergies indicates no known allergies.  Home Medications   Current Outpatient Rx  Name  Route  Sig  Dispense  Refill  . ALPRAZolam (XANAX) 1 MG tablet   Oral   Take 1 mg by mouth 3 (three) times daily as needed for anxiety.          . Asenapine Maleate (SAPHRIS) 10 MG SUBL   Sublingual   Place 2 tablets under the tongue every evening.          . diazepam (VALIUM) 5 MG tablet   Oral   Take 1 tablet (5 mg total) by mouth 2 (two) times daily.   10 tablet   0   . diphenhydrAMINE (BENADRYL) 25 MG tablet   Oral   Take 25 mg by mouth every 6 (six) hours as needed for allergies.          Marland Kitchen HYDROcodone-acetaminophen (NORCO) 7.5-325 MG per tablet   Oral   Take 1 tablet by mouth every 6 (six) hours as needed for pain.   60 tablet   0   . ibuprofen (ADVIL,MOTRIN) 200 MG tablet   Oral   Take 400 mg by mouth every 6 (six) hours as needed for pain.         Marland Kitchen  lamoTRIgine (LAMICTAL) 150 MG tablet   Oral   Take 300 mg by mouth every morning.          . methocarbamol (ROBAXIN) 500 MG tablet   Oral   Take 1 tablet (500 mg total) by mouth 2 (two) times daily.   20 tablet   0   . metoCLOPramide (REGLAN) 5 MG tablet   Oral   Take 1 tablet (5 mg total) by mouth 4 (four) times daily. Take one pill before meal three times per day. Take 2 pills at bedtime every night.   150 tablet   4   . naproxen (NAPROSYN) 500 MG tablet   Oral   Take 1 tablet (500 mg total) by mouth 2 (two) times daily.   30 tablet   0   . polyethylene glycol powder (GLYCOLAX/MIRALAX) powder   Oral   Take 17 g by mouth daily.   255 g   3   . prochlorperazine (COMPAZINE) 10 MG tablet               . Testosterone (AXIRON) 30 MG/ACT SOLN   Transdermal   Place 1 application onto the skin every morning.         . zolpidem (AMBIEN) 10 MG tablet   Oral   Take 10 mg by mouth at bedtime as  needed for sleep.           BP 121/78  Pulse 106  Temp(Src) 98.6 F (37 C) (Oral)  Resp 20  SpO2 97% Physical Exam  Nursing note and vitals reviewed. Constitutional: He is oriented to person, place, and time. He appears well-developed and well-nourished.  HENT:  Head: Normocephalic and atraumatic.  Eyes: Conjunctivae and EOM are normal.  Neck: Normal range of motion.  Cardiovascular: Normal rate.   Pulmonary/Chest: Effort normal.  Abdominal: He exhibits no distension.  Genitourinary:  Rectal exam remarkable for a large stool ball, no palpable hemorrhoids, normal rectal tone, no gross blood  Musculoskeletal: Normal range of motion.  Neurological: He is alert and oriented to person, place, and time.  Skin: Skin is dry.  Psychiatric: He has a normal mood and affect. His behavior is normal. Judgment and thought content normal.    ED Course  Procedures (including critical care time) Labs Review Labs Reviewed  OCCULT BLOOD X 1 CARD TO LAB, STOOL   I.labs  EKG Interpretation   None       MDM   1. Constipation     Patient with constipation a large stool ball. Will treat with fleets enema, and will reevaluate.  11:43 AM The patient had successful bowel movement following fleets enema. States that he feels much better, and is ready to go home. Will discharge to home with stool softener. Advised to ease up on the narcotics. Patient is stable and ready for discharge.    Roxy Horseman, PA-C 06/30/13 1143

## 2013-07-05 ENCOUNTER — Ambulatory Visit (INDEPENDENT_AMBULATORY_CARE_PROVIDER_SITE_OTHER): Payer: 59 | Admitting: Internal Medicine

## 2013-07-05 ENCOUNTER — Encounter: Payer: Self-pay | Admitting: Internal Medicine

## 2013-07-05 ENCOUNTER — Other Ambulatory Visit: Payer: Self-pay | Admitting: Internal Medicine

## 2013-07-05 VITALS — BP 136/84 | HR 102 | Temp 97.8°F | Resp 20 | Wt 207.0 lb

## 2013-07-05 DIAGNOSIS — M549 Dorsalgia, unspecified: Secondary | ICD-10-CM

## 2013-07-05 MED ORDER — METHYLPREDNISOLONE ACETATE 80 MG/ML IJ SUSP
80.0000 mg | Freq: Once | INTRAMUSCULAR | Status: AC
  Administered 2013-07-05: 80 mg via INTRAMUSCULAR

## 2013-07-05 MED ORDER — TRAMADOL HCL 50 MG PO TABS: 50.0000 mg | ORAL_TABLET | Freq: Three times a day (TID) | ORAL | Status: DC | PRN

## 2013-07-05 NOTE — Patient Instructions (Signed)
Most patients with low back pain will improve with time over the next two to 6 weeks.  Keep active but avoid any activities that cause pain.  Apply moist heat to the low back area several times daily.Back Injury Prevention Back injuries can be extremely painful and difficult to heal. After having one back injury, you are much more likely to experience another later on. It is important to learn how to avoid injuring or re-injuring your back. The following tips can help you to prevent a back injury. PHYSICAL FITNESS  Exercise regularly and try to develop good tone in your abdominal muscles. Your abdominal muscles provide a lot of the support needed by your back.  Do aerobic exercises (walking, jogging, biking, swimming) regularly.  Do exercises that increase balance and strength (tai chi, yoga) regularly. This can decrease your risk of falling and injuring your back.  Stretch before and after exercising.  Maintain a healthy weight. The more you weigh, the more stress is placed on your back. For every pound of weight, 10 times that amount of pressure is placed on the back. DIET  Talk to your caregiver about how much calcium and vitamin D you need per day. These nutrients help to prevent weakening of the bones (osteoporosis). Osteoporosis can cause broken (fractured) bones that lead to back pain.  Include good sources of calcium in your diet, such as dairy products, green, leafy vegetables, and products with calcium added (fortified).  Include good sources of vitamin D in your diet, such as milk and foods that are fortified with vitamin D.  Consider taking a nutritional supplement or a multivitamin if needed.  Stop smoking if you smoke. POSTURE  Sit and stand up straight. Avoid leaning forward when you sit or hunching over when you stand.  Choose chairs with good low back (lumbar) support.  If you work at a desk, sit close to your work so you do not need to lean over. Keep your chin tucked  in. Keep your neck drawn back and elbows bent at a right angle. Your arms should look like the letter "L."  Sit high and close to the steering wheel when you drive. Add a lumbar support to your car seat if needed.  Avoid sitting or standing in one position for too long. Take breaks to get up, stretch, and walk around at least once every hour. Take breaks if you are driving for long periods of time.  Sleep on your side with your knees slightly bent, or sleep on your back with a pillow under your knees. Do not sleep on your stomach. LIFTING, TWISTING, AND REACHING  Avoid heavy lifting, especially repetitive lifting. If you must do heavy lifting:  Stretch before lifting.  Work slowly.  Rest between lifts.  Use carts and dollies to move objects when possible.  Make several small trips instead of carrying 1 heavy load.  Ask for help when you need it.  Ask for help when moving big, awkward objects.  Follow these steps when lifting:  Stand with your feet shoulder-width apart.  Get as close to the object as you can. Do not try to pick up heavy objects that are far from your body.  Use handles or lifting straps if they are available.  Bend at your knees. Squat down, but keep your heels off the floor.  Keep your shoulders pulled back, your chin tucked in, and your back straight.  Lift the object slowly, tightening the muscles in your legs, abdomen, and  buttocks. Keep the object as close to the center of your body as possible.  When you put a load down, use these same guidelines in reverse.  Do not:  Lift the object above your waist.  Twist at the waist while lifting or carrying a load. Move your feet if you need to turn, not your waist.  Bend over without bending at your knees.  Avoid reaching over your head, across a table, or for an object on a high surface. OTHER TIPS  Avoid wet floors and keep sidewalks clear of ice to prevent falls.  Do not sleep on a mattress that is  too soft or too hard.  Keep items that are used frequently within easy reach.  Put heavier objects on shelves at waist level and lighter objects on lower or higher shelves.  Find ways to decrease your stress, such as exercise, massage, or relaxation techniques. Stress can build up in your muscles. Tense muscles are more vulnerable to injury.  Seek treatment for depression or anxiety if needed. These conditions can increase your risk of developing back pain. SEEK MEDICAL CARE IF:  You injure your back.  You have questions about diet, exercise, or other ways to prevent back injuries. MAKE SURE YOU:  Understand these instructions.  Will watch your condition.  Will get help right away if you are not doing well or get worse. Document Released: 09/25/2004 Document Revised: 11/10/2011 Document Reviewed: 09/29/2011 [facility] Patient Information 2014 [place], [place]. Back Pain, Adult Low back pain is very common. About 1 in 5 people have back pain.The cause of low back pain is rarely dangerous. The pain often gets better over time.About half of people with a sudden onset of back pain feel better in just 2 weeks. About 8 in 10 people feel better by 6 weeks.  CAUSES Some common causes of back pain include:  Strain of the muscles or ligaments supporting the spine.  Wear and tear (degeneration) of the spinal discs.  Arthritis.  Direct injury to the back. DIAGNOSIS Most of the time, the direct cause of low back pain is not known.However, back pain can be treated effectively even when the exact cause of the pain is unknown.Answering your caregiver's questions about your overall health and symptoms is one of the most accurate ways to make sure the cause of your pain is not dangerous. If your caregiver needs more information, he or she may order lab work or imaging tests (X-rays or MRIs).However, even if imaging tests show changes in your back, this usually does not require surgery. HOME CARE  INSTRUCTIONS For many people, back pain returns.Since low back pain is rarely dangerous, it is often a condition that people can learn to [facility] their own.   Remain active. It is stressful on the back to sit or stand in one place. Do not sit, drive, or stand in one place for more than 30 minutes at a time. Take short walks on level surfaces as soon as pain allows.Try to increase the length of time you walk each day.  Do not stay in bed.Resting more than 1 or 2 days can delay your recovery.  Do not avoid exercise or work.Your body is made to move.It is not dangerous to be active, even though your back may hurt.Your back will likely heal faster if you return to being active before your pain is gone.  Pay attention to your body when you bend and lift. Many people have less discomfortwhen lifting if they bend their  knees, keep the load close to their bodies,and avoid twisting. Often, the most comfortable positions are those that put less stress on your recovering back.  Find a comfortable position to sleep. Use a firm mattress and lie on your side with your knees slightly bent. If you lie on your back, put a pillow under your knees.  Only take over-the-counter or prescription medicines as directed by your caregiver. Over-the-counter medicines to reduce pain and inflammation are often the most helpful.Your caregiver may prescribe muscle relaxant drugs.These medicines help dull your pain so you can more quickly return to your normal activities and healthy exercise.  Put ice on the injured area.  Put ice in a plastic bag.  Place a towel between your skin and the bag.  Leave the ice on for 15-20 minutes, 3-4 times a day for the first 2 to 3 days. After that, ice and heat may be alternated to reduce pain and spasms.  Ask your caregiver about trying back exercises and gentle massage. This may be of some benefit.  Avoid feeling anxious or stressed.Stress increases muscle tension and can  worsen back pain.It is important to recognize when you are anxious or stressed and learn ways to manage it.Exercise is a great option. SEEK MEDICAL CARE IF:  You have pain that is not relieved with rest or medicine.  You have pain that does not improve in 1 week.  You have new symptoms.  You are generally not feeling well. SEEK IMMEDIATE MEDICAL CARE IF:   You have pain that radiates from your back into your legs.  You develop new bowel or bladder control problems.  You have unusual weakness or numbness in your arms or legs.  You develop nausea or vomiting.  You develop abdominal pain.  You feel faint. Document Released: 08/18/2005 Document Revised: 02/17/2012 Document Reviewed: 01/06/2011 [facility] Patient Information 2014 [place], [place].

## 2013-07-05 NOTE — Progress Notes (Signed)
Subjective:    Patient ID: Sean Wu, male    DOB: Nov 22, 1970, 42 y.o.   MRN: [id number] Pre-visit discussion using our clinic review tool. No additional management support is needed unless otherwise documented below in the visit note.  HPI  42 year old patient who is seen today for followup of back pain. This has been present for about 2 weeks and seemed to occur after taking a new job that required heavy lifting. He was evaluated in the ED on 2 occasions and evaluation included radiographs of the LS-spine as well as a lumbar MRI. These revealed very minimal arthritic changes only. He continues to have pain in the left flank area. Evaluation also included a CT of the abdomen and pelvis. He is also seeing urology apparently for some urinary retention. He has also been evaluated by GI for her gastroparesis. He has been given hydrocodone which he says has not been very helpful;  he has no history of chronic low back pain. He was also evaluated in the ED most recently dated 2 constipation issues.  Emergency Department records reviewed Imaging studies reviewed  Past Medical History  Diagnosis Date  . TESTICULAR HYPOFUNCTION 06/12/2009  . SMOKER 06/12/2009  . DEPRESSION 05/10/2009  . SLEEP APNEA, OBSTRUCTIVE 06/12/2009  . ALLERGIC RHINITIS 05/10/2009  . GERD 05/10/2009  . WEIGHT GAIN 05/10/2009  . LIBIDO, DECREASED 05/10/2009  . Osgood-Schlatter's disease   . Anxiety   . Chronic headaches   . Gastroparesis     History   Social History  . Marital Status: Married    Spouse Name: N/A    Number of Children: 0  . Years of Education: N/A   Occupational History  . sales    Social History Main Topics  . Smoking status: Current Every Day Smoker -- 0.50 packs/day for 15 years    Types: Cigarettes  . Smokeless tobacco: Former Neurosurgeon    Types: Chew    Quit date: 09/01/1998  . Alcohol Use: Yes     Comment: social  . Drug Use: No  . Sexual Activity: Not on file   Other Topics Concern  . Not on  file   Social History Narrative  . No narrative on file    Past Surgical History  Procedure Laterality Date  . Lasik    . Ankle surgery Left   . Colonoscopy  2014    normal    Family History  Problem Relation Age of Onset  . Heart disease Maternal Grandfather   . Irritable bowel syndrome Father   . Alcoholism Father   . Heart attack Father   . Colon cancer Neg Hx   . Rectal cancer Neg Hx   . Stomach cancer Neg Hx     No Known Allergies  Current Outpatient Prescriptions on File Prior to Visit  Medication Sig Dispense Refill  . ALPRAZolam (XANAX) 1 MG tablet Take 1 mg by mouth 3 (three) times daily as needed for anxiety.       . Asenapine Maleate (SAPHRIS) 10 MG SUBL Place 2 tablets under the tongue every evening.       . diphenhydrAMINE (BENADRYL) 25 MG tablet Take 25 mg by mouth every 6 (six) hours as needed for allergies.       Marland Kitchen HYDROcodone-acetaminophen (NORCO) 7.5-325 MG per tablet Take 1 tablet by mouth every 6 (six) hours as needed for pain.  60 tablet  0  . ibuprofen (ADVIL,MOTRIN) 200 MG tablet Take 400 mg by mouth every 6 (six) hours  as needed for pain.      Marland Kitchen lamoTRIgine (LAMICTAL) 150 MG tablet Take 300 mg by mouth every morning.       . methocarbamol (ROBAXIN) 500 MG tablet Take 1 tablet (500 mg total) by mouth 2 (two) times daily.  20 tablet  0  . metoCLOPramide (REGLAN) 5 MG tablet Take 1 tablet (5 mg total) by mouth 4 (four) times daily. Take one pill before meal three times per day. Take 2 pills at bedtime every night.  150 tablet  4  . naproxen (NAPROSYN) 500 MG tablet Take 1 tablet (500 mg total) by mouth 2 (two) times daily.  30 tablet  0  . polyethylene glycol powder (GLYCOLAX/MIRALAX) powder Take 17 g by mouth daily.  255 g  3  . prochlorperazine (COMPAZINE) 10 MG tablet       . Testosterone (AXIRON) 30 MG/ACT SOLN Place 1 application onto the skin every morning.      . zolpidem (AMBIEN) 10 MG tablet Take 10 mg by mouth at bedtime as needed for sleep.        . [DISCONTINUED] omeprazole (PRILOSEC OTC) 20 MG tablet 1 tablet twice daily. Do not eat for one hour after ingestion of the medication  60 tablet  11   No current facility-administered medications on file prior to visit.    BP 136/84  Pulse 102  Temp(Src) 97.8 F (36.6 C) (Oral)  Resp 20  Wt 207 lb (93.895 kg)  SpO2 96%       Review of Systems  Constitutional: Negative for fever, chills, appetite change and fatigue.  HENT: Negative for congestion, dental problem, ear pain, hearing loss, sore throat, tinnitus, trouble swallowing and voice change.   Eyes: Negative for pain, discharge and visual disturbance.  Respiratory: Negative for cough, chest tightness, wheezing and stridor.   Cardiovascular: Negative for chest pain, palpitations and leg swelling.  Gastrointestinal: Positive for constipation. Negative for nausea, vomiting, abdominal pain, diarrhea, blood in stool and abdominal distention.  Genitourinary: Negative for urgency, hematuria, flank pain, discharge, difficulty urinating and genital sores.  Musculoskeletal: Positive for back pain. Negative for arthralgias, gait problem, joint swelling, myalgias and neck stiffness.  Skin: Negative for rash.  Neurological: Negative for dizziness, syncope, speech difficulty, weakness, numbness and headaches.  Hematological: Negative for adenopathy. Does not bruise/bleed easily.  Psychiatric/Behavioral: Negative for behavioral problems and dysphoric mood. The patient is not nervous/anxious.        Objective:   Physical Exam  Constitutional: He appears well-developed and well-nourished. No distress.  Abdominal: Soft. Bowel sounds are normal. He exhibits no distension. There is no tenderness.  Musculoskeletal:  Negative straight leg test Patellar and Achilles reflexes normal Normal muscle strength          Assessment & Plan:   Subacute low back and left flank pain. We'll continue supportive and symptomatic treatment. Was  treated with Depo-Medrol 80. Was also given a prescription for tramadol. He does have muscle relaxers as well as  alprazolam

## 2013-07-07 ENCOUNTER — Other Ambulatory Visit: Payer: Self-pay | Admitting: Internal Medicine

## 2013-07-12 ENCOUNTER — Encounter: Payer: Self-pay | Admitting: Internal Medicine

## 2013-07-12 ENCOUNTER — Ambulatory Visit (INDEPENDENT_AMBULATORY_CARE_PROVIDER_SITE_OTHER): Payer: 59 | Admitting: Internal Medicine

## 2013-07-12 VITALS — BP 140/90 | HR 92 | Temp 98.2°F | Resp 20 | Wt 202.0 lb

## 2013-07-12 DIAGNOSIS — M549 Dorsalgia, unspecified: Secondary | ICD-10-CM

## 2013-07-12 NOTE — Patient Instructions (Signed)
Physical therapy  Referral as discussedBack Injury Prevention Back injuries can be extremely painful and difficult to heal. After having one back injury, you are much more likely to experience another later on. It is important to learn how to avoid injuring or re-injuring your back. The following tips can help you to prevent a back injury. PHYSICAL FITNESS  Exercise regularly and try to develop good tone in your abdominal muscles. Your abdominal muscles provide a lot of the support needed by your back.  Do aerobic exercises (walking, jogging, biking, swimming) regularly.  Do exercises that increase balance and strength (tai chi, yoga) regularly. This can decrease your risk of falling and injuring your back.  Stretch before and after exercising.  Maintain a healthy weight. The more you weigh, the more stress is placed on your back. For every pound of weight, 10 times that amount of pressure is placed on the back. DIET  Talk to your caregiver about how much calcium and vitamin D you need per day. These nutrients help to prevent weakening of the bones (osteoporosis). Osteoporosis can cause broken (fractured) bones that lead to back pain.  Include good sources of calcium in your diet, such as dairy products, green, leafy vegetables, and products with calcium added (fortified).  Include good sources of vitamin D in your diet, such as milk and foods that are fortified with vitamin D.  Consider taking a nutritional supplement or a multivitamin if needed.  Stop smoking if you smoke. POSTURE  Sit and stand up straight. Avoid leaning forward when you sit or hunching over when you stand.  Choose chairs with good low back (lumbar) support.  If you work at a desk, sit close to your work so you do not need to lean over. Keep your chin tucked in. Keep your neck drawn back and elbows bent at a right angle. Your arms should look like the letter "L."  Sit high and close to the steering wheel when you  drive. Add a lumbar support to your car seat if needed.  Avoid sitting or standing in one position for too long. Take breaks to get up, stretch, and walk around at least once every hour. Take breaks if you are driving for long periods of time.  Sleep on your side with your knees slightly bent, or sleep on your back with a pillow under your knees. Do not sleep on your stomach. LIFTING, TWISTING, AND REACHING  Avoid heavy lifting, especially repetitive lifting. If you must do heavy lifting:  Stretch before lifting.  Work slowly.  Rest between lifts.  Use carts and dollies to move objects when possible.  Make several small trips instead of carrying 1 heavy load.  Ask for help when you need it.  Ask for help when moving big, awkward objects.  Follow these steps when lifting:  Stand with your feet shoulder-width apart.  Get as close to the object as you can. Do not try to pick up heavy objects that are far from your body.  Use handles or lifting straps if they are available.  Bend at your knees. Squat down, but keep your heels off the floor.  Keep your shoulders pulled back, your chin tucked in, and your back straight.  Lift the object slowly, tightening the muscles in your legs, abdomen, and buttocks. Keep the object as close to the center of your body as possible.  When you put a load down, use these same guidelines in reverse.  Do not:  Lift the  object above your waist.  Twist at the waist while lifting or carrying a load. Move your feet if you need to turn, not your waist.  Bend over without bending at your knees.  Avoid reaching over your head, across a table, or for an object on a high surface. OTHER TIPS  Avoid wet floors and keep sidewalks clear of ice to prevent falls.  Do not sleep on a mattress that is too soft or too hard.  Keep items that are used frequently within easy reach.  Put heavier objects on shelves at waist level and lighter objects on lower  or higher shelves.  Find ways to decrease your stress, such as exercise, massage, or relaxation techniques. Stress can build up in your muscles. Tense muscles are more vulnerable to injury.  Seek treatment for depression or anxiety if needed. These conditions can increase your risk of developing back pain. SEEK MEDICAL CARE IF:  You injure your back.  You have questions about diet, exercise, or other ways to prevent back injuries. MAKE SURE YOU:  Understand these instructions.  Will watch your condition.  Will get help right away if you are not doing well or get worse. Document Released: 09/25/2004 Document Revised: 11/10/2011 Document Reviewed: 09/29/2011 [facility] Patient Information 2014 [place], [place]. Lumbosacral Strain Lumbosacral strain is one of the most common causes of back pain. There are many causes of back pain. Most are not serious conditions. CAUSES  Your backbone (spinal column) is made up of 24 main vertebral bodies, the sacrum, and the coccyx. These are held together by muscles and tough, fibrous tissue (ligaments). Nerve roots pass through the openings between the vertebrae. A sudden move or injury to the back may cause injury to, or pressure on, these nerves. This may result in localized back pain or pain movement (radiation) into the buttocks, down the leg, and into the foot. Sharp, shooting pain from the buttock down the back of the leg (sciatica) is frequently associated with a ruptured (herniated) disk. Pain may be caused by muscle spasm alone. Your caregiver can often find the cause of your pain by the details of your symptoms and an exam. In some cases, you may need tests (such as X-rays). Your caregiver will work with you to decide if any tests are needed based on your specific exam. HOME CARE INSTRUCTIONS   Avoid an underactive lifestyle. Active exercise, as directed by your caregiver, is your greatest weapon against back pain.  Avoid hard physical activities  (tennis, racquetball, waterskiing) if you are not in proper physical condition for it. This may aggravate or create problems.  If you have a back problem, avoid sports requiring sudden body movements. Swimming and walking are generally safer activities.  Maintain good posture.  Avoid becoming overweight (obese).  Use bed rest for only the most extreme, sudden (acute) episode. Your caregiver will help you determine how much bed rest is necessary.  For acute conditions, you may put ice on the injured area.  Put ice in a plastic bag.  Place a towel between your skin and the bag.  Leave the ice on for 15-20 minutes at a time, every 2 hours, or as needed.  After you are improved and more active, it may help to apply heat for 30 minutes before activities. See your caregiver if you are having pain that lasts longer than expected. Your caregiver can advise appropriate exercises or therapy if needed. With conditioning, most back problems can be avoided. SEEK IMMEDIATE MEDICAL CARE  IF:   You have numbness, tingling, weakness, or problems with the use of your arms or legs.  You experience severe back pain not relieved with medicines.  There is a change in bowel or bladder control.  You have increasing pain in any area of the body, including your belly (abdomen).  You notice shortness of breath, dizziness, or feel faint.  You feel sick to your stomach (nauseous), are throwing up (vomiting), or become sweaty.  You notice discoloration of your toes or legs, or your feet get very cold.  Your back pain is getting worse.  You have a fever. MAKE SURE YOU:   Understand these instructions.  Will watch your condition.  Will get help right away if you are not doing well or get worse. Document Released: 05/28/2005 Document Revised: 11/10/2011 Document Reviewed: 11/17/2008 [facility] Patient Information 2014 [place], [place].

## 2013-07-12 NOTE — Progress Notes (Signed)
Subjective:    Patient ID: Sean Wu, male    DOB: 11-28-1970, 42 y.o.   MRN: [id number]  HPI  42 year old patient who is seen today for persistent low back pain. He was seen one week ago and has now been symptomatic for about 3 weeks. He has not to return to work for the past 2-1/2 weeks. He describes pain in the lumbar region more on the left as well as left leg tingling down to the ankle area. He was seen in the ED and did have a lumbar MRI that was unremarkable. He has been treated with analgesics muscle relaxers and anti-inflammatory medications. He does have a history of gastroparesis  Past Medical History  Diagnosis Date  . TESTICULAR HYPOFUNCTION 06/12/2009  . SMOKER 06/12/2009  . DEPRESSION 05/10/2009  . SLEEP APNEA, OBSTRUCTIVE 06/12/2009  . ALLERGIC RHINITIS 05/10/2009  . GERD 05/10/2009  . WEIGHT GAIN 05/10/2009  . LIBIDO, DECREASED 05/10/2009  . Osgood-Schlatter's disease   . Anxiety   . Chronic headaches   . Gastroparesis     History   Social History  . Marital Status: Married    Spouse Name: N/A    Number of Children: 0  . Years of Education: N/A   Occupational History  . sales    Social History Main Topics  . Smoking status: Current Every Day Smoker -- 0.50 packs/day for 15 years    Types: Cigarettes  . Smokeless tobacco: Former Neurosurgeon    Types: Chew    Quit date: 09/01/1998  . Alcohol Use: Yes     Comment: social  . Drug Use: No  . Sexual Activity: Not on file   Other Topics Concern  . Not on file   Social History Narrative  . No narrative on file    Past Surgical History  Procedure Laterality Date  . Lasik    . Ankle surgery Left   . Colonoscopy  2014    normal    Family History  Problem Relation Age of Onset  . Heart disease Maternal Grandfather   . Irritable bowel syndrome Father   . Alcoholism Father   . Heart attack Father   . Colon cancer Neg Hx   . Rectal cancer Neg Hx   . Stomach cancer Neg Hx     No Known Allergies  Current  Outpatient Prescriptions on File Prior to Visit  Medication Sig Dispense Refill  . ALPRAZolam (XANAX) 1 MG tablet Take 1 mg by mouth 3 (three) times daily as needed for anxiety.       . Asenapine Maleate (SAPHRIS) 10 MG SUBL Place 2 tablets under the tongue every evening.       Randa Ngo 30 MG/ACT SOLN APPLY 2 APPLICATIONS ONTO THE SKIN EVERY MORNING  90 mL  1  . diphenhydrAMINE (BENADRYL) 25 MG tablet Take 25 mg by mouth every 6 (six) hours as needed for allergies.       Marland Kitchen HYDROcodone-acetaminophen (NORCO) 7.5-325 MG per tablet Take 1 tablet by mouth every 6 (six) hours as needed for pain.  60 tablet  0  . ibuprofen (ADVIL,MOTRIN) 200 MG tablet Take 400 mg by mouth every 6 (six) hours as needed for pain.      Marland Kitchen lamoTRIgine (LAMICTAL) 150 MG tablet Take 300 mg by mouth every morning.       . methocarbamol (ROBAXIN) 500 MG tablet Take 1 tablet (500 mg total) by mouth 2 (two) times daily.  20 tablet  0  . metoCLOPramide (  REGLAN) 5 MG tablet Take 1 tablet (5 mg total) by mouth 4 (four) times daily. Take one pill before meal three times per day. Take 2 pills at bedtime every night.  150 tablet  4  . naproxen (NAPROSYN) 500 MG tablet Take 1 tablet (500 mg total) by mouth 2 (two) times daily.  30 tablet  0  . polyethylene glycol powder (GLYCOLAX/MIRALAX) powder Take 17 g by mouth daily.  255 g  3  . prochlorperazine (COMPAZINE) 10 MG tablet       . prochlorperazine (COMPAZINE) 10 MG tablet TAKE 1 TABLET BY MOUTH EVERY 6 HOURS AS NEEDED  30 tablet  2  . traMADol (ULTRAM) 50 MG tablet Take 1 tablet (50 mg total) by mouth every 8 (eight) hours as needed.  60 tablet  0  . zolpidem (AMBIEN) 10 MG tablet Take 10 mg by mouth at bedtime as needed for sleep.       . [DISCONTINUED] omeprazole (PRILOSEC OTC) 20 MG tablet 1 tablet twice daily. Do not eat for one hour after ingestion of the medication  60 tablet  11   No current facility-administered medications on file prior to visit.    BP 140/90  Pulse 92   Temp(Src) 98.2 F (36.8 C) (Oral)  Resp 20  Wt 202 lb (91.627 kg)  SpO2 96%       Review of Systems  Constitutional: Negative for fever, chills, appetite change and fatigue.  HENT: Negative for congestion, dental problem, ear pain, hearing loss, sore throat, tinnitus, trouble swallowing and voice change.   Eyes: Negative for pain, discharge and visual disturbance.  Respiratory: Negative for cough, chest tightness, wheezing and stridor.   Cardiovascular: Negative for chest pain, palpitations and leg swelling.  Gastrointestinal: Negative for nausea, vomiting, abdominal pain, diarrhea, constipation, blood in stool and abdominal distention.  Genitourinary: Negative for urgency, hematuria, flank pain, discharge, difficulty urinating and genital sores.  Musculoskeletal: Positive for back pain. Negative for arthralgias, gait problem, joint swelling, myalgias and neck stiffness.  Skin: Negative for rash.  Neurological: Positive for numbness. Negative for dizziness, syncope, speech difficulty, weakness and headaches.  Hematological: Negative for adenopathy. Does not bruise/bleed easily.  Psychiatric/Behavioral: Negative for behavioral problems and dysphoric mood. The patient is not nervous/anxious.        Objective:   Physical Exam  Constitutional: He is oriented to person, place, and time. He appears well-developed.  HENT:  Head: Normocephalic.  Right Ear: External ear normal.  Left Ear: External ear normal.  Eyes: Conjunctivae and EOM are normal.  Neck: Normal range of motion.  Cardiovascular: Normal rate and normal heart sounds.   Pulmonary/Chest: Breath sounds normal.  Abdominal: Bowel sounds are normal.  Musculoskeletal: Normal range of motion. He exhibits no edema and no tenderness.  Straight leg test on the left as well as hip flexion tended to aggravate left lumbar discomfort Number weakness Reflexes somewhat suppressed but symmetrical Ankle flexion and extension normal   Neurological: He is alert and oriented to person, place, and time.  Psychiatric: He has a normal mood and affect. His behavior is normal.          Assessment & Plan:   Persistent low back pain. Options discussed including rheumatology referral.  Have elected to refer to physical therapy. Continue muscle relaxers and anti-inflammatories. Recheck here in 3-4 weeks

## 2013-07-15 ENCOUNTER — Ambulatory Visit: Payer: 59 | Attending: Internal Medicine | Admitting: Physical Therapy

## 2013-07-15 DIAGNOSIS — M545 Low back pain, unspecified: Secondary | ICD-10-CM | POA: Insufficient documentation

## 2013-07-15 DIAGNOSIS — R5381 Other malaise: Secondary | ICD-10-CM | POA: Insufficient documentation

## 2013-07-15 DIAGNOSIS — IMO0001 Reserved for inherently not codable concepts without codable children: Secondary | ICD-10-CM | POA: Insufficient documentation

## 2013-07-18 ENCOUNTER — Ambulatory Visit: Payer: 59 | Admitting: Physical Therapy

## 2013-07-19 ENCOUNTER — Ambulatory Visit: Payer: 59 | Admitting: Physical Therapy

## 2013-07-21 ENCOUNTER — Ambulatory Visit: Payer: 59 | Admitting: Physical Therapy

## 2013-07-26 ENCOUNTER — Telehealth: Payer: Self-pay | Admitting: Internal Medicine

## 2013-07-26 ENCOUNTER — Ambulatory Visit: Payer: 59 | Admitting: Physical Therapy

## 2013-07-26 DIAGNOSIS — M549 Dorsalgia, unspecified: Secondary | ICD-10-CM

## 2013-07-26 NOTE — Telephone Encounter (Signed)
[facility] thinks pt may need referral to come to them for his back issues<  Pt has appt 11/26 Pls Fax [phone]

## 2013-07-26 NOTE — Telephone Encounter (Signed)
Okay to do Ortho referral?

## 2013-07-27 NOTE — Telephone Encounter (Signed)
ok 

## 2013-08-01 ENCOUNTER — Ambulatory Visit: Payer: 59 | Admitting: Physical Therapy

## 2013-08-01 ENCOUNTER — Ambulatory Visit (INDEPENDENT_AMBULATORY_CARE_PROVIDER_SITE_OTHER): Payer: 59 | Admitting: Gastroenterology

## 2013-08-01 ENCOUNTER — Encounter: Payer: Self-pay | Admitting: Gastroenterology

## 2013-08-01 VITALS — BP 120/74 | HR 80 | Ht 69.0 in | Wt 200.6 lb

## 2013-08-01 DIAGNOSIS — K5900 Constipation, unspecified: Secondary | ICD-10-CM

## 2013-08-01 DIAGNOSIS — K3184 Gastroparesis: Secondary | ICD-10-CM

## 2013-08-01 MED ORDER — LINACLOTIDE 145 MCG PO CAPS: 145.0000 ug | ORAL_CAPSULE | Freq: Every day | ORAL | Status: DC

## 2013-08-01 NOTE — Progress Notes (Signed)
Review of pertinent gastrointestinal problems:  1. routine risk for colon cancer, colonoscopy March 2014 found diverticulosis only. Recommended for recall colonoscopy at 10 year interval  2. Nausea, dysphagia, weight loss?. Gastroparesis+ : EGD 03/2013 found H. Pylori Neg gastritis, + retained food in stomach. GES 04/2013 confirmed gastroparesis. 05/2013 Reglan 5mg  tid started without much help. 10/14 altered reglan dosing. 3. Left flank pain 10/14 underwent non-contrast CT scan abd/pelvis; was normal   HPI: This is a  very pleasant 42 year old man whom I last saw about a month ago.  He is off of narcotic pain medicines from what he tells me.  Currently taking reglan before every meal and 10mg  at bedtime.  Still has nausea rather chronically.  Still bothered by constipation, will have BM about every 5-6 days.  Takes miralax daily, every day.    Past Medical History  Diagnosis Date  . TESTICULAR HYPOFUNCTION 06/12/2009  . SMOKER 06/12/2009  . DEPRESSION 05/10/2009  . SLEEP APNEA, OBSTRUCTIVE 06/12/2009  . ALLERGIC RHINITIS 05/10/2009  . GERD 05/10/2009  . WEIGHT GAIN 05/10/2009  . LIBIDO, DECREASED 05/10/2009  . Osgood-Schlatter's disease   . Anxiety   . Chronic headaches   . Gastroparesis   . DDD (degenerative disc disease)     Past Surgical History  Procedure Laterality Date  . Lasik Bilateral   . Ankle surgery Left   . Colonoscopy  2014    normal    Current Outpatient Prescriptions  Medication Sig Dispense Refill  . ALPRAZolam (XANAX) 1 MG tablet Take 1 mg by mouth 3 (three) times daily as needed for anxiety.       . Asenapine Maleate (SAPHRIS) 10 MG SUBL Place 2 tablets under the tongue every evening.       Randa Ngo 30 MG/ACT SOLN APPLY 2 APPLICATIONS ONTO THE SKIN EVERY MORNING  90 mL  1  . diphenhydrAMINE (BENADRYL) 25 MG tablet Take 25 mg by mouth every 6 (six) hours as needed for allergies.       Marland Kitchen ibuprofen (ADVIL,MOTRIN) 200 MG tablet Take 400 mg by mouth every 6 (six)  hours as needed for pain.      Marland Kitchen lamoTRIgine (LAMICTAL) 150 MG tablet Take 300 mg by mouth every morning.       . methocarbamol (ROBAXIN) 500 MG tablet Take 1 tablet (500 mg total) by mouth 2 (two) times daily.  20 tablet  0  . metoCLOPramide (REGLAN) 5 MG tablet Take 1 tablet (5 mg total) by mouth 4 (four) times daily. Take one pill before meal three times per day. Take 2 pills at bedtime every night.  150 tablet  4  . naproxen (NAPROSYN) 500 MG tablet Take 1 tablet (500 mg total) by mouth 2 (two) times daily.  30 tablet  0  . polyethylene glycol powder (GLYCOLAX/MIRALAX) powder Take 17 g by mouth daily.  255 g  3  . prochlorperazine (COMPAZINE) 10 MG tablet TAKE 1 TABLET BY MOUTH EVERY 6 HOURS AS NEEDED  30 tablet  2  . traMADol (ULTRAM) 50 MG tablet Take 1 tablet (50 mg total) by mouth every 8 (eight) hours as needed.  60 tablet  0  . zolpidem (AMBIEN) 10 MG tablet Take 10 mg by mouth at bedtime as needed for sleep.       . [DISCONTINUED] omeprazole (PRILOSEC OTC) 20 MG tablet 1 tablet twice daily. Do not eat for one hour after ingestion of the medication  60 tablet  11   No current facility-administered medications  for this visit.    Allergies as of 08/01/2013  . (No Known Allergies)    Family History  Problem Relation Age of Onset  . Heart disease Maternal Grandfather   . Irritable bowel syndrome Father   . Alcoholism Father   . Heart attack Father   . Colon cancer Neg Hx   . Rectal cancer Neg Hx   . Stomach cancer Neg Hx     History   Social History  . Marital Status: Married    Spouse Name: N/A    Number of Children: 0  . Years of Education: N/A   Occupational History  . sales    Social History Main Topics  . Smoking status: Current Every Day Smoker -- 0.50 packs/day for 15 years    Types: Cigarettes  . Smokeless tobacco: Former Neurosurgeon    Types: Chew    Quit date: 09/01/1998     Comment: tobacco info given 08/01/13  . Alcohol Use: Yes     Comment: social  . Drug  Use: No  . Sexual Activity: Not on file   Other Topics Concern  . Not on file   Social History Narrative  . No narrative on file      Physical Exam: BP 120/74  Pulse 80  Ht 5\' 9"  (1.753 m)  Wt 200 lb 9.6 oz (90.992 kg)  BMI 29.61 kg/m2 Constitutional: generally well-appearing Psychiatric: alert and oriented x3 Abdomen: soft, nontender, nondistended, no obvious ascites, no peritoneal signs, normal bowel sounds     Assessment and plan: 42 y.o. male with constipation, nausea  Both of these are side effects of his tramadol. He also has confirmed gastroparesis. Previously he was on a top narcotics but he tell me that he stopped those which is unsure overall very good for his GI health.  He takes tramadol for back pain he says he can live without it and I advised him to try to do so. I am giving him samples of linz one pill once daily, low strength. He will call to report if symptoms in 7-10 days and if it is very helpful for his constipation we'll call in a prescription.ess,

## 2013-08-01 NOTE — Patient Instructions (Addendum)
One of your biggest health concerns is your smoking.  This increases your risk for most cancers and serious cardiovascular diseases such as strokes, heart attacks.  You should try your best to stop.  If you need assistance, please contact your PCP or Smoking Cessation Class at [facility] [phone]) or [facility] Quit-Line (1-800-QUIT-NOW). Tramadal #2 side effect is nausea, #3 side effect is constipation. Continue miralax once daily. Samples of linzess (low strength), take one pill once daily. Call in 10 days, if it helps then will write a prescription.

## 2013-08-03 ENCOUNTER — Ambulatory Visit: Payer: 59 | Attending: Internal Medicine | Admitting: Physical Therapy

## 2013-08-03 DIAGNOSIS — IMO0001 Reserved for inherently not codable concepts without codable children: Secondary | ICD-10-CM | POA: Insufficient documentation

## 2013-08-03 DIAGNOSIS — M545 Low back pain, unspecified: Secondary | ICD-10-CM | POA: Insufficient documentation

## 2013-08-03 DIAGNOSIS — R5381 Other malaise: Secondary | ICD-10-CM | POA: Insufficient documentation

## 2013-08-05 ENCOUNTER — Ambulatory Visit: Payer: 59 | Admitting: Physical Therapy

## 2013-08-08 ENCOUNTER — Ambulatory Visit: Payer: 59 | Admitting: Physical Therapy

## 2013-08-09 ENCOUNTER — Telehealth: Payer: Self-pay | Admitting: Gastroenterology

## 2013-08-09 MED ORDER — LINACLOTIDE 145 MCG PO CAPS: 145.0000 ug | ORAL_CAPSULE | Freq: Every day | ORAL | Status: DC

## 2013-08-09 NOTE — Telephone Encounter (Signed)
rx sent per pt request

## 2013-08-10 ENCOUNTER — Ambulatory Visit: Payer: 59 | Admitting: Physical Therapy

## 2013-08-11 ENCOUNTER — Other Ambulatory Visit: Payer: Self-pay | Admitting: Internal Medicine

## 2013-08-12 ENCOUNTER — Encounter: Payer: 59 | Admitting: Physical Therapy

## 2013-08-16 ENCOUNTER — Encounter: Payer: 59 | Admitting: Physical Therapy

## 2013-08-18 ENCOUNTER — Encounter: Payer: 59 | Admitting: Physical Therapy

## 2013-09-07 ENCOUNTER — Other Ambulatory Visit: Payer: Self-pay | Admitting: Gastroenterology

## 2013-09-16 ENCOUNTER — Other Ambulatory Visit: Payer: Self-pay | Admitting: Internal Medicine

## 2013-09-22 ENCOUNTER — Other Ambulatory Visit: Payer: Self-pay | Admitting: Gastroenterology

## 2013-10-02 ENCOUNTER — Other Ambulatory Visit: Payer: Self-pay | Admitting: Gastroenterology

## 2013-10-20 ENCOUNTER — Ambulatory Visit (INDEPENDENT_AMBULATORY_CARE_PROVIDER_SITE_OTHER): Payer: 59 | Admitting: Internal Medicine

## 2013-10-20 ENCOUNTER — Encounter: Payer: Self-pay | Admitting: Internal Medicine

## 2013-10-20 VITALS — BP 120/80 | HR 109 | Temp 97.7°F | Resp 20 | Ht 69.0 in | Wt 208.0 lb

## 2013-10-20 DIAGNOSIS — J029 Acute pharyngitis, unspecified: Secondary | ICD-10-CM

## 2013-10-20 DIAGNOSIS — K3184 Gastroparesis: Secondary | ICD-10-CM

## 2013-10-20 DIAGNOSIS — K219 Gastro-esophageal reflux disease without esophagitis: Secondary | ICD-10-CM

## 2013-10-20 MED ORDER — OMEPRAZOLE 20 MG PO CPDR: 20.0000 mg | DELAYED_RELEASE_CAPSULE | Freq: Every day | ORAL | Status: DC

## 2013-10-20 NOTE — Patient Instructions (Signed)
Viral Pharyngitis Viral pharyngitis is a viral infection that produces redness, pain, and swelling (inflammation) of the throat. It can spread from person to person (contagious). CAUSES Viral pharyngitis is caused by inhaling a large amount of certain germs called viruses. Many different viruses cause viral pharyngitis. SYMPTOMS Symptoms of viral pharyngitis include:  Sore throat.  Tiredness.  Stuffy nose.  Low-grade fever.  Congestion.  Cough. TREATMENT Treatment includes rest, drinking plenty of fluids, and the use of over-the-counter medication (approved by your caregiver). HOME CARE INSTRUCTIONS   Drink enough fluids to keep your urine clear or pale yellow.  Eat soft, cold foods such as ice cream, frozen ice pops, or gelatin dessert.  Gargle with warm salt water (1 tsp salt per 1 qt of water).  If over age 7, throat lozenges may be used safely.  Only take over-the-counter or prescription medicines for pain, discomfort, or fever as directed by your caregiver. Do not take aspirin. To help prevent spreading viral pharyngitis to others, avoid:  Mouth-to-mouth contact with others.  Sharing utensils for eating and drinking.  Coughing around others. SEEK MEDICAL CARE IF:   You are better in a few days, then become worse.  You have a fever or pain not helped by pain medicines.  There are any other changes that concern you. Document Released: 05/28/2005 Document Revised: 11/10/2011 Document Reviewed: 10/24/2010 Exit[employer] Patient Information 2014 [employer].  

## 2013-10-20 NOTE — Progress Notes (Signed)
Subjective:    Patient ID: Sean Wu, male    DOB: 1971/03/30, 43 y.o.   MRN: [id number]  HPI  43 year old patient, who presents today with a chief complaint of sore throat.  His wife has recently diagnosed with a viral pharyngitis.  He has been ill for 2 or 3 days.  He has had some mild congestion and minimal cough with sore throat as his predominant symptom.  No fever or cervical adenopathy.  He has been seen by GI and diagnosed with gastroparesis and now is on metoclopramide.  He is requesting a refill on omeprazole.  Past Medical History  Diagnosis Date  . TESTICULAR HYPOFUNCTION 06/12/2009  . SMOKER 06/12/2009  . DEPRESSION 05/10/2009  . SLEEP APNEA, OBSTRUCTIVE 06/12/2009  . ALLERGIC RHINITIS 05/10/2009  . GERD 05/10/2009  . WEIGHT GAIN 05/10/2009  . LIBIDO, DECREASED 05/10/2009  . Osgood-Schlatter's disease   . Anxiety   . Chronic headaches   . Gastroparesis   . DDD (degenerative disc disease)     History   Social History  . Marital Status: Married    Spouse Name: N/A    Number of Children: 0  . Years of Education: N/A   Occupational History  . sales    Social History Main Topics  . Smoking status: Current Every Day Smoker -- 0.50 packs/day for 15 years    Types: Cigarettes  . Smokeless tobacco: Former Systems developer    Types: [place] date: 09/01/1998     Comment: tobacco info given 08/01/13  . Alcohol Use: Yes     Comment: social  . Drug Use: No  . Sexual Activity: Not on file   Other Topics Concern  . Not on file   Social History Narrative  . No narrative on file    Past Surgical History  Procedure Laterality Date  . Lasik Bilateral   . Ankle surgery Left   . Colonoscopy  2014    normal    Family History  Problem Relation Age of Onset  . Heart disease Maternal Grandfather   . Irritable bowel syndrome Father   . Alcoholism Father   . Heart attack Father   . Colon cancer Neg Hx   . Rectal cancer Neg Hx   . Stomach cancer Neg Hx     No Known  Allergies  Current Outpatient Prescriptions on File Prior to Visit  Medication Sig Dispense Refill  . ALPRAZolam (XANAX) 1 MG tablet Take 1 mg by mouth 3 (three) times daily as needed for anxiety.       . Asenapine Maleate (SAPHRIS) 10 MG SUBL Place 2 tablets under the tongue every evening.       Hinda Kehr 30 MG/ACT SOLN APPLY 2 APPLICATIONS ONTO THE SKIN EVERY MORNING  90 mL  1  . diphenhydrAMINE (BENADRYL) 25 MG tablet Take 25 mg by mouth every 6 (six) hours as needed for allergies.       Marland Kitchen ibuprofen (ADVIL,MOTRIN) 200 MG tablet Take 400 mg by mouth every 6 (six) hours as needed for pain.      Marland Kitchen lamoTRIgine (LAMICTAL) 150 MG tablet Take 300 mg by mouth every morning.       Marland Kitchen LINZESS 145 MCG CAPS capsule TAKE 1 CAPSULE EVERY DAY  30 capsule  0  . methocarbamol (ROBAXIN) 500 MG tablet Take 1 tablet (500 mg total) by mouth 2 (two) times daily.  20 tablet  0  . metoCLOPramide (REGLAN) 5 MG tablet Take 1  tablet (5 mg total) by mouth 4 (four) times daily. Take one pill before meal three times per day. Take 2 pills at bedtime every night.  150 tablet  4  . naproxen (NAPROSYN) 500 MG tablet Take 1 tablet (500 mg total) by mouth 2 (two) times daily.  30 tablet  0  . polyethylene glycol powder (GLYCOLAX/MIRALAX) powder USE 1 CAPFUL (17 GRAMS) EVERY DAY  255 g  3  . prochlorperazine (COMPAZINE) 10 MG tablet TAKE 1 TABLET BY MOUTH EVERY 6 HOURS AS NEEDED  30 tablet  2  . traMADol (ULTRAM) 50 MG tablet TAKE 1 TABLET BY MOUTH EVERY 8 HOURS AS NEEDED  60 tablet  2  . zolpidem (AMBIEN) 10 MG tablet Take 10 mg by mouth at bedtime as needed for sleep.       . [DISCONTINUED] omeprazole (PRILOSEC OTC) 20 MG tablet 1 tablet twice daily. Do not eat for one hour after ingestion of the medication  60 tablet  11   No current facility-administered medications on file prior to visit.    BP 120/80  Pulse 109  Temp(Src) 97.7 F (36.5 C) (Oral)  Resp 20  Ht 5\' 9"  (1.753 m)  Wt 208 lb (94.348 kg)  BMI 30.70 kg/m2   SpO2 96%        Review of Systems  Constitutional: Positive for activity change, appetite change and fatigue. Negative for fever and chills.  HENT: Positive for rhinorrhea and sore throat. Negative for congestion, dental problem, ear pain, hearing loss, tinnitus, trouble swallowing and voice change.   Eyes: Negative for pain, discharge and visual disturbance.  Respiratory: Positive for cough. Negative for chest tightness, wheezing and stridor.   Cardiovascular: Negative for chest pain, palpitations and leg swelling.  Gastrointestinal: Negative for nausea, vomiting, abdominal pain, diarrhea, constipation, blood in stool and abdominal distention.  Genitourinary: Negative for urgency, hematuria, flank pain, discharge, difficulty urinating and genital sores.  Musculoskeletal: Negative for arthralgias, back pain, gait problem, joint swelling, myalgias and neck stiffness.  Skin: Negative for rash.  Neurological: Negative for dizziness, syncope, speech difficulty, weakness, numbness and headaches.  Hematological: Negative for adenopathy. Does not bruise/bleed easily.  Psychiatric/Behavioral: Negative for behavioral problems and dysphoric mood. The patient is not nervous/anxious.        Objective:   Physical Exam  Constitutional: He is oriented to person, place, and time. He appears well-developed.  HENT:  Head: Normocephalic.  Right Ear: External ear normal.  Left Ear: External ear normal.  Erythema of the oropharynx without exudate or cervical adenopathy  Eyes: Conjunctivae and EOM are normal.  Neck: Normal range of motion.  Cardiovascular: Normal rate and normal heart sounds.   Pulmonary/Chest: Breath sounds normal.  Abdominal: Bowel sounds are normal.  Musculoskeletal: Normal range of motion. He exhibits no edema and no tenderness.  Lymphadenopathy:    He has no cervical adenopathy.  Neurological: He is alert and oriented to person, place, and time.  Psychiatric: He has a normal  mood and affect. His behavior is normal.          Assessment & Plan:  Viral pharyngitis.  We'll treat symptomatically with loss measures, naproxen, and tramadol GERD.  Omeprazole refilled Gastroparesis.  Follow GI

## 2013-10-20 NOTE — Progress Notes (Signed)
Pre-visit discussion using our clinic review tool. No additional management support is needed unless otherwise documented below in the visit note.  

## 2013-10-21 ENCOUNTER — Telehealth: Payer: Self-pay | Admitting: Internal Medicine

## 2013-10-21 NOTE — Telephone Encounter (Signed)
Relevant patient education assigned to patient using Emmi. ° °

## 2013-10-25 ENCOUNTER — Ambulatory Visit: Payer: 59 | Admitting: Internal Medicine

## 2013-10-31 ENCOUNTER — Other Ambulatory Visit: Payer: Self-pay | Admitting: Gastroenterology

## 2013-10-31 ENCOUNTER — Other Ambulatory Visit: Payer: Self-pay | Admitting: Internal Medicine

## 2013-11-07 ENCOUNTER — Telehealth: Payer: Self-pay | Admitting: Internal Medicine

## 2013-11-07 DIAGNOSIS — M549 Dorsalgia, unspecified: Secondary | ICD-10-CM

## 2013-11-07 NOTE — Telephone Encounter (Signed)
Pt still having back pain. Epidurals aren't working.  Ortho doc told pt it may be good to see neuro. Pt would like referral to a neuro. Pt aware dr Raliegh Ip  Out until 3/17

## 2013-11-14 NOTE — Telephone Encounter (Signed)
Left detailed message referral for Neurology done and someone will be in contact with you for an appointment.

## 2013-11-14 NOTE — Telephone Encounter (Signed)
Please see message and advise if okay for referral to Neurology?

## 2013-11-14 NOTE — Telephone Encounter (Signed)
Ok to refer.

## 2013-11-24 ENCOUNTER — Ambulatory Visit (INDEPENDENT_AMBULATORY_CARE_PROVIDER_SITE_OTHER): Payer: 59 | Admitting: Neurology

## 2013-11-24 ENCOUNTER — Encounter: Payer: Self-pay | Admitting: Neurology

## 2013-11-24 VITALS — BP 98/60 | HR 68 | Resp 18 | Ht 69.0 in | Wt 203.3 lb

## 2013-11-24 DIAGNOSIS — M5410 Radiculopathy, site unspecified: Secondary | ICD-10-CM

## 2013-11-24 DIAGNOSIS — IMO0002 Reserved for concepts with insufficient information to code with codable children: Secondary | ICD-10-CM

## 2013-11-24 DIAGNOSIS — G609 Hereditary and idiopathic neuropathy, unspecified: Secondary | ICD-10-CM | POA: Insufficient documentation

## 2013-11-24 MED ORDER — GABAPENTIN 300 MG PO CAPS: ORAL_CAPSULE | ORAL | Status: DC

## 2013-11-24 NOTE — Patient Instructions (Signed)
1. Schedule EMG/NCV of both LE 2. Bloodwork for CMP, TSH, B12, ESR, ANA, SPEP/IFE 3. Start Gabapentin 36m: Take 1 cap at bedtime for 1 week, then increase to 1 cap twice a day for 1 week, then increase to 1 cap in AM, 2 caps in PM 4. Follow-up in 6 weeks

## 2013-11-24 NOTE — Progress Notes (Signed)
NEUROLOGY CONSULTATION NOTE  KERI TAVELLA MRN: [id number] DOB: September 22, 1970  Referring provider: Dr. Bluford Kaufmann Primary care provider: Dr. Bluford Kaufmann  Reason for consult:  Back pain  Dear Dr Burnice Logan:  Thank you for your kind referral of Sean Wu for consultation of the above symptoms. Although his history is well known to you, please allow me to reiterate it for the purpose of our medical record. Records and images were personally reviewed where available. Records from Ortho unavailable for review.   HISTORY OF PRESENT ILLNESS: This is a 43 year old right-handed man with a history of depression, gastroparesis, presenting for continued back pain despite epidural injections.  He was new to work lifting several boxes in October 2014, when he started having significant left lower back pain with paresthesias down the left lower leg.  He has had 2 epidural injections with no significant relief.  He reports pins and needles from below the left knee down to the foot.  He has a dull pain in his left lower back that is tender "excruciating" when palpated.  Recently he feels that both legs have become weaker.  No bowel or bladder involvement. No sensory symptoms in the right leg.  Arms are unaffected.  He has fallen once since, but denies any falls or accidents prior to the back pain.  He has had intermittent sharp and pressure-like frontal headaches since Aug/Sept 2014 with no associated nausea/vomiting/photo or phonophobia.  He has gastroparesis and is chronically constipated, except when he takes the [place].  He denies any diplopia, dysarthria, dysphagia, neck pain, chest pain, shortness of breath.    He has tried Naproxen, Tramadol, Robaxin with minimal relief.  He takes Trazodone to sleep, this makes his mouth feel dry that he has to enunciate words slowly.  He has been taking Lamictal and Saphris for many years.  He has not been back to work due to the pain.    I  personally reviewed MRI lumbar spine without contrast which was unremarkable, there was note of mild disk bulge at L2-3 and there is a shallow central protrusion at L5-S1 without central canal or foraminal narrowing.  PAST MEDICAL HISTORY: Past Medical History  Diagnosis Date  . TESTICULAR HYPOFUNCTION 06/12/2009  . SMOKER 06/12/2009  . DEPRESSION 05/10/2009  . SLEEP APNEA, OBSTRUCTIVE 06/12/2009  . ALLERGIC RHINITIS 05/10/2009  . GERD 05/10/2009  . WEIGHT GAIN 05/10/2009  . LIBIDO, DECREASED 05/10/2009  . Osgood-Schlatter's disease   . Anxiety   . Chronic headaches   . Gastroparesis   . DDD (degenerative disc disease)     PAST SURGICAL HISTORY: Past Surgical History  Procedure Laterality Date  . Lasik Bilateral   . Ankle surgery Left   . Colonoscopy  2014    normal    MEDICATIONS: Current Outpatient Prescriptions on File Prior to Visit  Medication Sig Dispense Refill  . ALPRAZolam (XANAX) 1 MG tablet Take 1 mg by mouth 3 (three) times daily as needed for anxiety.       . Asenapine Maleate (SAPHRIS) 10 MG SUBL Place 2 tablets under the tongue every evening.       . lamoTRIgine (LAMICTAL) 150 MG tablet Take 300 mg by mouth every morning.       . [DISCONTINUED] omeprazole (PRILOSEC OTC) 20 MG tablet 1 tablet twice daily. Do not eat for one hour after ingestion of the medication  60 tablet  11   No current facility-administered medications on file prior to visit.  ALLERGIES: No Known Allergies  FAMILY HISTORY: Family History  Problem Relation Age of Onset  . Heart disease Maternal Grandfather   . Irritable bowel syndrome Father   . Alcoholism Father   . Heart attack Father   . Colon cancer Neg Hx   . Rectal cancer Neg Hx   . Stomach cancer Neg Hx     SOCIAL HISTORY: History   Social History  . Marital Status: Married    Spouse Name: N/A    Number of Children: 0  . Years of Education: N/A   Occupational History  . sales    Social History Main Topics  . Smoking  status: Current Every Day Smoker -- 0.50 packs/day for 15 years    Types: Cigarettes  . Smokeless tobacco: Former Systems developer    Types: [place] date: 09/01/1998     Comment: tobacco info given 08/01/13  . Alcohol Use: Yes     Comment: social  . Drug Use: No  . Sexual Activity: Not on file   Other Topics Concern  . Not on file   Social History Narrative  . No narrative on file    REVIEW OF SYSTEMS: Constitutional: No fevers, chills, or sweats, + generalized fatigue, no change in appetite Eyes: No visual changes, double vision, eye pain Ear, nose and throat: No hearing loss, ear pain, nasal congestion, sore throat Cardiovascular: No chest pain, palpitations Respiratory:  No shortness of breath at rest or with exertion, wheezes GastrointestinaI: No nausea, vomiting, +diarrhea, no abdominal pain, fecal incontinence Genitourinary:  No dysuria, urinary retention or frequency Musculoskeletal:  No neck pain, + back pain Integumentary: No rash, pruritus, skin lesions Neurological: as above Psychiatric: + depression, insomnia, anxiety Endocrine: No palpitations, fatigue, diaphoresis, mood swings, change in appetite, change in weight, increased thirst Hematologic/Lymphatic:  No anemia, purpura, petechiae. Allergic/Immunologic: no itchy/runny eyes, nasal congestion, recent allergic reactions, rashes  PHYSICAL EXAM: Filed Vitals:   11/24/13 0741  BP: 98/60  Pulse: 68  Resp: 18   General: No acute distress Head:  Normocephalic/atraumatic Neck: supple, no paraspinal tenderness, full range of motion Back: + left paraspinal tenderness Heart: regular rate and rhythm Lungs: Clear to auscultation bilaterally. Vascular: No carotid bruits. Skin/Extremities: No rash, no edema Neurological Exam: Mental status: alert and oriented to person, place, and time, no dysarthria or aphasia, Fund of knowledge is appropriate.  Recent and remote memory are intact.  Attention and concentration are normal.     Able to name objects and repeat phrases. Cranial nerves: CN I: not tested CN II: pupils equal, round and reactive to light, visual fields intact, fundi unremarkable. CN III, IV, VI:  full range of motion, no nystagmus, no ptosis CN V: facial sensation intact CN VII: upper and lower face symmetric CN VIII: hearing intact CN IX, X: gag intact, uvula midline CN XI: sternocleidomastoid and trapezius muscles intact CN XII: tongue midline Bulk & Tone: normal, no fasciculations. Motor: 5/5 throughout with no pronator drift. Sensation: decreased cold and pin in a stocking distribution up to both knees, reporting even decreased sensation over L4 distribution bilaterally. Intact vibration and joint position sense.  Romberg test slight sway Deep Tendon Reflexes: +2 throughout except for absent ankle jerks bilaterally, no ankle clonus Plantar responses: downgoing bilaterally Cerebellar: no incoordination on finger to nose, heel to shin. No dysdiadochokinesia Gait: narrow-based and steady, able to tandem walk adequately. Tremor: none  IMPRESSION: This is a 43 year old right-handed man with a history of depression  and gastroparesis presenting for chronic back pain after lifting several boxes at work in October 2014.  There is associated paresthesias in the L4-5 distribution, exam shows decreased sensation in both lower extremities in a stocking distribution and absent ankle reflexes.  His MRI lumbar spine does not show significant stenosis, however symptoms are suggestive of possible L4/5 radiculopathy, as well as evidence of a length-dependent neuropathy.  Neuropathy labs will be ordered and an EMG/NCV will be performed for further evaluation.  We discussed the use of membrane stabilizing agents such as Neurontin, he will start at low dose 300mg  qhs then slowly uptitrate to 900mg /day.  Side effects were discussed.  He would benefit from physical therapy.  He will follow-up in 6 weeks.    Thank you for  allowing me to participate in the care of this patient. Please do not hesitate to call for any questions or concerns.   Ellouise Newer, M.D.

## 2013-11-30 ENCOUNTER — Other Ambulatory Visit: Payer: Self-pay | Admitting: Gastroenterology

## 2013-12-23 ENCOUNTER — Other Ambulatory Visit: Payer: Self-pay | Admitting: Gastroenterology

## 2013-12-23 ENCOUNTER — Telehealth: Payer: Self-pay | Admitting: Neurology

## 2013-12-23 NOTE — Telephone Encounter (Signed)
Pt called stating that the Gabapentin 300mg  is not helping him sleep and would like to know If there is anything he can take. Please call pt.

## 2013-12-23 NOTE — Telephone Encounter (Signed)
He can increase dose of gabapentin to 2 caps at night. But please clarify with patient that this medication is for nerve pain, although it's side effect is drowsiness, I would recommend he speak to his PCP regarding the sleep.  Thanks

## 2013-12-23 NOTE — Telephone Encounter (Signed)
Please advise 

## 2013-12-25 ENCOUNTER — Other Ambulatory Visit: Payer: Self-pay | Admitting: Internal Medicine

## 2013-12-27 ENCOUNTER — Other Ambulatory Visit: Payer: Self-pay | Admitting: Gastroenterology

## 2013-12-28 NOTE — Telephone Encounter (Signed)
Pt has returned called to [place]. Please call [phone] / Sherri S.

## 2013-12-28 NOTE — Telephone Encounter (Signed)
Patient notified

## 2013-12-28 NOTE — Telephone Encounter (Signed)
Unable to reach patien will try again later

## 2013-12-30 ENCOUNTER — Other Ambulatory Visit: Payer: Self-pay | Admitting: *Deleted

## 2013-12-30 DIAGNOSIS — R202 Paresthesia of skin: Secondary | ICD-10-CM

## 2014-01-02 ENCOUNTER — Encounter: Payer: 59 | Admitting: Neurology

## 2014-01-02 ENCOUNTER — Other Ambulatory Visit: Payer: Self-pay | Admitting: Internal Medicine

## 2014-01-02 NOTE — Telephone Encounter (Signed)
Please advise if okay to fill  

## 2014-01-02 NOTE — Telephone Encounter (Signed)
ok 

## 2014-01-03 NOTE — Telephone Encounter (Signed)
Rx for Axiron phoned into pharmacy.

## 2014-01-04 ENCOUNTER — Telehealth: Payer: Self-pay | Admitting: Neurology

## 2014-01-04 ENCOUNTER — Encounter: Payer: Self-pay | Admitting: Neurology

## 2014-01-04 NOTE — Telephone Encounter (Signed)
Pt no showed 01/02/14 EMG w/ Dr. Posey Pronto. No show letter mailed to pt / Sherri S.

## 2014-01-09 ENCOUNTER — Ambulatory Visit (INDEPENDENT_AMBULATORY_CARE_PROVIDER_SITE_OTHER): Payer: 59 | Admitting: Internal Medicine

## 2014-01-09 ENCOUNTER — Encounter: Payer: Self-pay | Admitting: Internal Medicine

## 2014-01-09 VITALS — BP 138/90 | HR 97 | Temp 98.6°F | Resp 20 | Ht 69.0 in | Wt 201.0 lb

## 2014-01-09 DIAGNOSIS — F329 Major depressive disorder, single episode, unspecified: Secondary | ICD-10-CM

## 2014-01-09 DIAGNOSIS — G43909 Migraine, unspecified, not intractable, without status migrainosus: Secondary | ICD-10-CM

## 2014-01-09 DIAGNOSIS — F3289 Other specified depressive episodes: Secondary | ICD-10-CM

## 2014-01-09 MED ORDER — SUMATRIPTAN SUCCINATE 100 MG PO TABS: ORAL_TABLET | ORAL | Status: DC

## 2014-01-09 MED ORDER — TRAMADOL HCL 50 MG PO TABS: 50.0000 mg | ORAL_TABLET | Freq: Four times a day (QID) | ORAL | Status: DC | PRN

## 2014-01-09 NOTE — Progress Notes (Signed)
Subjective:    Patient ID: Sean Wu, male    DOB: 21-Apr-1971, 43 y.o.   MRN: [id number]  HPI  43 year old patient who has a history depression, who is seen today with a chief complaint of headache.  He states that he has a long history of migraine headaches that began while he was in high school.  These resolved, but were problematic for several years in his thirties.  He was awakened earlier today with severe, throbbing, frontal headaches.  Associated symptoms include mild nausea and photophobia.  He states that this is his classic migraine headache.  No vomiting.  He does have some chronic constipation issues  Past Medical History  Diagnosis Date  . TESTICULAR HYPOFUNCTION 06/12/2009  . SMOKER 06/12/2009  . DEPRESSION 05/10/2009  . SLEEP APNEA, OBSTRUCTIVE 06/12/2009  . ALLERGIC RHINITIS 05/10/2009  . GERD 05/10/2009  . WEIGHT GAIN 05/10/2009  . LIBIDO, DECREASED 05/10/2009  . Osgood-Schlatter's disease   . Anxiety   . Chronic headaches   . Gastroparesis   . DDD (degenerative disc disease)     History   Social History  . Marital Status: Married    Spouse Name: N/A    Number of Children: 0  . Years of Education: N/A   Occupational History  . sales    Social History Main Topics  . Smoking status: Current Every Day Smoker -- 0.50 packs/day for 15 years    Types: Cigarettes  . Smokeless tobacco: Former Systems developer    Types: [place] date: 09/01/1998     Comment: tobacco info given 08/01/13  . Alcohol Use: Yes     Comment: social  . Drug Use: No  . Sexual Activity: Not on file   Other Topics Concern  . Not on file   Social History Narrative  . No narrative on file    Past Surgical History  Procedure Laterality Date  . Lasik Bilateral   . Ankle surgery Left   . Colonoscopy  2014    normal    Family History  Problem Relation Age of Onset  . Heart disease Maternal Grandfather   . Irritable bowel syndrome Father   . Alcoholism Father   . Heart attack Father   .  Colon cancer Neg Hx   . Rectal cancer Neg Hx   . Stomach cancer Neg Hx     No Known Allergies  Current Outpatient Prescriptions on File Prior to Visit  Medication Sig Dispense Refill  . ALPRAZolam (XANAX) 1 MG tablet Take 1 mg by mouth 3 (three) times daily as needed for anxiety.       . Asenapine Maleate (SAPHRIS) 10 MG SUBL Place 2 tablets under the tongue every evening.       Hinda Kehr 30 MG/ACT SOLN APPLY 2 ACTUATIONS AND APPLY EVERY MORNING  90 mL  1  . gabapentin (NEURONTIN) 300 MG capsule Take 1 cap in AM, 2 caps in PM  90 capsule  6  . lamoTRIgine (LAMICTAL) 150 MG tablet Take 300 mg by mouth every morning.       Marland Kitchen LINZESS 145 MCG CAPS capsule TAKE 1 CAPSULE EVERY DAY  30 capsule  0  . naproxen (NAPROSYN) 250 MG tablet Take 250 mg by mouth 3 (three) times daily with meals.      . methocarbamol (ROBAXIN) 500 MG tablet Take 500 mg by mouth 3 (three) times daily.      . traZODone (DESYREL) 50 MG tablet Take 100 mg  by mouth at bedtime.      . [DISCONTINUED] omeprazole (PRILOSEC OTC) 20 MG tablet 1 tablet twice daily. Do not eat for one hour after ingestion of the medication  60 tablet  11   No current facility-administered medications on file prior to visit.    BP 138/90  Pulse 97  Temp(Src) 98.6 F (37 C) (Oral)  Resp 20  Ht 5\' 9"  (1.753 m)  Wt 201 lb (91.173 kg)  BMI 29.67 kg/m2  SpO2 98%        Review of Systems  Constitutional: Negative for fever, chills, appetite change and fatigue.  HENT: Negative for congestion, dental problem, ear pain, hearing loss, sore throat, tinnitus, trouble swallowing and voice change.   Eyes: Negative for pain, discharge and visual disturbance.  Respiratory: Negative for cough, chest tightness, wheezing and stridor.   Cardiovascular: Negative for chest pain, palpitations and leg swelling.  Gastrointestinal: Positive for nausea. Negative for vomiting, abdominal pain, diarrhea, constipation, blood in stool and abdominal distention.    Genitourinary: Negative for urgency, hematuria, flank pain, discharge, difficulty urinating and genital sores.  Musculoskeletal: Negative for arthralgias, back pain, gait problem, joint swelling, myalgias and neck stiffness.  Skin: Negative for rash.  Neurological: Positive for headaches. Negative for dizziness, syncope, speech difficulty, weakness and numbness.  Hematological: Negative for adenopathy. Does not bruise/bleed easily.  Psychiatric/Behavioral: Negative for behavioral problems and dysphoric mood. The patient is not nervous/anxious.        Objective:   Physical Exam  Constitutional: He is oriented to person, place, and time. He appears well-developed.  HENT:  Head: Normocephalic.  Right Ear: External ear normal.  Left Ear: External ear normal.  Eyes: Conjunctivae and EOM are normal. Pupils are equal, round, and reactive to light.  Pupils dilated, and briskly reactive and equal  Neck: Normal range of motion.  Cardiovascular: Normal rate and normal heart sounds.   Pulmonary/Chest: Breath sounds normal.  Abdominal: Bowel sounds are normal.  Musculoskeletal: Normal range of motion. He exhibits no edema and no tenderness.  Neurological: He is alert and oriented to person, place, and time. No cranial nerve deficit.  Psychiatric: He has a normal mood and affect. His behavior is normal.          Assessment & Plan:   Migraine headache.  His tramadol refilled.  Also, given a prescription for Imitrex History depression

## 2014-01-09 NOTE — Patient Instructions (Signed)
Call or return to clinic prn if these symptoms worsen or fail to improve as anticipated.  Migraine Headache A migraine headache is an intense, throbbing pain on one or both sides of your head. A migraine can last for 30 minutes to several hours. CAUSES  The exact cause of a migraine headache is not always known. However, a migraine may be caused when nerves in the brain become irritated and release chemicals that cause inflammation. This causes pain. Certain things may also trigger migraines, such as:  Alcohol.  Smoking.  Stress.  Menstruation.  Aged cheeses.  Foods or drinks that contain nitrates, glutamate, aspartame, or tyramine.  Lack of sleep.  Chocolate.  Caffeine.  Hunger.  Physical exertion.  Fatigue.  Medicines used to treat chest pain (nitroglycerine), birth control pills, estrogen, and some blood pressure medicines. SIGNS AND SYMPTOMS  Pain on one or both sides of your head.  Pulsating or throbbing pain.  Severe pain that prevents daily activities.  Pain that is aggravated by any physical activity.  Nausea, vomiting, or both.  Dizziness.  Pain with exposure to bright lights, loud noises, or activity.  General sensitivity to bright lights, loud noises, or smells. Before you get a migraine, you may get warning signs that a migraine is coming (aura). An aura may include:  Seeing flashing lights.  Seeing bright spots, halos, or zig-zag lines.  Having tunnel vision or blurred vision.  Having feelings of numbness or tingling.  Having trouble talking.  Having muscle weakness. DIAGNOSIS  A migraine headache is often diagnosed based on:  Symptoms.  Physical exam.  A CT scan or MRI of your head. These imaging tests cannot diagnose migraines, but they can help rule out other causes of headaches. TREATMENT Medicines may be given for pain and nausea. Medicines can also be given to help prevent recurrent migraines.  HOME CARE INSTRUCTIONS  Only  take over-the-counter or prescription medicines for pain or discomfort as directed by your health care provider. The use of long-term narcotics is not recommended.  Lie down in a dark, quiet room when you have a migraine.  Keep a journal to find out what may trigger your migraine headaches. For example, write down:  What you eat and drink.  How much sleep you get.  Any change to your diet or medicines.  Limit alcohol consumption.  Quit smoking if you smoke.  Get 7 9 hours of sleep, or as recommended by your health care provider.  Limit stress.  Keep lights dim if bright lights bother you and make your migraines worse. SEEK IMMEDIATE MEDICAL CARE IF:   Your migraine becomes severe.  You have a fever.  You have a stiff neck.  You have vision loss.  You have muscular weakness or loss of muscle control.  You start losing your balance or have trouble walking.  You feel faint or pass out.  You have severe symptoms that are different from your first symptoms. MAKE SURE YOU:   Understand these instructions.  Will watch your condition.  Will get help right away if you are not doing well or get worse. Document Released: 08/18/2005 Document Revised: 06/08/2013 Document Reviewed: 04/25/2013 [facility] Patient Information 2014 [employer].

## 2014-01-09 NOTE — Progress Notes (Signed)
Pre-visit discussion using our clinic review tool. No additional management support is needed unless otherwise documented below in the visit note.  

## 2014-01-26 ENCOUNTER — Other Ambulatory Visit: Payer: Self-pay | Admitting: Gastroenterology

## 2014-02-06 ENCOUNTER — Telehealth: Payer: Self-pay | Admitting: Neurology

## 2014-02-06 ENCOUNTER — Ambulatory Visit (INDEPENDENT_AMBULATORY_CARE_PROVIDER_SITE_OTHER): Payer: 59 | Admitting: Neurology

## 2014-02-06 DIAGNOSIS — R209 Unspecified disturbances of skin sensation: Secondary | ICD-10-CM

## 2014-02-06 DIAGNOSIS — R202 Paresthesia of skin: Secondary | ICD-10-CM

## 2014-02-06 NOTE — Telephone Encounter (Signed)
Stopped Robaxin 500mg  TID 4-5 days ago, woke up with whole body shaking feeling like withdrawal. Instructed to take BID x 1 week, then once a day for one week, then stop. Discussed normal EMG/NCV, no evidence of radiculopathy.  He got a shot in the nerve 81month ago which helped with pain, he does feel it wearing off but feels the gabapentin 300mg  is working, taking 1 in AM, 2 in PM. No side effects. He will be referred to Sports medicine for musculoskeletal pain.

## 2014-02-06 NOTE — Procedures (Signed)
[facility]  [place], [address]  [place], [place] [address] Tel: [phone] Fax:  [phone] Test Date:  02/06/2014  Patient: Sean Wu DOB: 04/22/1971 Physician: Narda Amber, DO  Sex: Male Height: 5\' 9"  Ref Phys: Ellouise Newer  ID#: [id number] Temp: 35.0C Technician: Laureen Ochs R. NCS T.   Patient Complaints: Patient is a 43 year old male with L>R low back pain and shooting pain to buttocks and hip for past year.  NCV & EMG Findings: Extensive electrodiagnostic testing of the left lower extremity and additional studies of the right reveals:  1. Normal sural and superficial peroneal sensory responses bilaterally.  2. Normal peroneal and tibial motor responses bilaterally.  3. There is no evidence of active or chronic motor axonal loss changes affecting the tested muscles.    Impression: This is a normal study of the left lower extremity. In particular there is no evidence of a generalized sensorimotor polyneuropathy or lumbosacral radiculopathy affecting the left side.   ___________________________ Narda Amber, DO    Nerve Conduction Studies Anti Sensory Summary Table   Site NR Peak (ms) Norm Peak (ms) P-T Amp (V) Norm P-T Amp  Left Sup Peroneal Anti Sensory (Ant Lat Mall)  12 cm    3.1 <4.5 8.2 >5  Right Sup Peroneal Anti Sensory (Ant Lat Mall)  12 cm    3.0 <4.5 8.5 >5  Left Sural Anti Sensory (Lat Mall)  Calf    3.3 <4.5 9.0 >5  Right Sural Anti Sensory (Lat Mall)  Calf    3.3 <4.5 8.8 >5   Motor Summary Table   Site NR Onset (ms) Norm Onset (ms) O-P Amp (mV) Norm O-P Amp Site1 Site2 Delta-0 (ms) Dist (cm) Vel (m/s) Norm Vel (m/s)  Left Peroneal Motor (Ext Dig Brev)  Ankle    3.2 <5.5 5.6 >3 B Fib Ankle 5.9 30.0 51 >40  B Fib    9.1  5.6  Poplt B Fib 2.0 10.0 50 >40  Poplt    11.1  5.1         Right Peroneal Motor (Ext Dig Brev)  Ankle    3.9 <5.5 4.8 >3 B Fib Ankle 6.5 32.0 49 >40  B Fib    10.4  4.6  Poplt B Fib 2.2 10.0 45 >40    Poplt    12.6  4.4         Left Peroneal TA Motor (Tib Ant)  Fib Head    1.6 <4.0 6.2 >4 Poplit Fib Head 2.3 10.0 43 >40  Poplit    3.9  5.9         Left Tibial Motor (Abd Hall Brev)  Ankle    3.0 <6.0 12.1 >8 Knee Ankle 7.2 47.0 65 >40  Knee    10.2  9.2         Right Tibial Motor (Abd Hall Brev)  Ankle    3.1 <6.0 8.2 >8 Knee Ankle 7.8 42.0 54 >40  Knee    10.9  5.8          F Wave Studies   NR F-Lat (ms) Lat Norm (ms) L-R F-Lat (ms)  Left Tibial (Mrkrs) (Abd Hallucis)     47.06 <55 0.56  Right Tibial (Mrkrs) (Abd Hallucis)     47.62 <55 0.56   EMG   Side Muscle Ins Act Fibs Psw Fasc Number Recrt Dur Dur. Amp Amp. Poly Poly. Comment  Left AntTibialis Nml Nml Nml Nml Nml Nml Nml  Nml Nml Nml Nml Nml N/A  Left Gastroc Nml Nml Nml Nml Nml Nml Nml Nml Nml Nml Nml Nml N/A  Left Flex Dig Long Nml Nml Nml Nml Nml Nml Nml Nml Nml Nml Nml Nml N/A  Left RectFemoris Nml Nml Nml Nml Nml Nml Nml Nml Nml Nml Nml Nml N/A  Left GluteusMed Nml Nml Nml Nml Nml Nml Nml Nml Nml Nml Nml Nml N/A  Left BicepsFemS Nml Nml Nml Nml Nml Nml Nml Nml Nml Nml Nml Nml N/A  Left Lumbo Parasp Low Nml Nml Nml Nml Nml Nml Nml Nml Nml Nml Nml Nml N/A      Waveforms:

## 2014-02-10 ENCOUNTER — Telehealth: Payer: Self-pay | Admitting: *Deleted

## 2014-02-10 DIAGNOSIS — M5410 Radiculopathy, site unspecified: Secondary | ICD-10-CM

## 2014-02-10 DIAGNOSIS — G609 Hereditary and idiopathic neuropathy, unspecified: Secondary | ICD-10-CM

## 2014-02-10 DIAGNOSIS — M549 Dorsalgia, unspecified: Secondary | ICD-10-CM

## 2014-02-10 NOTE — Telephone Encounter (Signed)
Patient notified of time and date

## 2014-02-14 ENCOUNTER — Ambulatory Visit (INDEPENDENT_AMBULATORY_CARE_PROVIDER_SITE_OTHER): Payer: 59 | Admitting: Family Medicine

## 2014-02-14 ENCOUNTER — Other Ambulatory Visit (INDEPENDENT_AMBULATORY_CARE_PROVIDER_SITE_OTHER): Payer: 59

## 2014-02-14 ENCOUNTER — Encounter: Payer: Self-pay | Admitting: Family Medicine

## 2014-02-14 VITALS — BP 122/84 | HR 107 | Ht 69.0 in | Wt 205.0 lb

## 2014-02-14 DIAGNOSIS — M76899 Other specified enthesopathies of unspecified lower limb, excluding foot: Secondary | ICD-10-CM

## 2014-02-14 DIAGNOSIS — M25559 Pain in unspecified hip: Secondary | ICD-10-CM

## 2014-02-14 DIAGNOSIS — M7062 Trochanteric bursitis, left hip: Secondary | ICD-10-CM | POA: Insufficient documentation

## 2014-02-14 DIAGNOSIS — M25552 Pain in left hip: Secondary | ICD-10-CM

## 2014-02-14 DIAGNOSIS — M5416 Radiculopathy, lumbar region: Secondary | ICD-10-CM | POA: Insufficient documentation

## 2014-02-14 DIAGNOSIS — IMO0002 Reserved for concepts with insufficient information to code with codable children: Secondary | ICD-10-CM

## 2014-02-14 NOTE — Assessment & Plan Note (Signed)
Patient was given injection described above. Patient tolerated the procedure very well. Patient did have good resolution of pain. Patient is given home exercise program 3 times a day we discussed icing and over-the-counter medications he can be beneficial. I do believe the patient is on too many pain medications at this time and patient does have severe dilation of his pupils today. Patient does seem agitated that the amount of medications and I would like to decrease the amount in the long run. Patient does have some mild back bulging disc that could also be contributing to his pain. Patient has had epidural injections but has not made any significant improvement except for the third injection. If he continues to have trouble we may need to consider surgical intervention but I still think there is a lot of conservative option still available.

## 2014-02-14 NOTE — Progress Notes (Signed)
[facility] [address] [place], [place] [address] Phone: [phone] Subjective:    I'm seeing this patient by the request  of:  Nyoka Cowden, MD   CC: Back pain  BDZ:[id number] Sean Wu is a 43 y.o. male coming in with complaint of low back pain. Patient has had this pain since October. Patient states that he was working at [employer] for one day and the next day he was unable to move. Patient did have radiculopathy down the left leg previously. Patient did have a fairly significant workup including x-rays back in October of 2014 that were reviewed by me initially mild degenerative changes. Patient also had an MRI June 23, 2013. Patient only had a shallow central disc protrusion at L5-S1 But no significant central canal or foraminal narrowing noted. Patient was then seen by multiple specialists and did have a total of 3 epidural steroid injections and one foraminal injection it sounds like. Patient's foraminal injection did give him some mild benefit for approximately a week or 2. Patient then was seen by neurology for the continued radiculopathy without any significant pathology. Patient had an EMG which was unremarkable for any radicular symptoms. Patient states that this has affected multiple facets of his life. Patient is unable to work secondary to pain he has also become somewhat depressed. Patient has been on the different medications including oral narcotics, formal physical therapy, and as of now is taking Xanax, gabapentin, Lamictal, Robaxin, Phenergan, Seroquel, and tramadol for pain he states. Patient states that he does not take this medication he has no significant improvement.    Past medical history, social, surgical and family history all reviewed in electronic medical record.   Review of Systems: No headache, visual changes, nausea, vomiting, diarrhea, constipation, dizziness, abdominal pain, skin rash, fevers, chills, night sweats,  weight loss, swollen lymph nodes, body aches, joint swelling, muscle aches, chest pain, shortness of breath, mood changes.   Objective Blood pressure 122/84, pulse 107, height 5\' 9"  (1.753 m), weight 205 lb (92.987 kg), SpO2 93.00%.  General: No apparent distress alert and oriented x3 mood and affect normal, dressed appropriately.  HEENT: Pupils equal, extraocular movements intact  Respiratory: Patient's speak in full sentences and does not appear short of breath  Cardiovascular: No lower extremity edema, non tender, no erythema  Skin: Warm dry intact with no signs of infection or rash on extremities or on axial skeleton.  Abdomen: Soft nontender  Neuro: Cranial nerves II through XII are intact, neurovascularly intact in all extremities with 2+ DTRs and 2+ pulses.  Lymph: No lymphadenopathy of posterior or anterior cervical chain or axillae bilaterally.  Gait normal with good balance and coordination.  MSK:  Non tender with full range of motion and good stability and symmetric strength and tone of shoulders, elbows, wrist, hip, knee and ankles bilaterally.  Back Exam:  Inspection: Unremarkable  Motion: Flexion 35 deg, Extension 25 deg, Side Bending to 35 deg bilaterally,  Rotation to 40 deg bilaterally  SLR laying: Negative  XSLR laying: Negative  Palpable tenderness: Patient is severely tender over the greater trochanter area on the left side. Mild tenderness over the left SI joint. FABER: negative. Sensory change: Gross sensation intact to all lumbar and sacral dermatomes.  Reflexes: 2+ at both patellar tendons, 2+ at achilles tendons, Babinski's downgoing.  Strength at foot  Plantar-flexion: 5/5 Dorsi-flexion: 5/5 Eversion: 5/5 Inversion: 5/5  Leg strength  Quad: 5/5 Hamstring: 5/5 Hip flexor: 5/5 Hip abductors: 5/5  Gait unremarkable.   Procedure: Real-time Ultrasound Guided Injection of left  greater trochanteric bursitis secondary to patient's body habitus Device: GE Logiq E    Ultrasound guided injection is preferred based studies that show increased duration, increased effect, greater accuracy, decreased procedural pain, increased response rate, and decreased cost with ultrasound guided versus blind injection.  Verbal informed consent obtained.  Time-out conducted.  Noted no overlying erythema, induration, or other signs of local infection.  Skin prepped in a sterile fashion.  Local anesthesia: Topical Ethyl chloride.  With sterile technique and under real time ultrasound guidance:  Greater trochanteric area was visualized and patient's bursa was noted. A 22-gauge 3 inch needle was inserted and 4 cc of 0.5% Marcaine and 1 cc of Kenalog 40 mg/dL was injected. Pictures taken Completed without difficulty  Pain immediately resolved suggesting accurate placement of the medication.  Advised to call if fevers/chills, erythema, induration, drainage, or persistent bleeding.  Images permanently stored and available for review in the ultrasound unit.  Impression: Technically successful ultrasound guided injection.    Impression and Recommendations:     This case required medical decision making of moderate complexity.

## 2014-02-14 NOTE — Assessment & Plan Note (Addendum)
Patient does have lumbar radiculopathy with mild bulging at L4-L5 and L5-S1 on the left side that could be contributing to his pain. Patient has tried epidural steroid injections, formal physical therapy, home exercises, multiple different pain medications with no significant permanent. Patient does not like the side effects of the Robaxin and reduce the medication we'll discontinue the long run. We discussed about over-the-counter medications a can be beneficial. My goal would to get patient off the medications when appropriate. Patient was given home exercise program and will continue to monitor. Patient is to make any significant improvement unfortunately surgery intervention may be his only option. Do not believe the patient would respond very well to osteopathic manipulation we will give him the possibility.

## 2014-02-14 NOTE — Patient Instructions (Signed)
Good to meet you Exercises for hip ad back 3 times a week.  Ice is better then heat I would do this 20 minutes 2 times daily.  Take tylenol 650 mg three times a day is the best evidence based medicine we have for arthritis.  Glucosamine sulfate 750mg  twice a day is a supplement that has been shown to help moderate to severe arthritis. Vitamin D 2000 IU daily Fish oil 2 grams daily.  Tumeric 500mg  twice daily.  Capsaicin topically up to four times a day may also help with pain. Come back in 3 weeks.

## 2014-02-19 ENCOUNTER — Other Ambulatory Visit: Payer: Self-pay | Admitting: Gastroenterology

## 2014-02-24 ENCOUNTER — Ambulatory Visit (INDEPENDENT_AMBULATORY_CARE_PROVIDER_SITE_OTHER): Payer: 59 | Admitting: Internal Medicine

## 2014-02-24 ENCOUNTER — Encounter: Payer: Self-pay | Admitting: Internal Medicine

## 2014-02-24 VITALS — BP 110/80 | HR 88 | Temp 99.2°F | Resp 20 | Ht 69.0 in | Wt 202.0 lb

## 2014-02-24 DIAGNOSIS — F17200 Nicotine dependence, unspecified, uncomplicated: Secondary | ICD-10-CM

## 2014-02-24 DIAGNOSIS — J309 Allergic rhinitis, unspecified: Secondary | ICD-10-CM

## 2014-02-24 DIAGNOSIS — J069 Acute upper respiratory infection, unspecified: Secondary | ICD-10-CM

## 2014-02-24 DIAGNOSIS — B9789 Other viral agents as the cause of diseases classified elsewhere: Secondary | ICD-10-CM

## 2014-02-24 MED ORDER — NYSTATIN 100000 UNIT/ML MT SUSP: 5.0000 mL | Freq: Four times a day (QID) | OROMUCOSAL | Status: DC

## 2014-02-24 MED ORDER — HYDROCODONE-HOMATROPINE 5-1.5 MG/5ML PO SYRP: 5.0000 mL | ORAL_SOLUTION | Freq: Four times a day (QID) | ORAL | Status: DC | PRN

## 2014-02-24 NOTE — Progress Notes (Signed)
Subjective:    Patient ID: Sean Wu, male    DOB: [birth year], 43 y.o.   MRN: [id number]  HPI  43 year old patient, who presents with a 3 to four-day history of cough, headache, congestion, low-grade fever and general sense of unwellness. No shortness of breath, productive cough, wheezing.  Past Medical History  Diagnosis Date  . TESTICULAR HYPOFUNCTION 06/12/2009  . SMOKER 06/12/2009  . DEPRESSION 05/10/2009  . SLEEP APNEA, OBSTRUCTIVE 06/12/2009  . ALLERGIC RHINITIS 05/10/2009  . GERD 05/10/2009  . WEIGHT GAIN 05/10/2009  . LIBIDO, DECREASED 05/10/2009  . Osgood-Schlatter's disease   . Anxiety   . Chronic headaches   . Gastroparesis   . DDD (degenerative disc disease)     History   Social History  . Marital Status: Married    Spouse Name: N/A    Number of Children: 0  . Years of Education: N/A   Occupational History  . sales    Social History Main Topics  . Smoking status: Current Every Day Smoker -- 0.50 packs/day for 15 years    Types: Cigarettes  . Smokeless tobacco: Former Systems developer    Types: [place] date: 09/01/1998     Comment: tobacco info given 08/01/13  . Alcohol Use: Yes     Comment: social  . Drug Use: No  . Sexual Activity: Not on file   Other Topics Concern  . Not on file   Social History Narrative  . No narrative on file    Past Surgical History  Procedure Laterality Date  . Lasik Bilateral   . Ankle surgery Left   . Colonoscopy  2014    normal    Family History  Problem Relation Age of Onset  . Heart disease Maternal Grandfather   . Irritable bowel syndrome Father   . Alcoholism Father   . Heart attack Father   . Colon cancer Neg Hx   . Rectal cancer Neg Hx   . Stomach cancer Neg Hx     No Known Allergies  Current Outpatient Prescriptions on File Prior to Visit  Medication Sig Dispense Refill  . ALPRAZolam (XANAX) 1 MG tablet Take 1 mg by mouth 3 (three) times daily as needed for anxiety.       . Asenapine Maleate (SAPHRIS) 10  MG SUBL Place 2 tablets under the tongue every evening.       Hinda Kehr 30 MG/ACT SOLN APPLY 2 ACTUATIONS AND APPLY EVERY MORNING  90 mL  1  . gabapentin (NEURONTIN) 300 MG capsule Take 1 cap in AM, 2 caps in PM  90 capsule  6  . lamoTRIgine (LAMICTAL) 150 MG tablet Take 300 mg by mouth every morning.       Marland Kitchen LINZESS 145 MCG CAPS capsule TAKE 1 CAPSULE EVERY DAY  30 capsule  0  . methocarbamol (ROBAXIN) 500 MG tablet Take 500 mg by mouth 3 (three) times daily.      . naproxen (NAPROSYN) 250 MG tablet Take 250 mg by mouth 3 (three) times daily with meals.      Marland Kitchen omeprazole (PRILOSEC) 20 MG capsule Take 20 mg by mouth daily.       . polyethylene glycol powder (GLYCOLAX/MIRALAX) powder Take 1 Container by mouth 2 (two) times daily.       . polyethylene glycol powder (GLYCOLAX/MIRALAX) powder USE 1 CAPFUL (17 GRAMS) EVERY DAY  255 g  3  . promethazine (PHENERGAN) 25 MG tablet Take 25 mg by mouth every  6 (six) hours as needed.       Marland Kitchen QUEtiapine (SEROQUEL) 100 MG tablet Take 100 mg by mouth daily.       . SUMAtriptan (IMITREX) 100 MG tablet May repeat in 2 hours if headache persists or recurs.  Maximum dose 2 tablets in 24 hours  6 tablet  0  . traMADol (ULTRAM) 50 MG tablet Take 1 tablet (50 mg total) by mouth every 6 (six) hours as needed.  60 tablet  0  . zolpidem (AMBIEN) 10 MG tablet Take 10 mg by mouth at bedtime as needed.       . [DISCONTINUED] omeprazole (PRILOSEC OTC) 20 MG tablet 1 tablet twice daily. Do not eat for one hour after ingestion of the medication  60 tablet  11   No current facility-administered medications on file prior to visit.    BP 110/80  Pulse 88  Temp(Src) 99.2 F (37.3 C) (Oral)  Resp 20  Ht 5\' 9"  (1.753 m)  Wt 202 lb (91.627 kg)  BMI 29.82 kg/m2  SpO2 97%       Review of Systems  Constitutional: Positive for fever, activity change, appetite change and fatigue. Negative for chills.  HENT: Positive for congestion and sinus pressure. Negative for dental  problem, ear pain, hearing loss, sore throat, tinnitus, trouble swallowing and voice change.   Eyes: Negative for pain, discharge and visual disturbance.  Respiratory: Positive for cough. Negative for chest tightness, wheezing and stridor.   Cardiovascular: Negative for chest pain, palpitations and leg swelling.  Gastrointestinal: Negative for nausea, vomiting, abdominal pain, diarrhea, constipation, blood in stool and abdominal distention.  Genitourinary: Negative for urgency, hematuria, flank pain, discharge, difficulty urinating and genital sores.  Musculoskeletal: Negative for arthralgias, back pain, gait problem, joint swelling, myalgias and neck stiffness.  Skin: Negative for rash.  Neurological: Negative for dizziness, syncope, speech difficulty, weakness, numbness and headaches.  Hematological: Negative for adenopathy. Does not bruise/bleed easily.  Psychiatric/Behavioral: Negative for behavioral problems and dysphoric mood. The patient is not nervous/anxious.        Objective:   Physical Exam  Constitutional: He is oriented to person, place, and time. He appears well-developed.  HENT:  Head: Normocephalic.  Right Ear: External ear normal.  Left Ear: External ear normal.  Oropharynx slightly erythematous White coating of tongue  Eyes: Conjunctivae and EOM are normal.  Neck: Normal range of motion.  Cardiovascular: Normal rate, regular rhythm and normal heart sounds.   Pulmonary/Chest: Breath sounds normal. No respiratory distress. He has no wheezes. He has no rales.  Abdominal: Bowel sounds are normal.  Musculoskeletal: Normal range of motion. He exhibits no edema and no tenderness.  Neurological: He is alert and oriented to person, place, and time.  Psychiatric: He has a normal mood and affect. His behavior is normal.          Assessment & Plan:  Viral URI with cough History of allergic rhinitis Tobacco use Glossitis.  Possible thrush.  We'll treat with Dukes Magic  mouthwash  We'll treat symptomatically

## 2014-02-24 NOTE — Patient Instructions (Signed)
Acute bronchitis symptoms for less than 10 days are generally not helped by antibiotics.  Take over-the-counter expectorants and cough medications such as  Mucinex DM.  Call if there is no improvement in 5 to 7 days or if  you develop worsening cough, fever, or new symptoms, such as shortness of breath or chest pain.   

## 2014-02-24 NOTE — Progress Notes (Signed)
Pre-visit discussion using our clinic review tool. No additional management support is needed unless otherwise documented below in the visit note.  

## 2014-03-02 ENCOUNTER — Telehealth: Payer: Self-pay | Admitting: Internal Medicine

## 2014-03-02 MED ORDER — HYDROCODONE-HOMATROPINE 5-1.5 MG/5ML PO SYRP: 5.0000 mL | ORAL_SOLUTION | Freq: Four times a day (QID) | ORAL | Status: DC | PRN

## 2014-03-02 NOTE — Telephone Encounter (Signed)
Pt notified Rx ready for pickup. Rx printed and signed.  

## 2014-03-02 NOTE — Telephone Encounter (Signed)
ok 

## 2014-03-02 NOTE — Telephone Encounter (Signed)
Dr. Raliegh Ip, okay to refill Hycodan cough syrup? Last Rx was 6/26

## 2014-03-02 NOTE — Telephone Encounter (Signed)
Pt req rx on HYDROcodone-homatropine (HYCODAN) 5-1.5 MG/5ML syrup

## 2014-03-06 ENCOUNTER — Ambulatory Visit (INDEPENDENT_AMBULATORY_CARE_PROVIDER_SITE_OTHER): Payer: 59 | Admitting: Internal Medicine

## 2014-03-06 ENCOUNTER — Ambulatory Visit (INDEPENDENT_AMBULATORY_CARE_PROVIDER_SITE_OTHER)
Admission: RE | Admit: 2014-03-06 | Discharge: 2014-03-06 | Disposition: A | Discharge status: Home / Self Care | Payer: 59 | Source: Ambulatory Visit | Attending: Internal Medicine | Admitting: Internal Medicine

## 2014-03-06 ENCOUNTER — Encounter: Payer: Self-pay | Admitting: Internal Medicine

## 2014-03-06 VITALS — BP 130/90 | HR 132 | Temp 100.0°F | Resp 20 | Ht 69.0 in | Wt 200.0 lb

## 2014-03-06 DIAGNOSIS — F17200 Nicotine dependence, unspecified, uncomplicated: Secondary | ICD-10-CM

## 2014-03-06 DIAGNOSIS — J189 Pneumonia, unspecified organism: Secondary | ICD-10-CM

## 2014-03-06 MED ORDER — AZITHROMYCIN 250 MG PO TABS: ORAL_TABLET | ORAL | Status: DC

## 2014-03-06 MED ORDER — CEFTRIAXONE SODIUM 1 G IJ SOLR
1.0000 g | Freq: Once | INTRAMUSCULAR | Status: AC
  Administered 2014-03-06: 1 g via INTRAMUSCULAR

## 2014-03-06 MED ORDER — HYDROCODONE-HOMATROPINE 5-1.5 MG/5ML PO SYRP: 5.0000 mL | ORAL_SOLUTION | Freq: Four times a day (QID) | ORAL | Status: DC | PRN

## 2014-03-06 NOTE — Patient Instructions (Addendum)
Hold seroquel (quetiapine) and Saphris (asenapine) for 10 days  Antibiotic therapy was prescribed for the child due to specific medical indications.  Take ([phone] mg of Tylenol every 6 hours as needed for pain relief or fever.  Avoid taking more than 3000 mg in a 24-hour period (  This may cause liver damage).  Drink as much fluid as you  can tolerate over the next few days  Chest x-ray as discussed  Report to the emergency department if there is any clinical deteriorationPneumonia, Adult Pneumonia is an infection of the lungs.  CAUSES Pneumonia may be caused by bacteria or a virus. Usually, these infections are caused by breathing infectious particles into the lungs (respiratory tract). SYMPTOMS   Cough.  Fever.  Chest pain.  Increased rate of breathing.  Wheezing.  Mucus production. DIAGNOSIS  If you have the common symptoms of pneumonia, your caregiver will typically confirm the diagnosis with a chest X-ray. The X-ray will show an abnormality in the lung (pulmonary infiltrate) if you have pneumonia. Other tests of your blood, urine, or sputum may be done to find the specific cause of your pneumonia. Your caregiver may also do tests (blood gases or pulse oximetry) to see how well your lungs are working. TREATMENT  Some forms of pneumonia may be spread to other people when you cough or sneeze. You may be asked to wear a mask before and during your exam. Pneumonia that is caused by bacteria is treated with antibiotic medicine. Pneumonia that is caused by the influenza virus may be treated with an antiviral medicine. Most other viral infections must run their course. These infections will not respond to antibiotics.  PREVENTION A pneumococcal shot (vaccine) is available to prevent a common bacterial cause of pneumonia. This is usually suggested for:  People over 72 years old.  Patients on chemotherapy.  People with chronic lung problems, such as bronchitis or  emphysema.  People with immune system problems. If you are over 65 or have a high risk condition, you may receive the pneumococcal vaccine if you have not received it before. In some countries, a routine influenza vaccine is also recommended. This vaccine can help prevent some cases of pneumonia.You may be offered the influenza vaccine as part of your care. If you smoke, it is time to quit. You may receive instructions on how to stop smoking. Your caregiver can provide medicines and counseling to help you quit. HOME CARE INSTRUCTIONS   Cough suppressants may be used if you are losing too much rest. However, coughing protects you by clearing your lungs. You should avoid using cough suppressants if you can.  Your caregiver may have prescribed medicine if he or she thinks your pneumonia is caused by a bacteria or influenza. Finish your medicine even if you start to feel better.  Your caregiver may also prescribe an expectorant. This loosens the mucus to be coughed up.  Only take over-the-counter or prescription medicines for pain, discomfort, or fever as directed by your caregiver.  Do not smoke. Smoking is a common cause of bronchitis and can contribute to pneumonia. If you are a smoker and continue to smoke, your cough may last several weeks after your pneumonia has cleared.  A cold steam vaporizer or humidifier in your room or home may help loosen mucus.  Coughing is often worse at night. Sleeping in a semi-upright position in a recliner or using a couple pillows under your head will help with this.  Get rest as you feel it  is needed. Your body will usually let you know when you need to rest. SEEK IMMEDIATE MEDICAL CARE IF:   Your illness becomes worse. This is especially true if you are elderly or weakened from any other disease.  You cannot control your cough with suppressants and are losing sleep.  You begin coughing up blood.  You develop pain which is getting worse or is  uncontrolled with medicines.  You have a fever.  Any of the symptoms which initially brought you in for treatment are getting worse rather than better.  You develop shortness of breath or chest pain. MAKE SURE YOU:   Understand these instructions.  Will watch your condition.  Will get help right away if you are not doing well or get worse. Document Released: 08/18/2005 Document Revised: 11/10/2011 Document Reviewed: 11/07/2010 [facility] Patient Information 2015 [place], [place]. This information is not intended to replace advice given to you by your health care provider. Make sure you discuss any questions you have with your health care provider.

## 2014-03-06 NOTE — Progress Notes (Signed)
Subjective:    Patient ID: Sean Wu, male    DOB: 15-Mar-1971, 43 y.o.   MRN: [id number]  HPI 43 year old patient, smoker, who was seen approximately 2 weeks ago and treated symptomatically for a URI with cough.  More recently, he has developed increasing cough, right-sided chest pain, fever, and general sense of unwellness.  Cough remains largely nonproductive.  Right-sided chest pain is worse with coughing and deep inspiration.  He is felt unwell with poor appetite.  Past Medical History  Diagnosis Date  . TESTICULAR HYPOFUNCTION 06/12/2009  . SMOKER 06/12/2009  . DEPRESSION 05/10/2009  . SLEEP APNEA, OBSTRUCTIVE 06/12/2009  . ALLERGIC RHINITIS 05/10/2009  . GERD 05/10/2009  . WEIGHT GAIN 05/10/2009  . LIBIDO, DECREASED 05/10/2009  . Osgood-Schlatter's disease   . Anxiety   . Chronic headaches   . Gastroparesis   . DDD (degenerative disc disease)     History   Social History  . Marital Status: Married    Spouse Name: N/A    Number of Children: 0  . Years of Education: N/A   Occupational History  . sales    Social History Main Topics  . Smoking status: Current Every Day Smoker -- 0.50 packs/day for 15 years    Types: Cigarettes  . Smokeless tobacco: Former Systems developer    Types: [place] date: 09/01/1998     Comment: tobacco info given 08/01/13  . Alcohol Use: Yes     Comment: social  . Drug Use: No  . Sexual Activity: Not on file   Other Topics Concern  . Not on file   Social History Narrative  . No narrative on file    Past Surgical History  Procedure Laterality Date  . Lasik Bilateral   . Ankle surgery Left   . Colonoscopy  2014    normal    Family History  Problem Relation Age of Onset  . Heart disease Maternal Grandfather   . Irritable bowel syndrome Father   . Alcoholism Father   . Heart attack Father   . Colon cancer Neg Hx   . Rectal cancer Neg Hx   . Stomach cancer Neg Hx     No Known Allergies  Current Outpatient Prescriptions on File Prior  to Visit  Medication Sig Dispense Refill  . ALPRAZolam (XANAX) 1 MG tablet Take 1 mg by mouth 3 (three) times daily as needed for anxiety.       Marland Kitchen ammonium lactate (AMLACTIN) 12 % cream       . Asenapine Maleate (SAPHRIS) 10 MG SUBL Place 2 tablets under the tongue every evening.       Hinda Kehr 30 MG/ACT SOLN APPLY 2 ACTUATIONS AND APPLY EVERY MORNING  90 mL  1  . gabapentin (NEURONTIN) 300 MG capsule Take 1 cap in AM, 2 caps in PM  90 capsule  6  . HYDROcodone-homatropine (HYCODAN) 5-1.5 MG/5ML syrup Take 5 mLs by mouth every 6 (six) hours as needed for cough.  120 mL  0  . lamoTRIgine (LAMICTAL) 150 MG tablet Take 300 mg by mouth every morning.       Marland Kitchen LINZESS 145 MCG CAPS capsule TAKE 1 CAPSULE EVERY DAY  30 capsule  0  . methocarbamol (ROBAXIN) 500 MG tablet Take 500 mg by mouth 3 (three) times daily.      . naproxen (NAPROSYN) 250 MG tablet Take 250 mg by mouth 3 (three) times daily with meals.      Marland Kitchen  nystatin (MYCOSTATIN) 100000 UNIT/ML suspension Take 5 mLs (500,000 Units total) by mouth 4 (four) times daily.  60 mL  0  . omeprazole (PRILOSEC) 20 MG capsule Take 20 mg by mouth daily.       . polyethylene glycol powder (GLYCOLAX/MIRALAX) powder USE 1 CAPFUL (17 GRAMS) EVERY DAY  255 g  3  . promethazine (PHENERGAN) 25 MG tablet Take 25 mg by mouth every 6 (six) hours as needed.       Marland Kitchen QUEtiapine (SEROQUEL) 100 MG tablet Take 100 mg by mouth daily.       . SUMAtriptan (IMITREX) 100 MG tablet May repeat in 2 hours if headache persists or recurs.  Maximum dose 2 tablets in 24 hours  6 tablet  0  . traMADol (ULTRAM) 50 MG tablet Take 1 tablet (50 mg total) by mouth every 6 (six) hours as needed.  60 tablet  0  . zolpidem (AMBIEN) 10 MG tablet Take 10 mg by mouth at bedtime as needed.       . [DISCONTINUED] omeprazole (PRILOSEC OTC) 20 MG tablet 1 tablet twice daily. Do not eat for one hour after ingestion of the medication  60 tablet  11   No current facility-administered medications on file  prior to visit.    BP 130/90  Pulse 132  Temp(Src) 100 F (37.8 C) (Oral)  Resp 20  Ht 5\' 9"  (1.753 m)  Wt 200 lb (90.719 kg)  BMI 29.52 kg/m2  SpO2 93%      Review of Systems  Constitutional: Positive for fever, activity change, appetite change and fatigue. Negative for chills and diaphoresis.  HENT: Negative for congestion, dental problem, ear pain, hearing loss, sore throat, tinnitus, trouble swallowing and voice change.   Eyes: Negative for pain, discharge and visual disturbance.  Respiratory: Positive for cough. Negative for chest tightness, wheezing and stridor.   Cardiovascular: Positive for chest pain. Negative for palpitations and leg swelling.  Gastrointestinal: Negative for nausea, vomiting, abdominal pain, diarrhea, constipation, blood in stool and abdominal distention.  Genitourinary: Negative for urgency, hematuria, flank pain, discharge, difficulty urinating and genital sores.  Musculoskeletal: Negative for arthralgias, back pain, gait problem, joint swelling, myalgias and neck stiffness.  Skin: Negative for rash.  Neurological: Negative for dizziness, syncope, speech difficulty, weakness, numbness and headaches.  Hematological: Negative for adenopathy. Does not bruise/bleed easily.  Psychiatric/Behavioral: Negative for behavioral problems and dysphoric mood. The patient is not nervous/anxious.        Objective:   Physical Exam  Constitutional: He is oriented to person, place, and time. He appears well-developed. No distress.  Appears unwell, but in no distress Temperature 100 Pulse rate 132 on arrival- presently 120 O2 saturation 93-94%  HENT:  Head: Normocephalic.  Right Ear: External ear normal.  Left Ear: External ear normal.  Eyes: Conjunctivae and EOM are normal.  Neck: Normal range of motion.  Cardiovascular: Normal heart sounds.   Resting tachycardia  Pulmonary/Chest: Effort normal. No respiratory distress. He has rales.  No tachypnea or  increased work of breathing Rales noted involving the right mid and lower lung field  Abdominal: Bowel sounds are normal.  Musculoskeletal: Normal range of motion. He exhibits no edema and no tenderness.  Neurological: He is alert and oriented to person, place, and time.  Psychiatric: He has a normal mood and affect. His behavior is normal.          Assessment & Plan:   Community acquired pneumonia.  Which with Rocephin 1 g IM  and started on azithromycin.  We'll force fluids take Tylenol for fever control.  He will report to the ED for consideration of possible hospital admission if there is any clinical deterioration

## 2014-03-06 NOTE — Progress Notes (Signed)
Pre visit review using our clinic review tool, if applicable. No additional management support is needed unless otherwise documented below in the visit note. 

## 2014-03-07 ENCOUNTER — Ambulatory Visit: Payer: 59 | Admitting: Family Medicine

## 2014-03-07 DIAGNOSIS — Z0289 Encounter for other administrative examinations: Secondary | ICD-10-CM

## 2014-03-08 ENCOUNTER — Encounter (HOSPITAL_COMMUNITY): Payer: Self-pay | Admitting: Emergency Medicine

## 2014-03-08 ENCOUNTER — Emergency Department (HOSPITAL_COMMUNITY)
Admission: EM | Admit: 2014-03-08 | Discharge: 2014-03-08 | Disposition: A | Discharge status: Home / Self Care | Payer: 59 | Attending: Emergency Medicine | Admitting: Emergency Medicine

## 2014-03-08 ENCOUNTER — Emergency Department (HOSPITAL_COMMUNITY): Payer: 59

## 2014-03-08 DIAGNOSIS — Z791 Long term (current) use of non-steroidal anti-inflammatories (NSAID): Secondary | ICD-10-CM | POA: Insufficient documentation

## 2014-03-08 DIAGNOSIS — K219 Gastro-esophageal reflux disease without esophagitis: Secondary | ICD-10-CM | POA: Insufficient documentation

## 2014-03-08 DIAGNOSIS — J90 Pleural effusion, not elsewhere classified: Secondary | ICD-10-CM | POA: Insufficient documentation

## 2014-03-08 DIAGNOSIS — J159 Unspecified bacterial pneumonia: Secondary | ICD-10-CM | POA: Insufficient documentation

## 2014-03-08 DIAGNOSIS — R091 Pleurisy: Secondary | ICD-10-CM

## 2014-03-08 DIAGNOSIS — Z79899 Other long term (current) drug therapy: Secondary | ICD-10-CM | POA: Insufficient documentation

## 2014-03-08 DIAGNOSIS — F17200 Nicotine dependence, unspecified, uncomplicated: Secondary | ICD-10-CM | POA: Insufficient documentation

## 2014-03-08 DIAGNOSIS — J189 Pneumonia, unspecified organism: Secondary | ICD-10-CM

## 2014-03-08 DIAGNOSIS — IMO0002 Reserved for concepts with insufficient information to code with codable children: Secondary | ICD-10-CM | POA: Insufficient documentation

## 2014-03-08 DIAGNOSIS — F3289 Other specified depressive episodes: Secondary | ICD-10-CM | POA: Insufficient documentation

## 2014-03-08 DIAGNOSIS — Z792 Long term (current) use of antibiotics: Secondary | ICD-10-CM | POA: Insufficient documentation

## 2014-03-08 DIAGNOSIS — F329 Major depressive disorder, single episode, unspecified: Secondary | ICD-10-CM | POA: Insufficient documentation

## 2014-03-08 DIAGNOSIS — F411 Generalized anxiety disorder: Secondary | ICD-10-CM | POA: Insufficient documentation

## 2014-03-08 DIAGNOSIS — R0682 Tachypnea, not elsewhere classified: Secondary | ICD-10-CM | POA: Insufficient documentation

## 2014-03-08 DIAGNOSIS — R000 Tachycardia, unspecified: Secondary | ICD-10-CM | POA: Insufficient documentation

## 2014-03-08 LAB — BASIC METABOLIC PANEL
Anion gap: 12 (ref 5–15)
BUN: 9 mg/dL (ref 6–23)
CHLORIDE: 98 meq/L (ref 96–112)
CO2: 27 meq/L (ref 19–32)
CREATININE: 0.79 mg/dL (ref 0.50–1.35)
Calcium: 9.4 mg/dL (ref 8.4–10.5)
GFR calc Af Amer: >90 mL/min (ref >90)
GFR calc non Af Amer: >90 mL/min (ref >90)
GLUCOSE: 113 mg/dL — AB (ref 70–99)
POTASSIUM: 4.4 meq/L (ref 3.7–5.3)
Sodium: 137 mEq/L (ref 137–147)

## 2014-03-08 LAB — CBC WITH DIFFERENTIAL/PLATELET
BASOS PCT: 0 % (ref 0–1)
Basophils Absolute: 0 10*3/uL (ref 0.0–0.1)
Eosinophils Absolute: 0.3 10*3/uL (ref 0.0–0.7)
Eosinophils Relative: 2 % (ref 0–5)
HEMATOCRIT: 43.9 % (ref 39.0–52.0)
HEMOGLOBIN: 14.9 g/dL (ref 13.0–17.0)
LYMPHS ABS: 1 10*3/uL (ref 0.7–4.0)
Lymphocytes Relative: 6 % — ABNORMAL LOW (ref 12–46)
MCH: 31.1 pg (ref 26.0–34.0)
MCHC: 33.9 g/dL (ref 30.0–36.0)
MCV: 91.6 fL (ref 78.0–100.0)
MONOS PCT: 8 % (ref 3–12)
Monocytes Absolute: 1.3 10*3/uL — ABNORMAL HIGH (ref 0.1–1.0)
NEUTROS ABS: 13.5 10*3/uL — AB (ref 1.7–7.7)
NEUTROS PCT: 84 % — AB (ref 43–77)
Platelets: 396 10*3/uL (ref 150–400)
RBC: 4.79 MIL/uL (ref 4.22–5.81)
RDW: 13.3 % (ref 11.5–15.5)
WBC: 16.1 10*3/uL — ABNORMAL HIGH (ref 4.0–10.5)

## 2014-03-08 MED ORDER — ALBUTEROL SULFATE HFA 108 (90 BASE) MCG/ACT IN AERS
2.0000 | INHALATION_SPRAY | RESPIRATORY_TRACT | Status: DC
  Administered 2014-03-08: 2 via RESPIRATORY_TRACT
  Filled 2014-03-08: qty 6.7

## 2014-03-08 MED ORDER — SODIUM CHLORIDE 0.9 % IV SOLN
Freq: Once | INTRAVENOUS | Status: AC
  Administered 2014-03-08: 150 mL/h via INTRAVENOUS

## 2014-03-08 MED ORDER — OXYCODONE-ACETAMINOPHEN 5-325 MG PO TABS: 1.0000 | ORAL_TABLET | ORAL | Status: DC | PRN

## 2014-03-08 MED ORDER — ONDANSETRON HCL 4 MG/2ML IJ SOLN
4.0000 mg | Freq: Once | INTRAMUSCULAR | Status: AC
  Administered 2014-03-08: 4 mg via INTRAVENOUS
  Filled 2014-03-08: qty 2

## 2014-03-08 MED ORDER — HYDROMORPHONE HCL PF 1 MG/ML IJ SOLN
1.0000 mg | Freq: Once | INTRAMUSCULAR | Status: AC
  Administered 2014-03-08: 1 mg via INTRAVENOUS
  Filled 2014-03-08: qty 1

## 2014-03-08 MED ORDER — PREDNISONE 20 MG PO TABS: 60.0000 mg | ORAL_TABLET | Freq: Every day | ORAL | Status: DC

## 2014-03-08 NOTE — ED Provider Notes (Signed)
CSN: [id number]     Arrival date & time 03/08/14  0754 History   First MD Initiated Contact with Patient 03/08/14 [phone]     Chief Complaint  Patient presents with  . Ribcage pain   . Shortness of Breath  . Cough     (Consider location/radiation/quality/duration/timing/severity/associated sxs/prior Treatment) HPI Comments: Patient presents for evaluation of severe right sided chest pain and cough. Patient reports that he has been experiencing cough and chest congestion for approximately 2 weeks. He was seen by his primary care doctor 2 days ago and had an x-ray which showed pneumonia. He was placed on Zithromax and given Hycodan. His symptoms have not improved. Patient feels much worse this morning upon awakening. He is short of breath and having severe sharp stabbing pains in the right side.  Patient is a 43 y.o. male presenting with shortness of breath and cough.  Shortness of Breath Associated symptoms: cough   Cough Associated symptoms: shortness of breath     Past Medical History  Diagnosis Date  . TESTICULAR HYPOFUNCTION 06/12/2009  . SMOKER 06/12/2009  . DEPRESSION 05/10/2009  . SLEEP APNEA, OBSTRUCTIVE 06/12/2009  . ALLERGIC RHINITIS 05/10/2009  . GERD 05/10/2009  . WEIGHT GAIN 05/10/2009  . LIBIDO, DECREASED 05/10/2009  . Osgood-Schlatter's disease   . Anxiety   . Chronic headaches   . Gastroparesis   . DDD (degenerative disc disease)    Past Surgical History  Procedure Laterality Date  . Lasik Bilateral   . Ankle surgery Left   . Colonoscopy  2014    normal   Family History  Problem Relation Age of Onset  . Heart disease Maternal Grandfather   . Irritable bowel syndrome Father   . Alcoholism Father   . Heart attack Father   . Colon cancer Neg Hx   . Rectal cancer Neg Hx   . Stomach cancer Neg Hx    History  Substance Use Topics  . Smoking status: Current Every Day Smoker -- 0.50 packs/day for 15 years    Types: Cigarettes  . Smokeless tobacco: Former Systems developer     Types: [place] date: 09/01/1998     Comment: tobacco info given 08/01/13  . Alcohol Use: Yes     Comment: social    Review of Systems  Respiratory: Positive for cough and shortness of breath.   All other systems reviewed and are negative.     Allergies  Review of patient's allergies indicates no known allergies.  Home Medications   Prior to Admission medications   Medication Sig Start Date End Date Taking? Authorizing Provider  ALPRAZolam Duanne Moron) 1 MG tablet Take 1 mg by mouth 3 (three) times daily as needed for anxiety.  12/08/11   Historical Provider, MD  ammonium lactate (AMLACTIN) 12 % cream  01/05/14   Historical Provider, MD  Asenapine Maleate (SAPHRIS) 10 MG SUBL Place 2 tablets under the tongue every evening.     Historical Provider, MD  AXIRON 30 MG/ACT SOLN APPLY 2 ACTUATIONS AND APPLY EVERY MORNING    Marletta Lor, MD  azithromycin [facility]) 250 MG tablet 2 tablets once daily for 3 consecutive days 03/06/14   Marletta Lor, MD  gabapentin (NEURONTIN) 300 MG capsule Take 1 cap in AM, 2 caps in PM 11/24/13   Cameron Sprang, MD  HYDROcodone-homatropine [facility]) 5-1.5 MG/5ML syrup Take 5 mLs by mouth every 6 (six) hours as needed for cough. 03/02/14   Marletta Lor, MD  HYDROcodone-homatropine [facility])  5-1.5 MG/5ML syrup Take 5 mLs by mouth every 6 (six) hours as needed for cough. 03/06/14   Marletta Lor, MD  lamoTRIgine (LAMICTAL) 150 MG tablet Take 300 mg by mouth every morning.     Historical Provider, MD  LINZESS 145 MCG CAPS capsule TAKE 1 CAPSULE EVERY DAY 12/27/13   Milus Banister, MD  methocarbamol (ROBAXIN) 500 MG tablet Take 500 mg by mouth 3 (three) times daily.    Historical Provider, MD  naproxen (NAPROSYN) 250 MG tablet Take 250 mg by mouth 3 (three) times daily with meals.    Historical Provider, MD  nystatin (MYCOSTATIN) 100000 UNIT/ML suspension Take 5 mLs (500,000 Units total) by mouth 4 (four) times daily. 02/24/14   Marletta Lor, MD  omeprazole (PRILOSEC) 20 MG capsule Take 20 mg by mouth daily.  12/11/13   Historical Provider, MD  polyethylene glycol powder (GLYCOLAX/MIRALAX) powder USE 1 CAPFUL (17 GRAMS) EVERY DAY    Milus Banister, MD  promethazine (PHENERGAN) 25 MG tablet Take 25 mg by mouth every 6 (six) hours as needed.  12/11/13   Historical Provider, MD  QUEtiapine (SEROQUEL) 100 MG tablet Take 100 mg by mouth daily.  12/15/13   Historical Provider, MD  SUMAtriptan (IMITREX) 100 MG tablet May repeat in 2 hours if headache persists or recurs.  Maximum dose 2 tablets in 24 hours 01/09/14   Marletta Lor, MD  traMADol (ULTRAM) 50 MG tablet Take 1 tablet (50 mg total) by mouth every 6 (six) hours as needed. 01/09/14   Marletta Lor, MD  traZODone (DESYREL) 50 MG tablet  12/27/13   Historical Provider, MD  zolpidem (AMBIEN) 10 MG tablet Take 10 mg by mouth at bedtime as needed.  12/26/13   Historical Provider, MD   BP 123/88  Pulse 107  Temp(Src) 98.4 F (36.9 C) (Oral)  Resp 24  SpO2 93% Physical Exam  Constitutional: He is oriented to person, place, and time. He appears well-developed and well-nourished. He appears distressed.  HENT:  Head: Normocephalic and atraumatic.  Right Ear: Hearing normal.  Left Ear: Hearing normal.  Nose: Nose normal.  Mouth/Throat: Oropharynx is clear and moist and mucous membranes are normal.  Eyes: Conjunctivae and EOM are normal. Pupils are equal, round, and reactive to light.  Neck: Normal range of motion. Neck supple.  Cardiovascular: Regular rhythm, S1 normal and S2 normal.  Tachycardia present.  Exam reveals no gallop and no friction rub.   No murmur heard. Pulmonary/Chest: Tachypnea noted. No respiratory distress. He has decreased breath sounds. He exhibits no tenderness.  Abdominal: Soft. Normal appearance and bowel sounds are normal. There is no hepatosplenomegaly. There is no tenderness. There is no rebound, no guarding, no tenderness at McBurney's  point and negative Murphy's sign. No hernia.  Musculoskeletal: Normal range of motion.  Neurological: He is alert and oriented to person, place, and time. He has normal strength. No cranial nerve deficit or sensory deficit. Coordination normal. GCS eye subscore is 4. GCS verbal subscore is 5. GCS motor subscore is 6.  Skin: Skin is warm, dry and intact. No rash noted. No cyanosis.  Psychiatric: He has a normal mood and affect. His speech is normal and behavior is normal. Thought content normal.    ED Course  Procedures (including critical care time) Labs Review Labs Reviewed  CBC WITH DIFFERENTIAL  BASIC METABOLIC PANEL    Imaging Review Dg Chest 2 View  03/06/2014   CLINICAL DATA:  Fever, pneumonia, cough,  shortness of breath.  EXAM: CHEST  2 VIEW  COMPARISON:  06/22/2013  FINDINGS: Consolidation noted in the right middle loop compatible with pneumonia. Low lung volumes with minimal left base atelectasis. Heart is normal size. No effusions. No acute bony abnormality.  IMPRESSION: Right middle lobe pneumonia.  Low lung volumes.   Electronically Signed   By: Rolm Baptise M.D.   On: 03/06/2014 13:38     EKG Interpretation None      MDM   Final diagnoses:  None  community acquired pneumonia Pleurisy  Patient presented to the ER for evaluation of right-sided chest pain. He did not tenderness to palpation, but pain was reproducible with movements of the torso. Patient diagnosed 3 days ago with pneumonia, treated with a 3 day course of Zithromax. Chest x-ray is unchanged. It's too early to determine if this is a failure of Zithromax, but there is certainly no progression of the pneumonia. Pain is likely secondary to pleurisy from the pneumonia. Will add albuterol, analgesia, prednisone, incentive spirometry. Followup with primary doctor in one week for recheck. Return if symptoms worsen.    Orpah Greek, MD 03/08/14 1049

## 2014-03-08 NOTE — ED Notes (Signed)
Pt c/o increasing SOB, cough, and intermittent R ribcage pain x 2 weeks.  Pain score 10/10.  Pt report being diagnosed w/ PNA x 2 days ago.

## 2014-03-08 NOTE — ED Notes (Signed)
Pt stuck 3 times, all unsuccessful for blood specimen collection. Pt has Normal saline runing now. Will re-attempt after pt receives some IV fluids

## 2014-03-08 NOTE — Discharge Instructions (Signed)
Pneumonia, Adult °Pneumonia is an infection of the lungs.  °CAUSES °Pneumonia may be caused by bacteria or a virus. Usually, these infections are caused by breathing infectious particles into the lungs (respiratory tract). °SYMPTOMS  °· Cough. °· Fever. °· Chest pain. °· Increased rate of breathing. °· Wheezing. °· Mucus production. °DIAGNOSIS  °If you have the common symptoms of pneumonia, your caregiver will typically confirm the diagnosis with a chest X-ray. The X-ray will show an abnormality in the lung (pulmonary infiltrate) if you have pneumonia. Other tests of your blood, urine, or sputum may be done to find the specific cause of your pneumonia. Your caregiver may also do tests (blood gases or pulse oximetry) to see how well your lungs are working. °TREATMENT  °Some forms of pneumonia may be spread to other people when you cough or sneeze. You may be asked to wear a mask before and during your exam. Pneumonia that is caused by bacteria is treated with antibiotic medicine. Pneumonia that is caused by the influenza virus may be treated with an antiviral medicine. Most other viral infections must run their course. These infections will not respond to antibiotics.  °PREVENTION °A pneumococcal shot (vaccine) is available to prevent a common bacterial cause of pneumonia. This is usually suggested for: °· People over 65 years old. °· Patients on chemotherapy. °· People with chronic lung problems, such as bronchitis or emphysema. °· People with immune system problems. °If you are over 65 or have a high risk condition, you may receive the pneumococcal vaccine if you have not received it before. In some countries, a routine influenza vaccine is also recommended. This vaccine can help prevent some cases of pneumonia. You may be offered the influenza vaccine as part of your care. °If you smoke, it is time to quit. You may receive instructions on how to stop smoking. Your caregiver can provide medicines and counseling to  help you quit. °HOME CARE INSTRUCTIONS  °· Cough suppressants may be used if you are losing too much rest. However, coughing protects you by clearing your lungs. You should avoid using cough suppressants if you can. °· Your caregiver may have prescribed medicine if he or she thinks your pneumonia is caused by a bacteria or influenza. Finish your medicine even if you start to feel better. °· Your caregiver may also prescribe an expectorant. This loosens the mucus to be coughed up. °· Only take over-the-counter or prescription medicines for pain, discomfort, or fever as directed by your caregiver. °· Do not smoke. Smoking is a common cause of bronchitis and can contribute to pneumonia. If you are a smoker and continue to smoke, your cough may last several weeks after your pneumonia has cleared. °· A cold steam vaporizer or humidifier in your room or home may help loosen mucus. °· Coughing is often worse at night. Sleeping in a semi-upright position in a recliner or using a couple pillows under your head will help with this. °· Get rest as you feel it is needed. Your body will usually let you know when you need to rest. °SEEK IMMEDIATE MEDICAL CARE IF:  °· Your illness becomes worse. This is especially true if you are elderly or weakened from any other disease. °· You cannot control your cough with suppressants and are losing sleep. °· You begin coughing up blood. °· You develop pain which is getting worse or is uncontrolled with medicines. °· You have a fever. °· Any of the symptoms which initially brought you in for treatment   are getting worse rather than better.  You develop shortness of breath or chest pain. MAKE SURE YOU:   Understand these instructions.  Will watch your condition.  Will get help right away if you are not doing well or get worse. Document Released: 08/18/2005 Document Revised: 11/10/2011 Document Reviewed: 11/07/2010 [facility] Patient Information 2015 [place], [place]. This information is  not intended to replace advice given to you by your health care provider. Make sure you discuss any questions you have with your health care provider.  Pleurisy Pleurisy is an inflammation and swelling of the lining of the lungs (pleura). Because of this inflammation, it hurts to breathe. It can be aggravated by coughing, laughing, or deep breathing. Pleurisy is often caused by an underlying infection or disease.  HOME CARE INSTRUCTIONS  Monitor your pleurisy for any changes. The following actions may help to alleviate any discomfort you are experiencing:  Medicine may help with pain. Only take over-the-counter or prescription medicines for pain, discomfort, or fever as directed by your health care provider.  Only take antibiotic medicine as directed. Make sure to finish it even if you start to feel better. SEEK MEDICAL CARE IF:   Your pain is not controlled with medicine or is increasing.  You have an increase in pus-like (purulent) secretions brought up with coughing. SEEK IMMEDIATE MEDICAL CARE IF:   You have blue or dark lips, fingernails, or toenails.  You are coughing up blood.  You have increased difficulty breathing.  You have continuing pain unrelieved by medicine or pain lasting more than 1 week.  You have pain that radiates into your neck, arms, or jaw.  You develop increased shortness of breath or wheezing.  You develop a fever, rash, vomiting, fainting, or other serious symptoms. MAKE SURE YOU:  Understand these instructions.   Will watch your condition.   Will get help right away if you are not doing well or get worse.  Document Released: 08/18/2005 Document Revised: 04/20/2013 Document Reviewed: 01/30/2013 [facility] Patient Information 2015 [place], [place]. This information is not intended to replace advice given to you by your health care provider. Make sure you discuss any questions you have with your health care provider.

## 2014-03-09 ENCOUNTER — Telehealth: Payer: Self-pay | Admitting: Family Medicine

## 2014-03-09 ENCOUNTER — Telehealth: Payer: Self-pay | Admitting: Internal Medicine

## 2014-03-09 NOTE — Telephone Encounter (Signed)
Noted  

## 2014-03-09 NOTE — Telephone Encounter (Signed)
Pt needs new rx for hydrocodone cough syrup

## 2014-03-09 NOTE — Telephone Encounter (Signed)
Patient no showed for 3-4 wk fu on 7/7.  As of right now no other appts are scheduled with Dr. Tamala Julian.  Please advise.

## 2014-03-10 MED ORDER — HYDROCODONE-HOMATROPINE 5-1.5 MG/5ML PO SYRP: 5.0000 mL | ORAL_SOLUTION | Freq: Four times a day (QID) | ORAL | Status: DC | PRN

## 2014-03-10 NOTE — Telephone Encounter (Signed)
Spoke to pt, asked him if out of cough syrup? Pt said yes, asked how often taking? It is suppose to be every 6 hours as needed. Pt said I think I have been taking it every 4 hours. Asked pt if fever broke? Pt said yes yesterday. Told him okay will refill cough syrup but need to use every 6 hours as needed. Pt verbalized understanding. Rx printed and signed by Dr.Burchette.

## 2014-03-13 ENCOUNTER — Telehealth: Payer: Self-pay | Admitting: Internal Medicine

## 2014-03-13 NOTE — Telephone Encounter (Signed)
Dr. Raliegh Ip, pt wanting something for pain, was seen in ED on 7/8 and prescribed Oxycodone 5/325 mg, #20 and wants refill. Please advise.

## 2014-03-13 NOTE — Telephone Encounter (Signed)
Ok to RF #20

## 2014-03-13 NOTE — Telephone Encounter (Signed)
Pt states he pulled a muscle in his backn fromcoughing and asked for a refill of oxyCODONE-acetaminophen (PERCOCET) 5-325 MG per tablet.  Pt states unless there is something dr can prescribe/ Pt states he doesn't need the rx for cough med now [facility] [address]

## 2014-03-14 MED ORDER — OXYCODONE-ACETAMINOPHEN 5-325 MG PO TABS: 1.0000 | ORAL_TABLET | ORAL | Status: DC | PRN

## 2014-03-14 NOTE — Telephone Encounter (Signed)
Left message on voicemail to call office.  

## 2014-03-14 NOTE — Telephone Encounter (Signed)
Pt called back notified Rx ready for pickup. Rx printed and signed.

## 2014-03-22 ENCOUNTER — Other Ambulatory Visit: Payer: Self-pay | Admitting: Internal Medicine

## 2014-03-23 ENCOUNTER — Telehealth: Payer: Self-pay | Admitting: *Deleted

## 2014-03-23 ENCOUNTER — Ambulatory Visit: Payer: 59 | Admitting: Internal Medicine

## 2014-03-23 NOTE — Telephone Encounter (Signed)
Pt no showed for appt today, left message on cell phone to call back to reschedule

## 2014-03-23 NOTE — Telephone Encounter (Signed)
Pt called back and stated he got stuck in a meeting.  He will reschedule

## 2014-03-24 ENCOUNTER — Ambulatory Visit (INDEPENDENT_AMBULATORY_CARE_PROVIDER_SITE_OTHER): Payer: 59 | Admitting: Internal Medicine

## 2014-03-24 ENCOUNTER — Encounter: Payer: Self-pay | Admitting: Internal Medicine

## 2014-03-24 VITALS — BP 114/82 | Temp 97.9°F | Ht 69.0 in | Wt 199.0 lb

## 2014-03-24 DIAGNOSIS — K219 Gastro-esophageal reflux disease without esophagitis: Secondary | ICD-10-CM

## 2014-03-24 DIAGNOSIS — K3184 Gastroparesis: Secondary | ICD-10-CM

## 2014-03-24 DIAGNOSIS — R110 Nausea: Secondary | ICD-10-CM

## 2014-03-24 MED ORDER — PROCHLORPERAZINE MALEATE 10 MG PO TABS
ORAL_TABLET | ORAL | Status: DC
Start: 2014-03-24 — End: 2014-05-14

## 2014-03-24 MED ORDER — PANTOPRAZOLE SODIUM 40 MG PO TBEC: 40.0000 mg | DELAYED_RELEASE_TABLET | Freq: Every day | ORAL | Status: DC

## 2014-03-24 NOTE — Progress Notes (Signed)
Subjective:    Patient ID: Sean Wu, male    DOB: 01-Jul-1971, 43 y.o.   MRN: [id number]  HPI 43 year old patient who has a history of gastroesophageal reflux disease, as well as gastroparesis.  He has chronic constipation and has been followed by GI in the past.  He presents with a one-week history of nausea without vomiting. He has recovered from a severe episode of community-acquired pneumonia and all his pulmonary complaints have resolved. He remains on medications to control constipation  Wt Readings from Last 3 Encounters:  03/24/14 199 lb (90.266 kg)  03/06/14 200 lb (90.719 kg)  02/24/14 202 lb (91.627 kg)    Past Medical History  Diagnosis Date  . TESTICULAR HYPOFUNCTION 06/12/2009  . SMOKER 06/12/2009  . DEPRESSION 05/10/2009  . SLEEP APNEA, OBSTRUCTIVE 06/12/2009  . ALLERGIC RHINITIS 05/10/2009  . GERD 05/10/2009  . WEIGHT GAIN 05/10/2009  . LIBIDO, DECREASED 05/10/2009  . Osgood-Schlatter's disease   . Anxiety   . Chronic headaches   . Gastroparesis   . DDD (degenerative disc disease)     History   Social History  . Marital Status: Married    Spouse Name: N/A    Number of Children: 0  . Years of Education: N/A   Occupational History  . sales    Social History Main Topics  . Smoking status: Current Every Day Smoker -- 0.50 packs/day for 15 years    Types: Cigarettes  . Smokeless tobacco: Former Systems developer    Types: [place] date: 09/01/1998     Comment: tobacco info given 08/01/13  . Alcohol Use: Yes     Comment: social  . Drug Use: No  . Sexual Activity: Not on file   Other Topics Concern  . Not on file   Social History Narrative  . No narrative on file    Past Surgical History  Procedure Laterality Date  . Lasik Bilateral   . Ankle surgery Left   . Colonoscopy  2014    normal    Family History  Problem Relation Age of Onset  . Heart disease Maternal Grandfather   . Irritable bowel syndrome Father   . Alcoholism Father   . Heart attack  Father   . Colon cancer Neg Hx   . Rectal cancer Neg Hx   . Stomach cancer Neg Hx     No Known Allergies  Current Outpatient Prescriptions on File Prior to Visit  Medication Sig Dispense Refill  . acetaminophen (TYLENOL) 500 MG tablet Take 1,000 mg by mouth every 4 (four) hours as needed for mild pain or fever.      . ALPRAZolam (XANAX) 1 MG tablet Take 1 mg by mouth 4 (four) times daily as needed for anxiety.       . Asenapine Maleate (SAPHRIS) 10 MG SUBL Place 20 mg under the tongue daily.      Marland Kitchen gabapentin (NEURONTIN) 300 MG capsule Take 300-600 mg by mouth 2 (two) times daily. Takes 300mg  in the morning and 600mg  in the evening      . ibuprofen (ADVIL,MOTRIN) 200 MG tablet Take 400 mg by mouth every 6 (six) hours as needed for moderate pain.      Marland Kitchen lamoTRIgine (LAMICTAL) 150 MG tablet Take 300 mg by mouth daily.       . Linaclotide (LINZESS) 145 MCG CAPS capsule Take 145 mcg by mouth daily as needed (for constipation).      Marland Kitchen omeprazole (PRILOSEC) 20 MG capsule  Take 20 mg by mouth daily.       . prochlorperazine (COMPAZINE) 10 MG tablet TAKE 1 TABLET BY MOUTH EVERY 6 HOURS AS NEEDED  30 tablet  2  . QUEtiapine (SEROQUEL) 100 MG tablet Take 100 mg by mouth daily.       Marland Kitchen zolpidem (AMBIEN) 10 MG tablet Take 10 mg by mouth at bedtime as needed for sleep.       . [DISCONTINUED] omeprazole (PRILOSEC OTC) 20 MG tablet 1 tablet twice daily. Do not eat for one hour after ingestion of the medication  60 tablet  11   No current facility-administered medications on file prior to visit.    BP 114/82  Temp(Src) 97.9 F (36.6 C) (Oral)  Ht 5\' 9"  (1.753 m)  Wt 199 lb (90.266 kg)  BMI 29.37 kg/m2      Review of Systems  Constitutional: Negative for fever, chills, appetite change and fatigue.  HENT: Negative for congestion, dental problem, ear pain, hearing loss, sore throat, tinnitus, trouble swallowing and voice change.   Eyes: Negative for pain, discharge and visual disturbance.    Respiratory: Negative for cough, chest tightness, wheezing and stridor.   Cardiovascular: Negative for chest pain, palpitations and leg swelling.  Gastrointestinal: Positive for nausea. Negative for vomiting, abdominal pain, diarrhea, constipation, blood in stool and abdominal distention.  Genitourinary: Negative for urgency, hematuria, flank pain, discharge, difficulty urinating and genital sores.  Musculoskeletal: Negative for arthralgias, back pain, gait problem, joint swelling, myalgias and neck stiffness.  Skin: Negative for rash.  Neurological: Negative for dizziness, syncope, speech difficulty, weakness, numbness and headaches.  Hematological: Negative for adenopathy. Does not bruise/bleed easily.  Psychiatric/Behavioral: Negative for behavioral problems and dysphoric mood. The patient is not nervous/anxious.        Objective:   Physical Exam  Constitutional: He is oriented to person, place, and time. He appears well-developed.  HENT:  Head: Normocephalic.  Right Ear: External ear normal.  Left Ear: External ear normal.  Eyes: Conjunctivae and EOM are normal.  Neck: Normal range of motion.  Cardiovascular: Normal rate and normal heart sounds.   Pulmonary/Chest: Breath sounds normal. He has no rales.  Abdominal: Soft. Bowel sounds are normal. He exhibits no distension. There is no tenderness. There is no rebound and no guarding.  Musculoskeletal: Normal range of motion. He exhibits no edema and no tenderness.  Neurological: He is alert and oriented to person, place, and time.  Psychiatric: He has a normal mood and affect. His behavior is normal.          Assessment & Plan:   Nausea.  We'll resume PPI therapy only short-term and placed on the entire reflux regimen.  Will treat nausea symptomatically Gastroparesis Chronic constipation Status post community-acquired pneumonia, resolved

## 2014-03-24 NOTE — Patient Instructions (Signed)
Avoids foods high in acid such as tomatoes citrus juices, and spicy foods.  Avoid eating within two hours of lying down or before exercising.  Do not overheat.  Try smaller more frequent meals.  If symptoms persist, elevate the head of her bed 12 inches while sleeping.

## 2014-04-12 ENCOUNTER — Other Ambulatory Visit: Payer: Self-pay | Admitting: Internal Medicine

## 2014-04-17 ENCOUNTER — Other Ambulatory Visit: Payer: Self-pay | Admitting: Internal Medicine

## 2014-05-14 ENCOUNTER — Encounter (HOSPITAL_COMMUNITY): Payer: Self-pay | Admitting: Emergency Medicine

## 2014-05-14 ENCOUNTER — Emergency Department (HOSPITAL_COMMUNITY): Payer: 59

## 2014-05-14 ENCOUNTER — Emergency Department (HOSPITAL_COMMUNITY)
Admission: EM | Admit: 2014-05-14 | Discharge: 2014-05-15 | Disposition: A | Discharge status: Home / Self Care | Payer: 59 | Attending: Emergency Medicine | Admitting: Emergency Medicine

## 2014-05-14 DIAGNOSIS — K5900 Constipation, unspecified: Secondary | ICD-10-CM | POA: Diagnosis present

## 2014-05-14 DIAGNOSIS — F17200 Nicotine dependence, unspecified, uncomplicated: Secondary | ICD-10-CM | POA: Insufficient documentation

## 2014-05-14 DIAGNOSIS — F411 Generalized anxiety disorder: Secondary | ICD-10-CM | POA: Insufficient documentation

## 2014-05-14 DIAGNOSIS — K219 Gastro-esophageal reflux disease without esophagitis: Secondary | ICD-10-CM | POA: Diagnosis not present

## 2014-05-14 DIAGNOSIS — F329 Major depressive disorder, single episode, unspecified: Secondary | ICD-10-CM | POA: Diagnosis not present

## 2014-05-14 DIAGNOSIS — Z79899 Other long term (current) drug therapy: Secondary | ICD-10-CM | POA: Insufficient documentation

## 2014-05-14 DIAGNOSIS — F3289 Other specified depressive episodes: Secondary | ICD-10-CM | POA: Insufficient documentation

## 2014-05-14 DIAGNOSIS — Z8739 Personal history of other diseases of the musculoskeletal system and connective tissue: Secondary | ICD-10-CM | POA: Diagnosis not present

## 2014-05-14 LAB — CBC WITH DIFFERENTIAL/PLATELET
BASOS PCT: 1 % (ref 0–1)
Basophils Absolute: 0.1 10*3/uL (ref 0.0–0.1)
EOS ABS: 0.3 10*3/uL (ref 0.0–0.7)
EOS PCT: 3 % (ref 0–5)
HEMATOCRIT: 44.6 % (ref 39.0–52.0)
HEMOGLOBIN: 15 g/dL (ref 13.0–17.0)
Lymphocytes Relative: 14 % (ref 12–46)
Lymphs Abs: 1.4 10*3/uL (ref 0.7–4.0)
MCH: 30.7 pg (ref 26.0–34.0)
MCHC: 33.6 g/dL (ref 30.0–36.0)
MCV: 91.2 fL (ref 78.0–100.0)
MONO ABS: 0.6 10*3/uL (ref 0.1–1.0)
MONOS PCT: 6 % (ref 3–12)
Neutro Abs: 7.7 10*3/uL (ref 1.7–7.7)
Neutrophils Relative %: 76 % (ref 43–77)
Platelets: 253 10*3/uL (ref 150–400)
RBC: 4.89 MIL/uL (ref 4.22–5.81)
RDW: 14.7 % (ref 11.5–15.5)
WBC: 10.1 10*3/uL (ref 4.0–10.5)

## 2014-05-14 NOTE — ED Notes (Addendum)
Pt states that he has hx of gastroparesis and has not been able to control the pain. Pt states he has been constipated since Friday. States he has given himself 2 enemas and taken Linzess.

## 2014-05-15 LAB — COMPREHENSIVE METABOLIC PANEL
ALK PHOS: 91 U/L (ref 39–117)
ALT: 23 U/L (ref 0–53)
ANION GAP: 14 (ref 5–15)
AST: 22 U/L (ref 0–37)
Albumin: 4.1 g/dL (ref 3.5–5.2)
BILIRUBIN TOTAL: 0.3 mg/dL (ref 0.3–1.2)
BUN: 8 mg/dL (ref 6–23)
CO2: 24 mEq/L (ref 19–32)
CREATININE: 1 mg/dL (ref 0.50–1.35)
Calcium: 9.3 mg/dL (ref 8.4–10.5)
Chloride: 108 mEq/L (ref 96–112)
GFR calc non Af Amer: 89 mL/min — ABNORMAL LOW (ref >90)
Glucose, Bld: 91 mg/dL (ref 70–99)
Potassium: 3.8 mEq/L (ref 3.7–5.3)
Sodium: 146 mEq/L (ref 137–147)
TOTAL PROTEIN: 7.4 g/dL (ref 6.0–8.3)

## 2014-05-15 LAB — LIPASE, BLOOD: Lipase: 31 U/L (ref 11–59)

## 2014-05-15 MED ORDER — MAGNESIUM CITRATE PO SOLN: 1.0000 | Freq: Once | ORAL | Status: DC

## 2014-05-15 NOTE — ED Provider Notes (Signed)
CSN: [id number]     Arrival date & time 05/14/14  2220 History   First MD Initiated Contact with Patient 05/14/14 2259     Chief Complaint  Patient presents with  . Abdominal Pain  . Constipation     (Consider location/radiation/quality/duration/timing/severity/associated sxs/prior Treatment) HPI Sean Wu is a 43 y.o. male with a past medical history of gastroparesis coming in with constipation for the past 3 days. Patient struggled with this for the past year. He took 2 of his Linzess at home and also CT enemas from his wife without relief. Patient describes having a sensation ago but only liquid coming out despite straining. He denies any abdominal pain. He has had nausea with no vomiting. He denies fevers chills or recent infections. He does have decreased urine output which is known to him for enlarged prostate. Patient is requesting a stronger enema medication.  10 Systems reviewed and are negative for acute change except as noted in the HPI.     Past Medical History  Diagnosis Date  . TESTICULAR HYPOFUNCTION 06/12/2009  . SMOKER 06/12/2009  . DEPRESSION 05/10/2009  . SLEEP APNEA, OBSTRUCTIVE 06/12/2009  . ALLERGIC RHINITIS 05/10/2009  . GERD 05/10/2009  . WEIGHT GAIN 05/10/2009  . LIBIDO, DECREASED 05/10/2009  . Osgood-Schlatter's disease   . Anxiety   . Chronic headaches   . Gastroparesis   . DDD (degenerative disc disease)    Past Surgical History  Procedure Laterality Date  . Lasik Bilateral   . Ankle surgery Left   . Colonoscopy  2014    normal   Family History  Problem Relation Age of Onset  . Heart disease Maternal Grandfather   . Irritable bowel syndrome Father   . Alcoholism Father   . Heart attack Father   . Colon cancer Neg Hx   . Rectal cancer Neg Hx   . Stomach cancer Neg Hx    History  Substance Use Topics  . Smoking status: Current Every Day Smoker -- 0.50 packs/day for 15 years    Types: Cigarettes  . Smokeless tobacco: Former Systems developer    Types:  [place] date: 09/01/1998     Comment: tobacco info given 08/01/13  . Alcohol Use: Yes     Comment: social    Review of Systems    Allergies  Review of patient's allergies indicates no known allergies.  Home Medications   Prior to Admission medications   Medication Sig Start Date End Date Taking? Authorizing Provider  ALPRAZolam Duanne Moron) 1 MG tablet Take 1 mg by mouth 4 (four) times daily as needed for anxiety.  12/08/11  Yes Historical Provider, MD  ammonium lactate (AMLACTIN) 12 % cream Apply 1 application topically daily as needed for dry skin.  01/05/14  Yes Historical Provider, MD  Asenapine Maleate (SAPHRIS) 10 MG SUBL Place 20 mg under the tongue daily.   Yes Historical Provider, MD  AXIRON 30 MG/ACT SOLN Apply 2 Act topically daily. 01/03/14  Yes Historical Provider, MD  diphenhydrAMINE (BENADRYL) 25 mg capsule Take 50 mg by mouth every 6 (six) hours as needed.   Yes Historical Provider, MD  gabapentin (NEURONTIN) 300 MG capsule Take 300-600 mg by mouth 2 (two) times daily. Takes 300mg  in the morning and 600mg  in the evening   Yes Historical Provider, MD  ibuprofen (ADVIL,MOTRIN) 200 MG tablet Take 400 mg by mouth every 6 (six) hours as needed for moderate pain.   Yes Historical Provider, MD  lamoTRIgine (LAMICTAL) 150 MG  tablet Take 300 mg by mouth daily.    Yes Historical Provider, MD  Linaclotide Rolan Lipa) 145 MCG CAPS capsule Take 145 mcg by mouth daily as needed (for constipation).   Yes Historical Provider, MD  omeprazole (PRILOSEC) 20 MG capsule Take 20 mg by mouth daily.  12/11/13  Yes Historical Provider, MD  polyethylene glycol powder (GLYCOLAX/MIRALAX) powder Take 1 g by mouth 2 (two) times daily. 02/20/14  Yes Historical Provider, MD  QUEtiapine (SEROQUEL) 100 MG tablet Take 100 mg by mouth daily.  12/15/13  Yes Historical Provider, MD  SUMAtriptan (IMITREX) 100 MG tablet Take 100 mg by mouth every 2 (two) hours as needed for migraine or headache. May repeat in 2 hours if  headache persists or recurs.   Yes Historical Provider, MD  zolpidem (AMBIEN) 10 MG tablet Take 10 mg by mouth at bedtime as needed for sleep.  12/26/13  Yes Historical Provider, MD   BP 134/100  Pulse 111  Temp(Src) 97.7 F (36.5 C) (Oral)  Resp 16  SpO2 100% Physical Exam  Nursing note and vitals reviewed. Constitutional: He is oriented to person, place, and time. Vital signs are normal. He appears well-developed and well-nourished.  Non-toxic appearance. He does not appear ill. No distress.  HENT:  Head: Normocephalic and atraumatic.  Nose: Nose normal.  Mouth/Throat: Oropharynx is clear and moist. No oropharyngeal exudate.  Eyes: Conjunctivae and EOM are normal. Pupils are equal, round, and reactive to light. No scleral icterus.  Neck: Normal range of motion. Neck supple. No tracheal deviation, no edema, no erythema and normal range of motion present. No mass and no thyromegaly present.  Cardiovascular: Normal rate, regular rhythm, S1 normal, S2 normal, normal heart sounds, intact distal pulses and normal pulses.  Exam reveals no gallop and no friction rub.   No murmur heard. Pulses:      Radial pulses are 2+ on the right side, and 2+ on the left side.       Dorsalis pedis pulses are 2+ on the right side, and 2+ on the left side.  Pulmonary/Chest: Effort normal and breath sounds normal. No respiratory distress. He has no wheezes. He has no rhonchi. He has no rales.  Abdominal: Soft. Normal appearance and bowel sounds are normal. He exhibits distension. He exhibits no ascites and no mass. There is no hepatosplenomegaly. There is no tenderness. There is no rebound, no guarding and no CVA tenderness.  Genitourinary:  Large amount of hard stool felt in rectal vault.  Musculoskeletal: Normal range of motion. He exhibits no edema and no tenderness.  Lymphadenopathy:    He has no cervical adenopathy.  Neurological: He is alert and oriented to person, place, and time. He has normal strength.  No cranial nerve deficit or sensory deficit. GCS eye subscore is 4. GCS verbal subscore is 5. GCS motor subscore is 6.  Skin: Skin is warm, dry and intact. No petechiae and no rash noted. He is not diaphoretic. No erythema. No pallor.  Psychiatric: He has a normal mood and affect. His behavior is normal. Judgment normal.    ED Course  Procedures (including critical care time) Labs Review Labs Reviewed  COMPREHENSIVE METABOLIC PANEL - Abnormal; Notable for the following:    GFR calc non Af Amer 89 (*)    All other components within normal limits  CBC WITH DIFFERENTIAL  LIPASE, BLOOD  URINALYSIS, ROUTINE W REFLEX MICROSCOPIC    Imaging Review Dg Abd 2 Views  05/15/2014   CLINICAL DATA:  Constipation  and abdominal pain.  EXAM: ABDOMEN - 2 VIEW  COMPARISON:  Lumbar spine radiographs, and CT of the abdomen and pelvis from 06/22/2013  FINDINGS: The visualized bowel gas pattern is unremarkable. Scattered air and fluid filled loops of colon are seen; no abnormal dilatation of small bowel loops is seen to suggest small bowel obstruction. No free intra-abdominal air is identified, though the hemidiaphragms are incompletely assessed on the upright view.  The visualized osseous structures are within normal limits; the sacroiliac joints are unremarkable in appearance.  IMPRESSION: Unremarkable bowel gas pattern; no free intra-abdominal air seen.   Electronically Signed   By: Garald Balding M.D.   On: 05/15/2014 00:59     EKG Interpretation None      MDM   Final diagnoses:  None    Patient presents to emergency department out of concern for constipation. Was able to do bedside disimpaction, and got out a lot of stool. Patient was educated on magnesium citrate, and prefers to take this and the comfort of his home. We'll discharge the prescription and close followup with his primary care physician. Patient's most recent heart rate was in the 80s as well as in the room.  This signs remain within his  normal limits and is safe for discharge. X-ray as above did not show any small bowel obstruction.    Everlene Balls, MD 05/15/14 0110

## 2014-05-15 NOTE — Discharge Instructions (Signed)
Constipation Sean Wu, you were seen today for constipation.  You were disimpacted and should have easier bowel movements.  Be sure to adhere to your diet.  Take magnesium citrate at home as needed and follow up with your regular doctor within 3 days. If your symptoms worsen, return to the ED immediately.  Thank you. Constipation is when a person:  Poops (has a bowel movement) less than 3 times a week.  Has a hard time pooping.  Has poop that is dry, hard, or bigger than normal. HOME CARE   Eat foods with a lot of fiber in them. This includes fruits, vegetables, beans, and whole grains such as brown rice.  Avoid fatty foods and foods with a lot of sugar. This includes french fries, hamburgers, cookies, candy, and soda.  If you are not getting enough fiber from food, take products with added fiber in them (supplements).  Drink enough fluid to keep your pee (urine) clear or pale yellow.  Exercise on a regular basis, or as told by your doctor.  Go to the restroom when you feel like you need to poop. Do not hold it.  Only take medicine as told by your doctor. Do not take medicines that help you poop (laxatives) without talking to your doctor first. GET HELP RIGHT AWAY IF:   You have bright red blood in your poop (stool).  Your constipation lasts more than 4 days or gets worse.  You have belly (abdominal) or butt (rectal) pain.  You have thin poop (as thin as a pencil).  You lose weight, and it cannot be explained. MAKE SURE YOU:   Understand these instructions.  Will watch your condition.  Will get help right away if you are not doing well or get worse. Document Released: 02/04/2008 Document Revised: 08/23/2013 Document Reviewed: 05/30/2013 [facility] Patient Information 2015 [place], [place]. This information is not intended to replace advice given to you by your health care provider. Make sure you discuss any questions you have with your health care provider.

## 2014-05-16 ENCOUNTER — Other Ambulatory Visit: Payer: Self-pay | Admitting: Gastroenterology

## 2014-05-17 ENCOUNTER — Other Ambulatory Visit: Payer: Self-pay | Admitting: Gastroenterology

## 2014-06-30 ENCOUNTER — Other Ambulatory Visit: Payer: Self-pay | Admitting: Internal Medicine

## 2014-07-08 ENCOUNTER — Other Ambulatory Visit: Payer: Self-pay | Admitting: Neurology

## 2014-07-20 ENCOUNTER — Other Ambulatory Visit: Payer: Self-pay | Admitting: Internal Medicine

## 2014-07-24 ENCOUNTER — Other Ambulatory Visit: Payer: Self-pay | Admitting: Internal Medicine

## 2014-08-01 ENCOUNTER — Other Ambulatory Visit: Payer: Self-pay | Admitting: Internal Medicine

## 2014-08-09 ENCOUNTER — Ambulatory Visit (INDEPENDENT_AMBULATORY_CARE_PROVIDER_SITE_OTHER)
Admission: RE | Admit: 2014-08-09 | Discharge: 2014-08-09 | Disposition: A | Discharge status: Home / Self Care | Payer: 59 | Source: Ambulatory Visit | Attending: Family Medicine | Admitting: Family Medicine

## 2014-08-09 ENCOUNTER — Encounter: Payer: Self-pay | Admitting: Family Medicine

## 2014-08-09 ENCOUNTER — Ambulatory Visit (INDEPENDENT_AMBULATORY_CARE_PROVIDER_SITE_OTHER): Payer: 59 | Admitting: Family Medicine

## 2014-08-09 VITALS — BP 112/82 | HR 110 | Temp 97.3°F | Wt 205.0 lb

## 2014-08-09 DIAGNOSIS — R519 Headache, unspecified: Secondary | ICD-10-CM

## 2014-08-09 DIAGNOSIS — R51 Headache: Secondary | ICD-10-CM

## 2014-08-09 MED ORDER — ONDANSETRON 8 MG PO TBDP: 8.0000 mg | ORAL_TABLET | Freq: Three times a day (TID) | ORAL | Status: DC | PRN

## 2014-08-09 MED ORDER — PREDNISONE 10 MG PO TABS: ORAL_TABLET | ORAL | Status: DC

## 2014-08-09 NOTE — Progress Notes (Signed)
Pre visit review using our clinic review tool, if applicable. No additional management support is needed unless otherwise documented below in the visit note. 

## 2014-08-09 NOTE — Progress Notes (Signed)
   Subjective:    Patient ID: Sean Wu, male    DOB: April 03, 1971, 43 y.o.   MRN: [id number]  HPI   Patient is seen for evaluation of subacute headache. Onset about 3 weeks. He has history reported migraines states is somewhat different. He has fairly diffuse headache which is both frontal and occipital and very somnolent location. Dull quality. Unrelenting. Progressive over the past 3 weeks. He's had frequent severe nausea but no vomiting. He's taken Emetrol some relief. He also notices light sensitivity. He's had some associated vertigo. Denies any fever or chills. No sinusitis symptoms. No focal weakness. Denies any appetite or weight changes.  His tried Excedrin Migraine and Advil without relief. Denies any recent injuries. Initially taken Imitrex which helped but only briefly. He's never had status migraine or anything approaching 3 weeks.  Past Medical History  Diagnosis Date  . TESTICULAR HYPOFUNCTION 06/12/2009  . SMOKER 06/12/2009  . DEPRESSION 05/10/2009  . SLEEP APNEA, OBSTRUCTIVE 06/12/2009  . ALLERGIC RHINITIS 05/10/2009  . GERD 05/10/2009  . WEIGHT GAIN 05/10/2009  . LIBIDO, DECREASED 05/10/2009  . Osgood-Schlatter's disease   . Anxiety   . Chronic headaches   . Gastroparesis   . DDD (degenerative disc disease)    Past Surgical History  Procedure Laterality Date  . Lasik Bilateral   . Ankle surgery Left   . Colonoscopy  2014    normal    reports that he has been smoking Cigarettes.  He has a 7.5 pack-year smoking history. He quit smokeless tobacco use about 15 years ago. His smokeless tobacco use included Chew. He reports that he drinks alcohol. He reports that he does not use illicit drugs. family history includes Alcoholism in his father; Heart attack in his father; Heart disease in his maternal grandfather; Irritable bowel syndrome in his father. There is no history of Colon cancer, Rectal cancer, or Stomach cancer. No Known Allergies    Review of Systems    Constitutional: Negative for fever, chills, appetite change and unexpected weight change.  HENT: Negative for congestion and sinus pressure.   Cardiovascular: Negative for chest pain.  Neurological: Positive for dizziness and headaches. Negative for seizures, syncope and weakness.  Hematological: Negative for adenopathy.  Psychiatric/Behavioral: Negative for confusion.       Objective:   Physical Exam  Constitutional: He is oriented to person, place, and time. He appears well-developed and well-nourished.  Eyes: Pupils are equal, round, and reactive to light.  Neck: Neck supple.  Cardiovascular: Normal rate and regular rhythm.  Exam reveals no gallop.   No murmur heard. Pulmonary/Chest: Effort normal and breath sounds normal. No respiratory distress. He has no wheezes. He has no rales.  Neurological: He is alert and oriented to person, place, and time. He has normal reflexes. No cranial nerve deficit.  Psychiatric: He has a normal mood and affect. His behavior is normal. Judgment and thought content normal.          Assessment & Plan:  Atypical headache. Possible this represents status migraine but symptoms are not typical. He has no localizing pain but has had some progressive headache over 3 weeks with associated nausea and vertigo. This would not be typical for episodic or chronic tension-type headache. Given atypical factors as above and no relief with conservative therapy obtain CT head without contrast. If normal consider prednisone taper. Zofran 8 mg every 8 hours as needed for nausea and vomiting

## 2014-08-09 NOTE — Patient Instructions (Signed)

## 2014-08-23 ENCOUNTER — Telehealth: Payer: Self-pay

## 2014-08-23 NOTE — Telephone Encounter (Signed)
Pt has an appt for 12.24.15

## 2014-08-23 NOTE — Telephone Encounter (Signed)
[facility] - Client [facility] Patient Name: Sean Wu Gender: Male DOB: May 07, 1971 Age: 43 Y 64 M 15 D Return Phone Number: [phone] (Primary) Address: [address] [place]/State/Zip: [place] [address] Client [place] [facility] - Client Client Site [facility] - Day Physician Sean Wu Contact Type Call Call Type Triage / Clinical Relationship To Patient Self Return Phone Number [phone] (Primary) Chief Complaint Nausea Initial Comment [name] states that he was seen a few weeks ago for migraine and nausea. Migraine has gone away but still have the nausea. PreDisposition Home Care Nurse Assessment Nurse: Sean Furlong, RN, Sean Wu Date/Time Sean Wu Time): 08/23/2014 10:24:33 AM Confirm and document reason for call. If symptomatic, describe symptoms. ---[name] states that he was seen a few weeks ago for migraine and nausea. Migraine has gone away but still have the nausea. Has the patient traveled out of the country within the last 30 days? ---No Does the patient require triage? ---Yes Related visit to physician within the last 2 weeks? ---Yes Does the PT have any chronic conditions? (i.e. diabetes, asthma, etc.) ---Yes List chronic conditions. ---migraines Guidelines Guideline Title Affirmed Question Affirmed Notes Nurse Date/Time (Eastern Time) Nausea Nausea is a chronic symptom (recurrent or ongoing AND present > 4 weeks) Sean Furlong, RN, Sean Wu 08/23/2014 10:26:06 AM Disp. Time Sean Wu Time) Disposition Final User 08/23/2014 10:19:30 AM Send To Clinical Follow Up Sean Wu 08/23/2014 10:34:14 AM See PCP within 2 Weeks Yes Sean Furlong, RN, Sean Wu [name] Understands: Yes Disagree/Comply: Comply PLEASE NOTE: All timestamps contained within this report are represented as [place] Standard Time. CONFIDENTIALTY NOTICE: This fax transmission  is intended only for the addressee. It contains information that is legally privileged, confidential or otherwise protected from use or disclosure. If you are not the intended recipient, you are strictly prohibited from reviewing, disclosing, copying using or disseminating any of this information or taking any action in reliance on or regarding this information. If you have received this fax in error, please notify us immediately by telephone so that we can arrange for its return to [place]. Phone: [phone], Toll-Free: [phone], Fax: [phone] Page: 2 of 2 Call Id: [id number] Care Advice Given Per Guideline SEE PCP WITHIN 2 WEEKS: You need an evaluation for this ongoing problem within the next 2 weeks. Call your doctor during regular office hours and make an appointment. CLEAR FLUIDS - Take clear fluids in small amounts until the nausea is resolved for 8 hours: * Sip water or rehydration liquid (Gatorade or Powerade) * Other options: 1/2 strength flat lemon-lime soda or ginger ale SOLIDS: Gradually return to a normal diet. Start with saltine crackers, white bread, rice, mashed potatoes, cereal, apple sauce etc. AVOID MEDS: * Stop taking all non-prescription medicines. (Reason: may make nausea worse.) * Avoid NSAIDs, which can cause gastritis * Call if vomiting a prescription medicine. CALL BACK IF: * You become worse. CARE ADVICE given per Nausea (Adult) guideline. After Care Instructions Given Call Event Type User Date / Time Description Comments User: Sean Alma, RN Date/Time Sean Wu Time): 08/23/2014 10:37:58 AM pt concedes during call that he also has chronic Gastroparesis, which may be the reason for his nausea User: Sean Alma, RN Date/Time Sean Wu Time): 08/23/2014 10:40:15 AM P wishest to discuss at next PCP appt w/i the next 2 weeks, additional potential medicine that may help with his nausea, as per the pt, as this is a chronic condition

## 2014-08-24 ENCOUNTER — Ambulatory Visit: Payer: 59 | Admitting: Family Medicine

## 2014-09-07 ENCOUNTER — Telehealth: Payer: Self-pay | Admitting: Internal Medicine

## 2014-09-07 ENCOUNTER — Other Ambulatory Visit: Payer: Self-pay | Admitting: Gastroenterology

## 2014-09-07 NOTE — Telephone Encounter (Signed)
Opened in error

## 2014-09-12 ENCOUNTER — Other Ambulatory Visit: Payer: Self-pay | Admitting: Family Medicine

## 2014-09-14 ENCOUNTER — Ambulatory Visit (INDEPENDENT_AMBULATORY_CARE_PROVIDER_SITE_OTHER): Payer: 59 | Admitting: Internal Medicine

## 2014-09-14 ENCOUNTER — Encounter: Payer: Self-pay | Admitting: *Deleted

## 2014-09-14 ENCOUNTER — Encounter: Payer: Self-pay | Admitting: Internal Medicine

## 2014-09-14 VITALS — BP 140/90 | HR 92 | Temp 97.9°F | Resp 20 | Ht 69.0 in | Wt 209.0 lb

## 2014-09-14 DIAGNOSIS — R110 Nausea: Secondary | ICD-10-CM

## 2014-09-14 DIAGNOSIS — K219 Gastro-esophageal reflux disease without esophagitis: Secondary | ICD-10-CM

## 2014-09-14 DIAGNOSIS — K3184 Gastroparesis: Secondary | ICD-10-CM

## 2014-09-14 IMAGING — CR DG LUMBAR SPINE COMPLETE 4+V
5 series · 5 of 5 positions shown · non-contrast
Comparison: None.

CLINICAL DATA: Low back pain

EXAM:
LUMBAR SPINE - COMPLETE 4+ VIEW

[t lumbar spine ap]
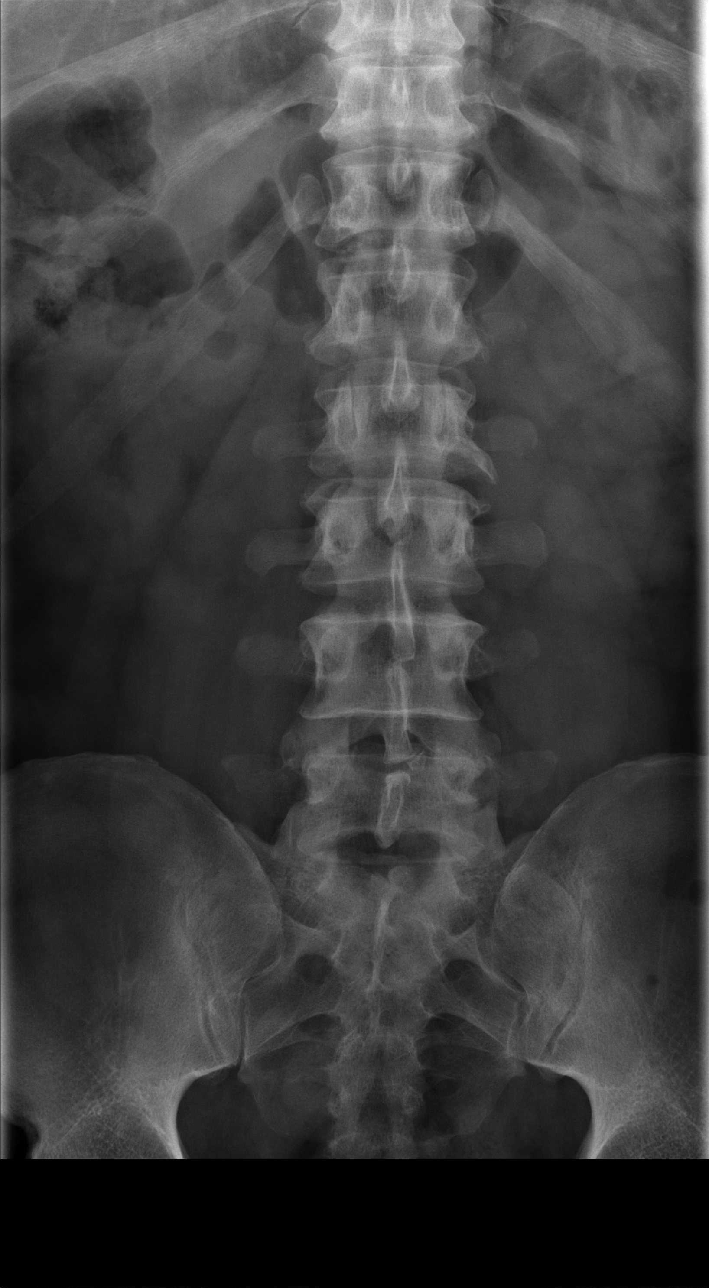

[t lumbar spine obl (1 of 2)]
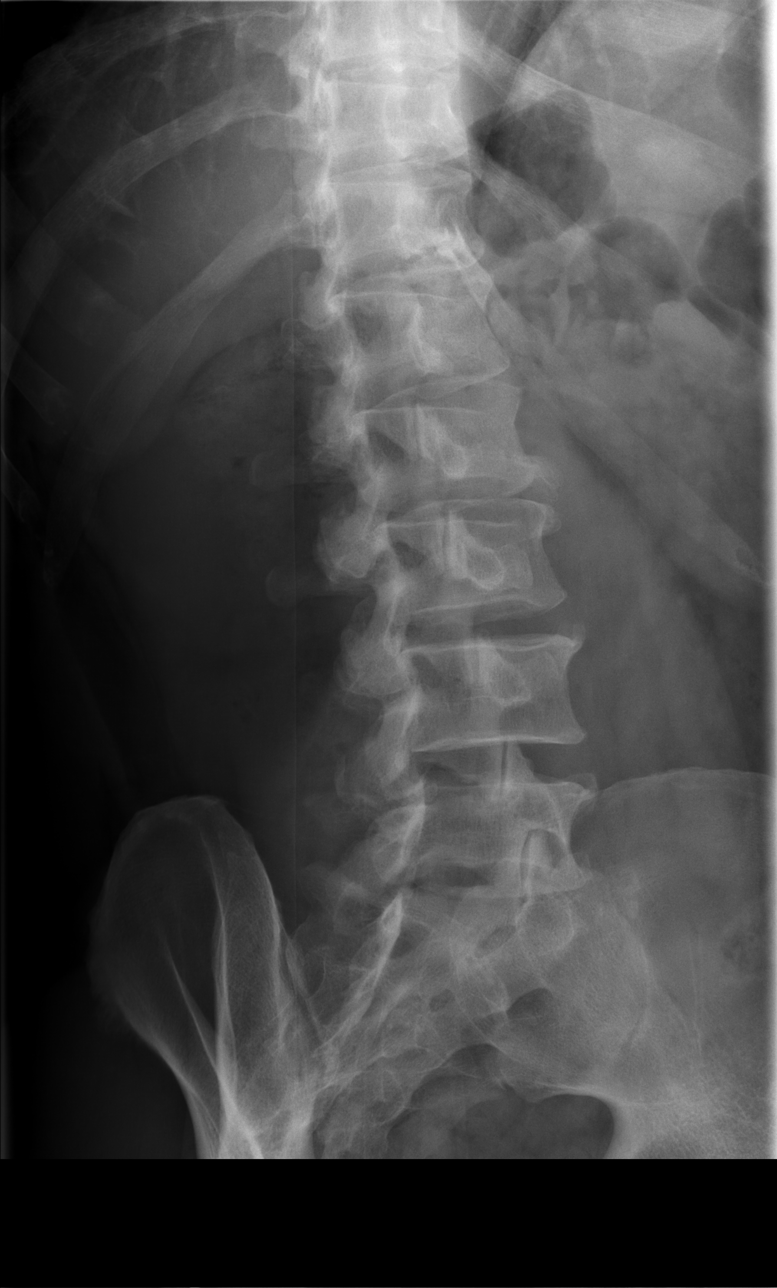

[t lumbar spine obl (2 of 2)]
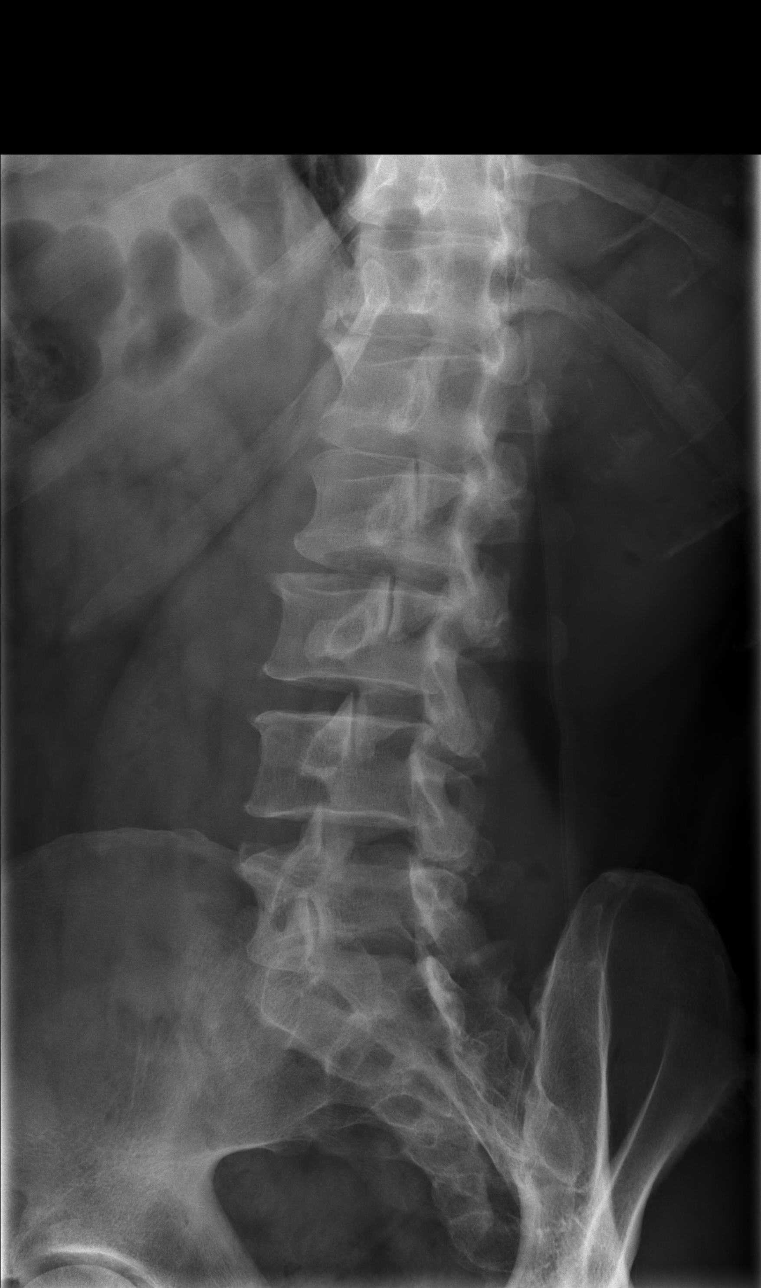

[t lumbar spine lat]
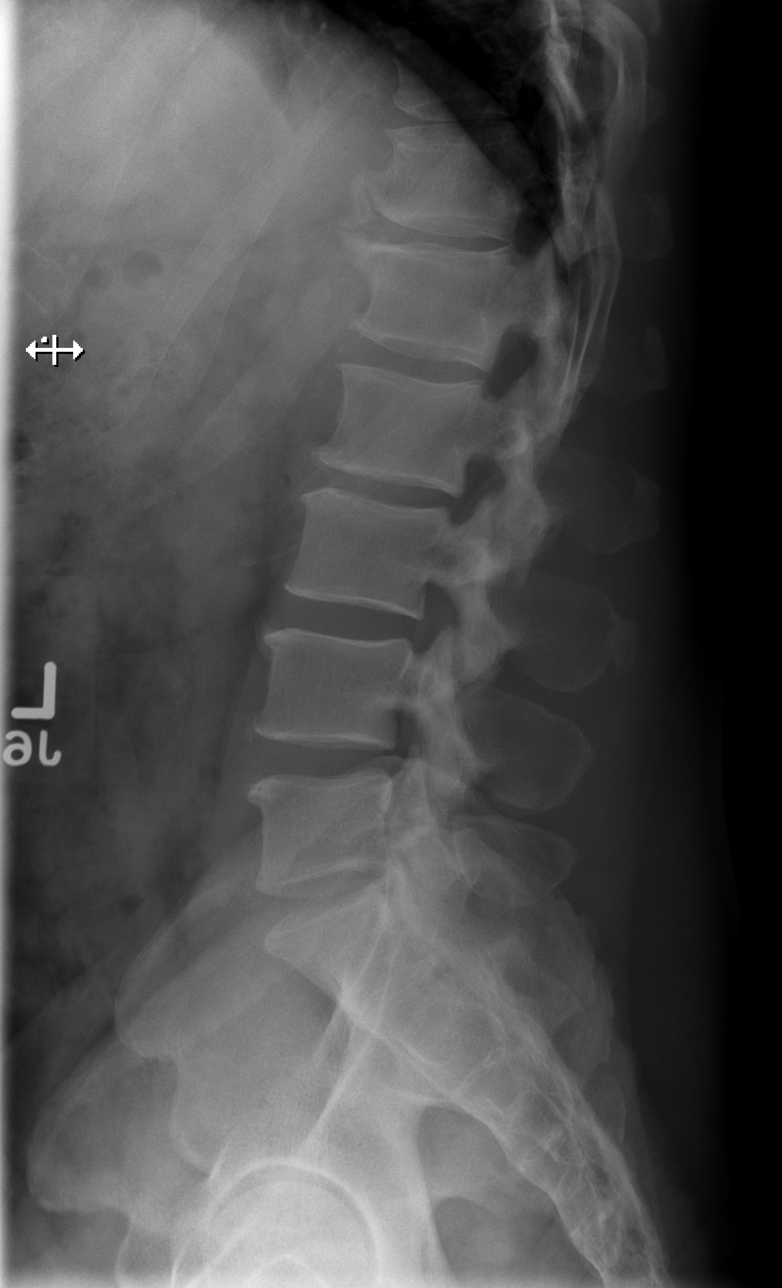

[t lumbar l-5 s-1 spot]
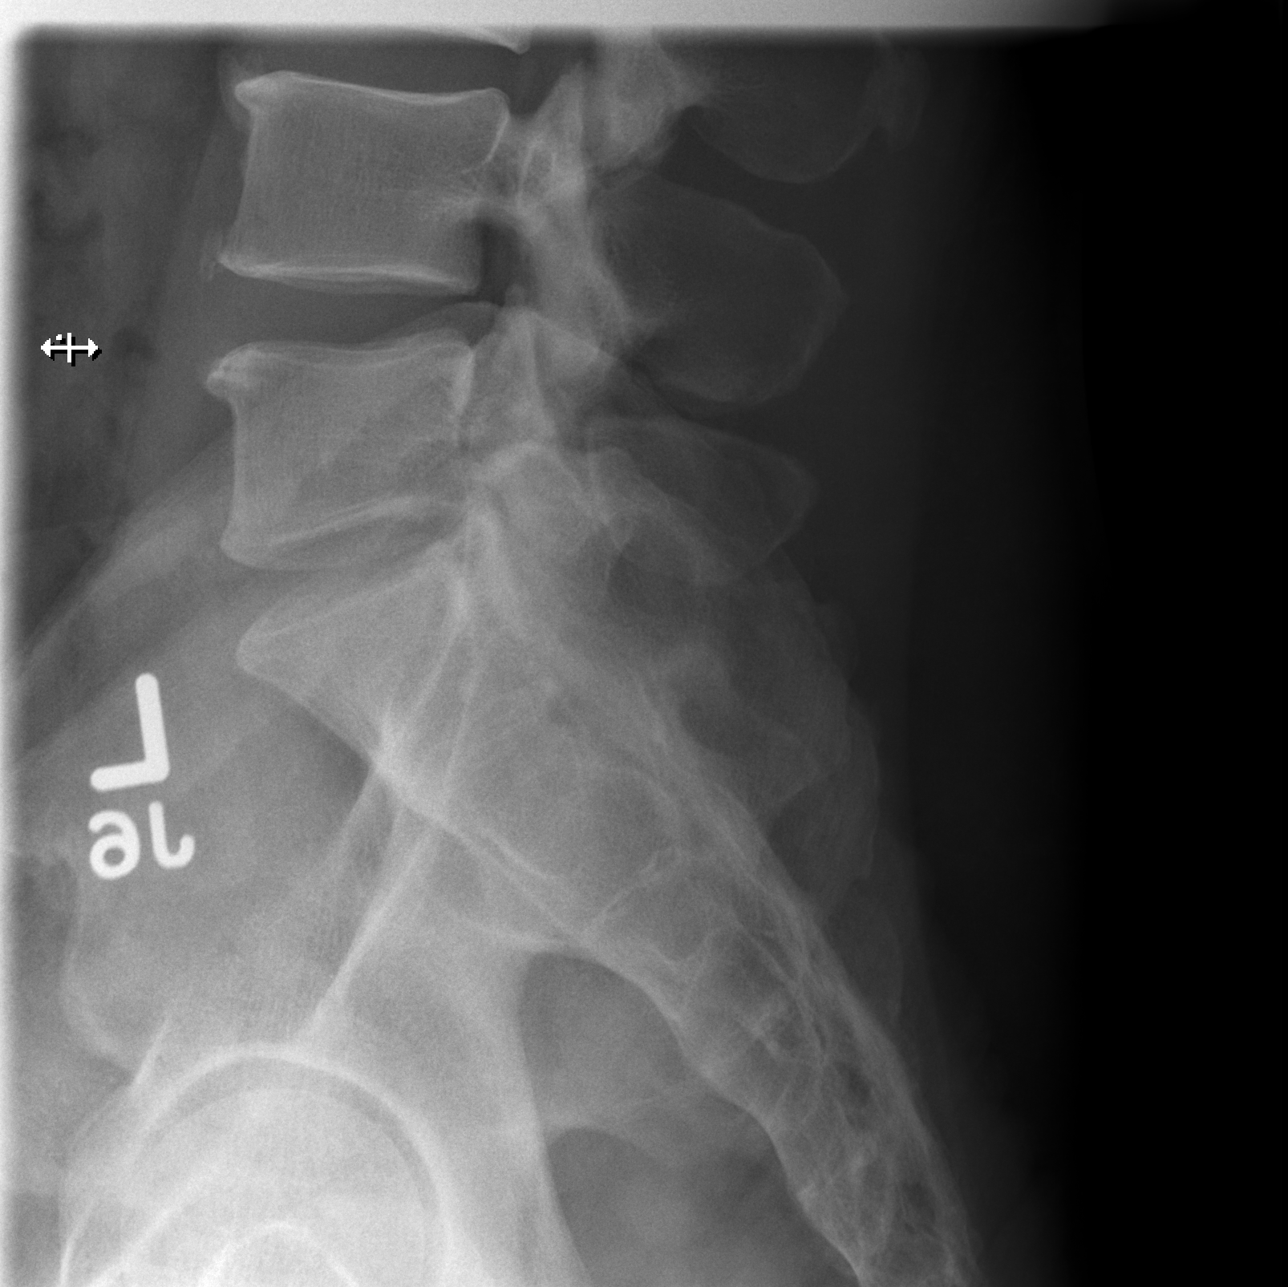

[5 of 5 positions shown; findings below may reference images not displayed]

FINDINGS: Vertebral body height is well maintained. Mild osteophytic changes
are seen. No spondylolysis or spondylolisthesis is seen.
IMPRESSION: Mild degenerative change without acute abnormality.

## 2014-09-14 MED ORDER — ONDANSETRON 8 MG PO TBDP: 8.0000 mg | ORAL_TABLET | Freq: Three times a day (TID) | ORAL | Status: DC | PRN

## 2014-09-14 NOTE — Patient Instructions (Signed)
Food Choices for Gastroesophageal Reflux Disease When you have gastroesophageal reflux disease (GERD), the foods you eat and your eating habits are very important. Choosing the right foods can help ease the discomfort of GERD. WHAT GENERAL GUIDELINES DO I NEED TO FOLLOW?  Choose fruits, vegetables, whole grains, low-fat dairy products, and low-fat meat, fish, and poultry.  Limit fats such as oils, salad dressings, butter, nuts, and avocado.  Keep a food diary to identify foods that cause symptoms.  Avoid foods that cause reflux. These may be different for different people.  Eat frequent small meals instead of three large meals each day.  Eat your meals slowly, in a relaxed setting.  Limit fried foods.  Cook foods using methods other than frying.  Avoid drinking alcohol.  Avoid drinking large amounts of liquids with your meals.  Avoid bending over or lying down until 2-3 hours after eating. WHAT FOODS ARE NOT RECOMMENDED? The following are some foods and drinks that may worsen your symptoms: Vegetables Tomatoes. Tomato juice. Tomato and spaghetti sauce. Chili peppers. Onion and garlic. Horseradish. Fruits Oranges, grapefruit, and lemon (fruit and juice). Meats High-fat meats, fish, and poultry. This includes hot dogs, ribs, ham, sausage, salami, and bacon. Dairy Whole milk and chocolate milk. Sour cream. Cream. Butter. Ice cream. Cream cheese.  Beverages Coffee and tea, with or without caffeine. Carbonated beverages or energy drinks. Condiments Hot sauce. Barbecue sauce.  Sweets/Desserts Chocolate and cocoa. Donuts. Peppermint and spearmint. Fats and Oils High-fat foods, including [place] fries and potato chips. Other Vinegar. Strong spices, such as black pepper, white pepper, red pepper, cayenne, curry powder, cloves, ginger, and chili powder. The items listed above may not be a complete list of foods and beverages to avoid. Contact your dietitian for more  information. Document Released: 08/18/2005 Document Revised: 08/23/2013 Document Reviewed: 06/22/2013 [facility] Patient Information 2015 [place], [place]. This information is not intended to replace advice given to you by your health care provider. Make sure you discuss any questions you have with your health care provider. High-Fiber Diet Fiber is found in fruits, vegetables, and grains. A high-fiber diet encourages the addition of more whole grains, legumes, fruits, and vegetables in your diet. The recommended amount of fiber for adult males is 38 g per day. For adult females, it is 25 g per day. Pregnant and lactating women should get 28 g of fiber per day. If you have a digestive or bowel problem, ask your caregiver for advice before adding high-fiber foods to your diet. Eat a variety of high-fiber foods instead of only a select few type of foods.  PURPOSE  To increase stool bulk.  To make bowel movements more regular to prevent constipation.  To lower cholesterol.  To prevent overeating. WHEN IS THIS DIET USED?  It may be used if you have constipation and hemorrhoids.  It may be used if you have uncomplicated diverticulosis (intestine condition) and irritable bowel syndrome.  It may be used if you need help with weight management.  It may be used if you want to add it to your diet as a protective measure against atherosclerosis, diabetes, and cancer. SOURCES OF FIBER  Whole-grain breads and cereals.  Fruits, such as apples, oranges, bananas, berries, prunes, and pears.  Vegetables, such as green peas, carrots, sweet potatoes, beets, broccoli, cabbage, spinach, and artichokes.  Legumes, such split peas, soy, lentils.  Almonds. FIBER CONTENT IN FOODS Starches and Grains / Dietary Fiber (g)  Cheerios, 1 cup / 3 g  Corn Flakes  cereal, 1 cup / 0.7 g  Rice crispy treat cereal, 1 cup / 0.3 g  Instant oatmeal (cooked),  cup / 2 g  Frosted wheat cereal, 1 cup / 5.1  g  Brown, long-grain rice (cooked), 1 cup / 3.5 g  White, long-grain rice (cooked), 1 cup / 0.6 g  Enriched macaroni (cooked), 1 cup / 2.5 g Legumes / Dietary Fiber (g)  Baked beans (canned, plain, or vegetarian),  cup / 5.2 g  Kidney beans (canned),  cup / 6.8 g  Pinto beans (cooked),  cup / 5.5 g Breads and Crackers / Dietary Fiber (g)  Plain or honey graham crackers, 2 squares / 0.7 g  Saltine crackers, 3 squares / 0.3 g  Plain, salted pretzels, 10 pieces / 1.8 g  Whole-wheat bread, 1 slice / 1.9 g  White bread, 1 slice / 0.7 g  Raisin bread, 1 slice / 1.2 g  Plain bagel, 3 oz / 2 g  Flour tortilla, 1 oz / 0.9 g  Corn tortilla, 1 small / 1.5 g  Hamburger or hotdog bun, 1 small / 0.9 g Fruits / Dietary Fiber (g)  Apple with skin, 1 medium / 4.4 g  Sweetened applesauce,  cup / 1.5 g  Banana,  medium / 1.5 g  Grapes, 10 grapes / 0.4 g  Orange, 1 small / 2.3 g  Raisin, 1.5 oz / 1.6 g  Melon, 1 cup / 1.4 g Vegetables / Dietary Fiber (g)  Green beans (canned),  cup / 1.3 g  Carrots (cooked),  cup / 2.3 g  Broccoli (cooked),  cup / 2.8 g  Peas (cooked),  cup / 4.4 g  Mashed potatoes,  cup / 1.6 g  Lettuce, 1 cup / 0.5 g  Corn (canned),  cup / 1.6 g  Tomato,  cup / 1.1 g Document Released: 08/18/2005 Document Revised: 02/17/2012 Document Reviewed: 11/20/2011 [employer] Patient Information 2015 [place], [place]. This information is not intended to replace advice given to you by your health care provider. Make sure you discuss any questions you have with your health care provider.

## 2014-09-14 NOTE — Progress Notes (Signed)
Pre visit review using our clinic review tool, if applicable. No additional management support is needed unless otherwise documented below in the visit note. 

## 2014-09-14 NOTE — Progress Notes (Signed)
Subjective:    Patient ID: Sean Wu, male    DOB: [birth year], 44 y.o.   MRN: [id number]  HPI  44 year old patient who has a history of gastroparesis as well as chronic constipation.  He presents with a 3-4 week history of nausea without vomiting.  He does have 2 watery bowel movements per day.  No abdominal pain.  He has had some benefit with Zofran.  Presently not on any narcotics.  He was seen recently for intractable headaches which have improved.  Past Medical History  Diagnosis Date  . TESTICULAR HYPOFUNCTION 06/12/2009  . SMOKER 06/12/2009  . DEPRESSION 05/10/2009  . SLEEP APNEA, OBSTRUCTIVE 06/12/2009  . ALLERGIC RHINITIS 05/10/2009  . GERD 05/10/2009  . WEIGHT GAIN 05/10/2009  . LIBIDO, DECREASED 05/10/2009  . Osgood-Schlatter's disease   . Anxiety   . Chronic headaches   . Gastroparesis   . DDD (degenerative disc disease)     History   Social History  . Marital Status: Married    Spouse Name: N/A    Number of Children: 0  . Years of Education: N/A   Occupational History  . sales    Social History Main Topics  . Smoking status: Current Every Day Smoker -- 0.50 packs/day for 15 years    Types: Cigarettes  . Smokeless tobacco: Former Systems developer    Types: [place] date: 09/01/1998     Comment: tobacco info given 08/01/13  . Alcohol Use: Yes     Comment: social  . Drug Use: No  . Sexual Activity: Not on file   Other Topics Concern  . Not on file   Social History Narrative    Past Surgical History  Procedure Laterality Date  . Lasik Bilateral   . Ankle surgery Left   . Colonoscopy  2014    normal    Family History  Problem Relation Age of Onset  . Heart disease Maternal Grandfather   . Irritable bowel syndrome Father   . Alcoholism Father   . Heart attack Father   . Colon cancer Neg Hx   . Rectal cancer Neg Hx   . Stomach cancer Neg Hx     No Known Allergies  Current Outpatient Prescriptions on File Prior to Visit  Medication Sig Dispense  Refill  . ALPRAZolam (XANAX) 1 MG tablet Take 1 mg by mouth 4 (four) times daily as needed for anxiety.     Marland Kitchen ammonium lactate (AMLACTIN) 12 % cream Apply 1 application topically daily as needed for dry skin.     . Asenapine Maleate (SAPHRIS) 10 MG SUBL Place 20 mg under the tongue daily.    . diphenhydrAMINE (BENADRYL) 25 mg capsule Take 50 mg by mouth every 6 (six) hours as needed.    Marland Kitchen ibuprofen (ADVIL,MOTRIN) 200 MG tablet Take 400 mg by mouth every 6 (six) hours as needed for moderate pain.    Marland Kitchen lamoTRIgine (LAMICTAL) 150 MG tablet Take 300 mg by mouth daily.     Marland Kitchen LINZESS 145 MCG CAPS capsule TAKE 1 CAPSULE EVERY DAY 30 capsule 0  . omeprazole (PRILOSEC) 20 MG capsule Take 20 mg by mouth daily.     . ondansetron (ZOFRAN-ODT) 8 MG disintegrating tablet DISSOLVE 1 TABLET EVERY 8 HOURS AS NEEDED FOR NAUSEA OR VOMITING 15 tablet 0  . polyethylene glycol powder (GLYCOLAX/MIRALAX) powder Take 1 g by mouth 2 (two) times daily.    . SUMAtriptan (IMITREX) 100 MG tablet TAKE AS DIRECTED MAY REPEAT  IN 2 HOURS IF HEADACHE PERSISTS OR RECURS. MAXIMUM DOSE 2 TABLETS IN 24 6 tablet 2  . Testosterone (AXIRON) 30 MG/ACT SOLN APPLY 2 ACTUATIONS AND APPLY EVERY MORNING 90 mL 2  . zolpidem (AMBIEN) 10 MG tablet Take 10 mg by mouth at bedtime as needed for sleep.     . [DISCONTINUED] omeprazole (PRILOSEC OTC) 20 MG tablet 1 tablet twice daily. Do not eat for one hour after ingestion of the medication 60 tablet 11   No current facility-administered medications on file prior to visit.    BP 140/90 mmHg  Pulse 92  Temp(Src) 97.9 F (36.6 C) (Oral)  Resp 20  Ht 5\' 9"  (1.753 m)  Wt 209 lb (94.802 kg)  BMI 30.85 kg/m2  SpO2 96%      Review of Systems  Constitutional: Negative for fever, chills, appetite change and fatigue.  HENT: Negative for congestion, dental problem, ear pain, hearing loss, sore throat, tinnitus, trouble swallowing and voice change.   Eyes: Negative for pain, discharge and visual  disturbance.  Respiratory: Negative for cough, chest tightness, wheezing and stridor.   Cardiovascular: Negative for chest pain, palpitations and leg swelling.  Gastrointestinal: Positive for nausea. Negative for vomiting, abdominal pain, diarrhea, constipation, blood in stool and abdominal distention.  Genitourinary: Negative for urgency, hematuria, flank pain, discharge, difficulty urinating and genital sores.  Musculoskeletal: Negative for myalgias, back pain, joint swelling, arthralgias, gait problem and neck stiffness.  Skin: Negative for rash.  Neurological: Negative for dizziness, syncope, speech difficulty, weakness, numbness and headaches.  Hematological: Negative for adenopathy. Does not bruise/bleed easily.  Psychiatric/Behavioral: Negative for behavioral problems and dysphoric mood. The patient is not nervous/anxious.        Objective:   Physical Exam  Constitutional: He is oriented to person, place, and time. He appears well-developed.  HENT:  Head: Normocephalic.  Right Ear: External ear normal.  Left Ear: External ear normal.  Eyes: Conjunctivae and EOM are normal.  Neck: Normal range of motion.  Cardiovascular: Normal rate and normal heart sounds.   Pulmonary/Chest: Effort normal and breath sounds normal. No respiratory distress. He has no wheezes. He has no rales.  Abdominal: Soft. Bowel sounds are normal. He exhibits no distension. There is no tenderness. There is no rebound and no guarding.  Musculoskeletal: Normal range of motion. He exhibits no edema or tenderness.  Neurological: He is alert and oriented to person, place, and time.  Psychiatric: He has a normal mood and affect. His behavior is normal.          Assessment & Plan:   Chronic constipation Gastroparesis History recurrent nausea  Will place on aggressive antireflux regimen and treat constipation aggressively We'll hold off on metoclopramide in view of present.  Neuroleptic use Refer back to GI  if unimproved High-fiber diet.  Encouraged

## 2014-09-19 ENCOUNTER — Other Ambulatory Visit: Payer: Self-pay | Admitting: Internal Medicine

## 2014-09-20 ENCOUNTER — Other Ambulatory Visit: Payer: Self-pay | Admitting: Internal Medicine

## 2014-09-29 ENCOUNTER — Telehealth: Payer: Self-pay | Admitting: Gastroenterology

## 2014-09-29 ENCOUNTER — Other Ambulatory Visit: Payer: Self-pay | Admitting: Internal Medicine

## 2014-09-29 NOTE — Telephone Encounter (Signed)
Pt states he has been having nausea everyday for about 6 mths. Pt would like to be seen, states he has seen his PCP but has not gotten any relief. Pt scheduled to see Amy Esterwood PA 10/02/14@1 :30pm. Pt aware of appt.

## 2014-10-02 ENCOUNTER — Ambulatory Visit: Payer: 59 | Admitting: Physician Assistant

## 2014-10-03 ENCOUNTER — Encounter: Payer: Self-pay | Admitting: *Deleted

## 2014-10-03 NOTE — Progress Notes (Signed)
Patient ID: Sean Wu, male   DOB: [birth year], 44 y.o.   MRN: [id number] This patient was scheduled for 10-02-2014 with Amy Esterwood PA-C.  He was to be seen for nausea.He did not show for his appointment. He did not call in yesterday on 10-02-14 to cancel or reschedule. I asked Amy Esterwood PA if I should call him and she said no. The patient can call us to make an another appointmentif he is having problems.

## 2014-10-06 ENCOUNTER — Other Ambulatory Visit: Payer: Self-pay | Admitting: Gastroenterology

## 2014-10-10 ENCOUNTER — Other Ambulatory Visit (INDEPENDENT_AMBULATORY_CARE_PROVIDER_SITE_OTHER): Payer: 59

## 2014-10-10 ENCOUNTER — Ambulatory Visit (INDEPENDENT_AMBULATORY_CARE_PROVIDER_SITE_OTHER): Payer: 59 | Admitting: Physician Assistant

## 2014-10-10 ENCOUNTER — Encounter: Payer: Self-pay | Admitting: Physician Assistant

## 2014-10-10 VITALS — BP 144/90 | HR 84 | Ht 68.0 in | Wt 206.0 lb

## 2014-10-10 DIAGNOSIS — K219 Gastro-esophageal reflux disease without esophagitis: Secondary | ICD-10-CM

## 2014-10-10 DIAGNOSIS — K3184 Gastroparesis: Secondary | ICD-10-CM

## 2014-10-10 DIAGNOSIS — R110 Nausea: Secondary | ICD-10-CM

## 2014-10-10 LAB — CBC WITH DIFFERENTIAL/PLATELET
Basophils Absolute: 0.1 10*3/uL (ref 0.0–0.1)
Basophils Relative: 2 % (ref 0.0–3.0)
EOS ABS: 0.2 10*3/uL (ref 0.0–0.7)
Eosinophils Relative: 2.4 % (ref 0.0–5.0)
HCT: 48.9 % (ref 39.0–52.0)
HEMOGLOBIN: 17 g/dL (ref 13.0–17.0)
LYMPHS ABS: 1.6 10*3/uL (ref 0.7–4.0)
LYMPHS PCT: 23.2 % (ref 12.0–46.0)
MCHC: 34.8 g/dL (ref 30.0–36.0)
MCV: 88.8 fl (ref 78.0–100.0)
MONO ABS: 0.4 10*3/uL (ref 0.1–1.0)
Monocytes Relative: 6 % (ref 3.0–12.0)
Neutro Abs: 4.5 10*3/uL (ref 1.4–7.7)
Neutrophils Relative %: 66.4 % (ref 43.0–77.0)
Platelets: 283 10*3/uL (ref 150.0–400.0)
RBC: 5.5 Mil/uL (ref 4.22–5.81)
RDW: 14.8 % (ref 11.5–15.5)
WBC: 6.8 10*3/uL (ref 4.0–10.5)

## 2014-10-10 LAB — BASIC METABOLIC PANEL
BUN: 9 mg/dL (ref 6–23)
CO2: 27 mEq/L (ref 19–32)
Calcium: 9.5 mg/dL (ref 8.4–10.5)
Chloride: 102 mEq/L (ref 96–112)
Creatinine, Ser: 0.86 mg/dL (ref 0.40–1.50)
GFR: 101.83 mL/min (ref >60.00)
GLUCOSE: 113 mg/dL — AB (ref 70–99)
POTASSIUM: 4.2 meq/L (ref 3.5–5.1)
Sodium: 136 mEq/L (ref 135–145)

## 2014-10-10 NOTE — Progress Notes (Signed)
i agree with the above note, plan 

## 2014-10-10 NOTE — Progress Notes (Signed)
Patient ID: Sean Wu, male   DOB: 13-Jun-1971, 44 y.o.   MRN: [id number]   Subjective:    Patient ID: Sean Wu, male    DOB: 01-Feb-1971, 44 y.o.   MRN: [id number]  HPI Sean Wu is a 44 year old white male known to Dr. Ardis Hughs. He comes in today with complaints of persistent nausea. He has diagnosis of bipolar disorder and depression and is on several psychotropic agents. He also has history of gastroparesis with documented gastric retention of 44% at 2 hours in 2014. EGD was done in 2014 as well he had some mild gastritis which was H. pylori negative and colonoscopy in 2014 negative with the exception of diverticulosis. Believe patient had been on metoclopramide at one point but at this time he is on neuroleptic agents which would react adversely with metoclopramide. He says that he tries to follow a gastroparesis diet. He denies any problems with abdominal pain generally he says his nausea is been worse over the past few months and he wonders whether this may be related to anxiety. He says he has tried taking Xanax which does not seem to be helpful. He also states she doesn't want to take Xanax on a regular basis because he knows people get addicted to it. He is not having any problems with vomiting no dysphagia or odynophagia. His appetite is fair and he says he eats less than he used to and does endorse symptoms of early satiety. However he has been gaining weight. He is taking Lynn's S on a daily basis for chronic constipation says usually this produces diarrhea but is effective. He has tried anti-emetics and says most of them are not helpful. He does have a prescription for orally disintegrating Zofran but again says he doesn't think this helps much. He has not discussed his nausea symptoms with his psychiatrist Dr. Toy Care.  Review of Systems.Pertinent positive and negative review of systems were noted in the above HPI section.  All other review of systems was otherwise negative.  Outpatient  Encounter Prescriptions as of 10/10/2014  Medication Sig  . ALPRAZolam (XANAX) 1 MG tablet Take 1 mg by mouth 4 (four) times daily as needed for anxiety.   . Asenapine Maleate (SAPHRIS) 10 MG SUBL Place 20 mg under the tongue daily.  . diphenhydrAMINE (BENADRYL) 25 mg capsule Take 50 mg by mouth every 6 (six) hours as needed.  . lamoTRIgine (LAMICTAL) 150 MG tablet Take 300 mg by mouth daily.   Marland Kitchen LINZESS 145 MCG CAPS capsule TAKE 1 CAPSULE EVERY DAY  . omeprazole (PRILOSEC) 20 MG capsule Take 20 mg by mouth daily.   . ondansetron (ZOFRAN-ODT) 8 MG disintegrating tablet Take 1 tablet (8 mg total) by mouth every 8 (eight) hours as needed for nausea or vomiting.  . pantoprazole (PROTONIX) 40 MG tablet Take 40 mg by mouth as needed.   . polyethylene glycol powder (GLYCOLAX/MIRALAX) powder Take 1 g by mouth 2 (two) times daily.  . prochlorperazine (COMPAZINE) 10 MG tablet TAKE 1 TABLET BY MOUTH EVERY 6 HOURS AS NEEDED  . SUMAtriptan (IMITREX) 100 MG tablet TAKE AS DIRECTED MAY REPEAT IN 2 HOURS IF HEADACHE PERSISTS OR RECURS. MAXIMUM DOSE 2 TABLETS IN 24  . Testosterone (AXIRON) 30 MG/ACT SOLN APPLY 2 ACTUATIONS AND APPLY EVERY MORNING  . zolpidem (AMBIEN) 10 MG tablet Take 10 mg by mouth at bedtime as needed for sleep.   . [DISCONTINUED] ammonium lactate (AMLACTIN) 12 % cream Apply 1 application topically daily as needed for dry  skin.   . [DISCONTINUED] ibuprofen (ADVIL,MOTRIN) 200 MG tablet Take 400 mg by mouth every 6 (six) hours as needed for moderate pain.  . [DISCONTINUED] SUMAtriptan (IMITREX) 100 MG tablet TAKE AS DIRECTED MAY REPEAT IN 2 HOURS IF HEADACHE PERSISTS OR RECURS. MAXIMUM DOSE 2 TABLETS IN 24   No Known Allergies Patient Active Problem List   Diagnosis Date Noted  . Greater trochanteric bursitis of left hip 02/14/2014  . Lumbar radiculopathy, chronic 02/14/2014  . Radiculopathy 11/24/2013  . Unspecified hereditary and idiopathic peripheral neuropathy 11/24/2013  . Back pain  07/12/2013  . Gastroparesis 06/29/2013  . Abscess of arm, right 10/03/2010  . TESTICULAR HYPOFUNCTION 06/12/2009  . SMOKER 06/12/2009  . SLEEP APNEA, OBSTRUCTIVE 06/12/2009  . DEPRESSION 05/10/2009  . ALLERGIC RHINITIS 05/10/2009  . GERD 05/10/2009  . LIBIDO, DECREASED 05/10/2009   History   Social History  . Marital Status: Married    Spouse Name: N/A    Number of Children: 0  . Years of Education: N/A   Occupational History  . sales    Social History Main Topics  . Smoking status: Current Every Day Smoker -- 0.50 packs/day for 15 years    Types: Cigarettes  . Smokeless tobacco: Former Systems developer    Types: [place] date: 09/01/1998     Comment: tobacco info given 08/01/13  . Alcohol Use: Yes     Comment: social  . Drug Use: No  . Sexual Activity: Not on file   Other Topics Concern  . Not on file   Social History Narrative    Mr. Geraghty family history includes Alcoholism in his father; Heart attack in his father; Heart disease in his maternal grandfather; Irritable bowel syndrome in his father. There is no history of Colon cancer, Rectal cancer, or Stomach cancer.      Objective:    Filed Vitals:   10/10/14 0922  BP: 144/90  Pulse: 84    Physical Exam  well-developed white male in no acute distress, blood pressure 144/90 pulse 84 height 5 foot 8 weight 206. HEENT; nontraumatic normocephalic EOMI PERRLA sclera anicteric, Supple; no JVD, Cardiovascular; regular rate and rhythm with S1-S2 no murmur rub or gallop, Pulmonary; clear bilaterally, Abdomen; soft basically nontender there is no palpable mass or hepatosplenomegaly no guarding or rebound, Rectal; exam not done, Extremities ;no clubbing cyanosis or edema skin warm and dry, Psych; mood and affect appropriate       Assessment & Plan:   #1  44 yo male with chronic nausea which is likely multifactorial. He does have diagnosis of gastroparesis which is mild to moderate. He is unable to take metoclopramide  because of interaction with his psych meds. I suspect that his nausea may be a medication side effect with multiple psychiatric meds. #2 chronic GERD controlled #3 diverticulosis #4  bipolar disorder #5 chronic constipation controlled with Linzess  Plan; continue gastroparesis diet Trial of Zofran orally disintegrating 8 mg by mouth every morning he says he is taking this in the past but I don't believe consistently Continue Prilosec 20 mg by mouth every morning Check upper abdominal ultrasound CBC and CMET Patient is asked to make an appointment with his psychiatrist Dr. Toy Care to discuss possibility of medication-induced nausea Follow-up with Dr. Ardis Hughs in 3-4 weeks   Amy Genia Harold PA-C 10/10/2014

## 2014-10-10 NOTE — Patient Instructions (Signed)
Please go to the basement level to have your labs drawn.  You have been scheduled for an abdominal ultrasound at [facility] (1st floor of hospital) on Friday 10-13-2014 at 9:30 am . Please arrive at 9:15 am prior to your appointment for registration. Make certain not to have anything to eat or drink 6 hours prior to your appointment. Should you need to reschedule your appointment, please contact radiology at [phone]. This test typically takes about 30 minutes to perform.  Take the Zofran ( Ondansetron) 8 mg ODT every morning. We have given you a Gastroparesis diet brochure. Make an appointment with Dr. Toy Care to discuss nausea.  Call for a follow up appointment for early March with either Dr. Ardis Hughs or Amy Esterwood PA-C.   You may want to call the end of this month. We do get booked out for 4-8 weeks.

## 2014-10-13 ENCOUNTER — Ambulatory Visit (HOSPITAL_COMMUNITY): Admission: RE | Admit: 2014-10-13 | Payer: 59 | Source: Ambulatory Visit

## 2014-11-02 ENCOUNTER — Ambulatory Visit: Payer: 59 | Admitting: Physician Assistant

## 2014-11-02 ENCOUNTER — Other Ambulatory Visit: Payer: Self-pay | Admitting: Gastroenterology

## 2014-11-02 ENCOUNTER — Other Ambulatory Visit: Payer: Self-pay | Admitting: Internal Medicine

## 2014-11-14 ENCOUNTER — Encounter: Payer: Self-pay | Admitting: Internal Medicine

## 2014-11-14 ENCOUNTER — Ambulatory Visit (INDEPENDENT_AMBULATORY_CARE_PROVIDER_SITE_OTHER): Payer: 59 | Admitting: Internal Medicine

## 2014-11-14 DIAGNOSIS — J3089 Other allergic rhinitis: Secondary | ICD-10-CM

## 2014-11-14 DIAGNOSIS — F17200 Nicotine dependence, unspecified, uncomplicated: Secondary | ICD-10-CM

## 2014-11-14 DIAGNOSIS — Z720 Tobacco use: Secondary | ICD-10-CM

## 2014-11-14 DIAGNOSIS — Z23 Encounter for immunization: Secondary | ICD-10-CM

## 2014-11-14 MED ORDER — NAPHAZOLINE-PHENIRAMINE 0.025-0.3 % OP SOLN: 2.0000 [drp] | Freq: Four times a day (QID) | OPHTHALMIC | Status: DC | PRN

## 2014-11-14 NOTE — Progress Notes (Signed)
Pre visit review using our clinic review tool, if applicable. No additional management support is needed unless otherwise documented below in the visit note. 

## 2014-11-14 NOTE — Patient Instructions (Addendum)

## 2014-11-14 NOTE — Progress Notes (Signed)
Subjective:    Patient ID: Sean Wu, male    DOB: 14-Mar-1971, 44 y.o.   MRN: [id number]  HPI 44 year old patient who presents with a one-month history of intermittent redness involving the left eye only.  He describes a foreign body sensation but no pain.  No change in visual acuity.  Redness seems intermittent.  There has been some partial benefit with Visine.  He does have a history of allergic rhinitis.  Continues to smoke.  Past Medical History  Diagnosis Date  . TESTICULAR HYPOFUNCTION 06/12/2009  . SMOKER 06/12/2009  . DEPRESSION 05/10/2009  . SLEEP APNEA, OBSTRUCTIVE 06/12/2009  . ALLERGIC RHINITIS 05/10/2009  . GERD 05/10/2009  . WEIGHT GAIN 05/10/2009  . LIBIDO, DECREASED 05/10/2009  . Osgood-Schlatter's disease   . Anxiety   . Chronic headaches   . Gastroparesis   . DDD (degenerative disc disease)     History   Social History  . Marital Status: Married    Spouse Name: N/A  . Number of Children: 0  . Years of Education: N/A   Occupational History  . sales    Social History Main Topics  . Smoking status: Current Every Day Smoker -- 0.50 packs/day for 15 years    Types: Cigarettes  . Smokeless tobacco: Former Systems developer    Types: [place] date: 09/01/1998     Comment: tobacco info given 08/01/13  . Alcohol Use: Yes     Comment: social  . Drug Use: No  . Sexual Activity: Not on file   Other Topics Concern  . Not on file   Social History Narrative    Past Surgical History  Procedure Laterality Date  . Lasik Bilateral   . Ankle surgery Left   . Colonoscopy  2014    normal    Family History  Problem Relation Age of Onset  . Heart disease Maternal Grandfather   . Irritable bowel syndrome Father   . Alcoholism Father   . Heart attack Father   . Colon cancer Neg Hx   . Rectal cancer Neg Hx   . Stomach cancer Neg Hx     No Known Allergies  Current Outpatient Prescriptions on File Prior to Visit  Medication Sig Dispense Refill  . ALPRAZolam (XANAX)  1 MG tablet Take 1 mg by mouth 4 (four) times daily as needed for anxiety.     . Asenapine Maleate (SAPHRIS) 10 MG SUBL Place 20 mg under the tongue daily.    . diphenhydrAMINE (BENADRYL) 25 mg capsule Take 50 mg by mouth every 6 (six) hours as needed.    . lamoTRIgine (LAMICTAL) 150 MG tablet Take 300 mg by mouth daily.     Marland Kitchen LINZESS 145 MCG CAPS capsule TAKE 1 CAPSULE EVERY DAY 30 capsule 0  . omeprazole (PRILOSEC) 20 MG capsule TAKE 1 CAPSULE (20 MG TOTAL) BY MOUTH DAILY. 90 capsule 2  . ondansetron (ZOFRAN-ODT) 8 MG disintegrating tablet Take 1 tablet (8 mg total) by mouth every 8 (eight) hours as needed for nausea or vomiting. 30 tablet 3  . pantoprazole (PROTONIX) 40 MG tablet Take 40 mg by mouth as needed.     . polyethylene glycol powder (GLYCOLAX/MIRALAX) powder Take 1 g by mouth 2 (two) times daily.    . prochlorperazine (COMPAZINE) 10 MG tablet TAKE 1 TABLET BY MOUTH EVERY 6 HOURS AS NEEDED 30 tablet 2  . SUMAtriptan (IMITREX) 100 MG tablet TAKE AS DIRECTED MAY REPEAT IN 2 HOURS IF HEADACHE PERSISTS  OR RECURS. MAXIMUM DOSE 2 TABLETS IN 24 6 tablet 2  . Testosterone (AXIRON) 30 MG/ACT SOLN APPLY 2 ACTUATIONS AND APPLY EVERY MORNING 90 mL 2  . zolpidem (AMBIEN) 10 MG tablet Take 10 mg by mouth at bedtime as needed for sleep.     . [DISCONTINUED] omeprazole (PRILOSEC OTC) 20 MG tablet 1 tablet twice daily. Do not eat for one hour after ingestion of the medication 60 tablet 11   No current facility-administered medications on file prior to visit.    BP 130/90 mmHg  Pulse 81  Temp(Src) 97.9 F (36.6 C) (Oral)  Resp 20  Ht 5\' 8"  (1.727 m)  Wt 205 lb (92.987 kg)  BMI 31.18 kg/m2  SpO2 98%      Review of Systems  Constitutional: Negative for fever, chills, appetite change and fatigue.  HENT: Negative for congestion, dental problem, ear pain, hearing loss, sore throat, tinnitus, trouble swallowing and voice change.   Eyes: Positive for redness. Negative for photophobia, pain,  discharge and visual disturbance.  Respiratory: Negative for cough, chest tightness, wheezing and stridor.   Cardiovascular: Negative for chest pain, palpitations and leg swelling.  Gastrointestinal: Negative for nausea, vomiting, abdominal pain, diarrhea, constipation, blood in stool and abdominal distention.  Genitourinary: Negative for urgency, hematuria, flank pain, discharge, difficulty urinating and genital sores.  Musculoskeletal: Negative for myalgias, back pain, joint swelling, arthralgias, gait problem and neck stiffness.  Skin: Negative for rash.  Neurological: Negative for dizziness, syncope, speech difficulty, weakness, numbness and headaches.  Hematological: Negative for adenopathy. Does not bruise/bleed easily.  Psychiatric/Behavioral: Negative for behavioral problems and dysphoric mood. The patient is not nervous/anxious.        Objective:   Physical Exam  Constitutional: He appears well-nourished. No distress.  Eyes:  Normal pupil responses Right eye normal Conjunctival injection involving the left eye, most marked medial Cornea appeared normal No foreign body identified          Assessment & Plan:   Mild conjunctivitis.  Symptoms are intermittent, suggesting allergic etiology, although unilateral. We'll treat with topical eyedrops Referring ophthalmology if unimproved Smoking cessation recommended

## 2014-12-03 ENCOUNTER — Other Ambulatory Visit: Payer: Self-pay | Admitting: Gastroenterology

## 2014-12-07 ENCOUNTER — Telehealth: Payer: Self-pay | Admitting: Internal Medicine

## 2014-12-07 NOTE — Telephone Encounter (Signed)
[facility] [place] [facility]  Patient Name: Sean Wu  DOB: Mar 28, 1971    Initial Comment [name] states his right shoulder has a protruding bump, and it hurts    Nurse Assessment      Guidelines    Guideline Title Affirmed Question Affirmed Notes       Final Disposition User   FINAL ATTEMPT MADE - no message left Harlow Mares, Therapist, sports, Suanne Marker

## 2014-12-08 NOTE — Telephone Encounter (Signed)
Noted  

## 2014-12-08 NOTE — Telephone Encounter (Signed)
Pt did not pick up when call came in yestersday. Pt states he just wasn't sure on were to go for his issue. Pt thinks his shoulder may be half way dislocated.   Pt was not sure what orthopedic dr we prefer.  Gave pt several names, pt states he will call [facility] Pt will ley [place] know.

## 2015-01-03 ENCOUNTER — Other Ambulatory Visit: Payer: Self-pay | Admitting: Internal Medicine

## 2015-01-05 ENCOUNTER — Ambulatory Visit (INDEPENDENT_AMBULATORY_CARE_PROVIDER_SITE_OTHER): Payer: 59 | Admitting: Family Medicine

## 2015-01-05 ENCOUNTER — Encounter: Payer: Self-pay | Admitting: Family Medicine

## 2015-01-05 VITALS — BP 104/69 | HR 100 | Temp 98.0°F | Ht 68.0 in | Wt 201.0 lb

## 2015-01-05 DIAGNOSIS — K3184 Gastroparesis: Secondary | ICD-10-CM

## 2015-01-05 DIAGNOSIS — K5900 Constipation, unspecified: Secondary | ICD-10-CM | POA: Diagnosis not present

## 2015-01-05 DIAGNOSIS — K5909 Other constipation: Secondary | ICD-10-CM | POA: Insufficient documentation

## 2015-01-05 MED ORDER — LUBIPROSTONE 8 MCG PO CAPS: 8.0000 ug | ORAL_CAPSULE | Freq: Two times a day (BID) | ORAL | Status: DC

## 2015-01-05 NOTE — Progress Notes (Signed)
   Subjective:    Patient ID: Sean Wu, male    DOB: 08/17/1971, 44 y.o.   MRN: [id number]  HPI Here to discuss chronic nausea and constipation. He has dealt with these issues for years, and he has seen his PCP and his GI specialist numerous times. He has gastroparesis and has tried multiple medications. He has tried many stool softeners, probiotics, etc with no real success. Currently he takes Linzess and this is effective, however it causes a lot of diarrhea sucj that he can only take it on the weekends. Otherwise the diarrhea is so disruptive that he cannot work. For the past 3 days he has had many small bursts of diarrhea and he knows this is due to an impaction. He has attempted to get this out himself with rectal stimulation to no avail. He is frustrated so he asks our advice.    Review of Systems  Constitutional: Negative.   Respiratory: Negative.   Cardiovascular: Negative.   Gastrointestinal: Positive for nausea, diarrhea, constipation and abdominal distention. Negative for vomiting, abdominal pain, blood in stool, anal bleeding and rectal pain.       Objective:   Physical Exam  Constitutional: He appears well-developed and well-nourished.  Cardiovascular: Normal rate, regular rhythm, normal heart sounds and intact distal pulses.   Pulmonary/Chest: Effort normal and breath sounds normal.  Abdominal: Soft. Bowel sounds are normal. He exhibits no distension and no mass. There is no tenderness. There is no rebound and no guarding.          Assessment & Plan:  Chronic nausea and constipation. The Linzess works for him but causes too much diarrhea, so a less potent agent would be reasonable to try. He will try Amitiza 8 mg bid instead. For the impaction try Fleet enemas with mineral oil. Follow up with Dr. Burnice Logan.

## 2015-01-05 NOTE — Progress Notes (Signed)
Pre visit review using our clinic review tool, if applicable. No additional management support is needed unless otherwise documented below in the visit note. 

## 2015-01-16 ENCOUNTER — Ambulatory Visit (INDEPENDENT_AMBULATORY_CARE_PROVIDER_SITE_OTHER): Payer: 59 | Admitting: Neurology

## 2015-01-16 ENCOUNTER — Encounter: Payer: Self-pay | Admitting: Neurology

## 2015-01-16 VITALS — BP 120/84 | HR 102 | Resp 16 | Ht 69.0 in | Wt 201.0 lb

## 2015-01-16 DIAGNOSIS — F3130 Bipolar disorder, current episode depressed, mild or moderate severity, unspecified: Secondary | ICD-10-CM

## 2015-01-16 DIAGNOSIS — R413 Other amnesia: Secondary | ICD-10-CM

## 2015-01-16 DIAGNOSIS — F3131 Bipolar disorder, current episode depressed, mild: Secondary | ICD-10-CM | POA: Insufficient documentation

## 2015-01-16 DIAGNOSIS — F319 Bipolar disorder, unspecified: Secondary | ICD-10-CM

## 2015-01-16 LAB — TSH: TSH: 1.445 u[IU]/mL (ref 0.350–4.500)

## 2015-01-16 LAB — VITAMIN B12: Vitamin B-12: 390 pg/mL (ref 211–911)

## 2015-01-16 NOTE — Patient Instructions (Signed)
1. Bloodwork for TSH, B12 2. Schedule MRI brain with and without contrast 3. If MRI normal, we will schedule you for Neuropsychological evaluation at [facility] 4. Follow-up in 2 months

## 2015-01-16 NOTE — Progress Notes (Signed)
NEUROLOGY FOLLOW UP OFFICE NOTE  KINSLER SOEDER [id number]  HISTORY OF PRESENT ILLNESS: I had the pleasure of seeing Sean Wu in follow-up in the neurology clinic on 01/17/2015. The patient was last seen on almost a year ago for back pain. He is now followed by Ortho with plans for back and shoulder surgery in the next month. He presents today for evaluation of different symptoms of memory loss, referred by his headache specialist for memory loss. He has been seeing neurologist Dr. Domingo Cocking for migraines, and is currently on Topamax 100mg  qhs. He reports memory changes started in the past year, however recently became more noticeable. He has been to Dr. Domingo Cocking twice, then on his third visit he missed his appointment, then could not recall that he had been there before. He forgets directions to places and has gotten lost driving. His wife has to tell him what he doesn't remember. He has missed doctors appointments and would not recall why he was in the store. He has left the stove on. He became tearful during the visit and reports stumbling on words. He has to write everything now and keeps a calendar. He denies missing medications. He sees Dr. Toy Care for Bipolar disorder and has been taking Saphris and Lamictal for many years. He started Trazodone a year ago, which his wife felt was causing his symptoms. He stopped it 6 months ago but continues to have symptoms. He reports sleep is "horrible," he has difficulty maintaining sleep. He denies snoring, and stopped using his CPAP when he lost weight ("I can't stand it).   He has headaches almost daily, with migraines 10-15 times a month. He used to have them many years ago, but these recurred last year, and Topamax was started. He reports memory changes started before Topamax initiation. He has dizziness with the headaches and upon standing. He has some difficulty swallowing. He denies any diplopia, dysarthria, dysphagia, neck pain, focal  numbness/tingling/weakness except his feet go to sleep frequently. He has gastroparesis and gets diarrhea with Linzess.   PAST MEDICAL HISTORY: Past Medical History  Diagnosis Date  . TESTICULAR HYPOFUNCTION 06/12/2009  . SMOKER 06/12/2009  . DEPRESSION 05/10/2009  . SLEEP APNEA, OBSTRUCTIVE 06/12/2009  . ALLERGIC RHINITIS 05/10/2009  . GERD 05/10/2009  . WEIGHT GAIN 05/10/2009  . LIBIDO, DECREASED 05/10/2009  . Osgood-Schlatter's disease   . Anxiety   . Chronic headaches   . Gastroparesis   . DDD (degenerative disc disease)     MEDICATIONS: Current Outpatient Prescriptions on File Prior to Visit  Medication Sig Dispense Refill  . ALPRAZolam (XANAX) 1 MG tablet Take 1 mg by mouth 4 (four) times daily as needed for anxiety.     . Asenapine Maleate (SAPHRIS) 10 MG SUBL Place 20 mg under the tongue daily.    . diphenhydrAMINE (BENADRYL) 25 mg capsule Take 50 mg by mouth every 6 (six) hours as needed.    . lamoTRIgine (LAMICTAL) 150 MG tablet Take 300 mg by mouth daily.     Marland Kitchen omeprazole (PRILOSEC) 20 MG capsule TAKE 1 CAPSULE (20 MG TOTAL) BY MOUTH DAILY. 90 capsule 2  . pantoprazole (PROTONIX) 40 MG tablet Take 40 mg by mouth as needed.     . polyethylene glycol powder (GLYCOLAX/MIRALAX) powder Take 1 g by mouth 2 (two) times daily.    . Testosterone (AXIRON) 30 MG/ACT SOLN APPLY 2 ACTUATIONS AND APPLY EVERY MORNING 90 mL 2  . topiramate (TOPAMAX) 25 MG tablet Take 100 mg by mouth daily.  Three tablets at bedtime  1  . zolpidem (AMBIEN) 10 MG tablet Take 10 mg by mouth at bedtime as needed for sleep.     . [DISCONTINUED] omeprazole (PRILOSEC OTC) 20 MG tablet 1 tablet twice daily. Do not eat for one hour after ingestion of the medication 60 tablet 11   No current facility-administered medications on file prior to visit.    ALLERGIES: No Known Allergies  FAMILY HISTORY: Family History  Problem Relation Age of Onset  . Heart disease Maternal Grandfather   . Irritable bowel syndrome  Father   . Alcoholism Father   . Heart attack Father   . Colon cancer Neg Hx   . Rectal cancer Neg Hx   . Stomach cancer Neg Hx     SOCIAL HISTORY: History   Social History  . Marital Status: Married    Spouse Name: N/A  . Number of Children: 0  . Years of Education: N/A   Occupational History  . sales    Social History Main Topics  . Smoking status: Current Every Day Smoker -- 15 years    Types: Cigarettes  . Smokeless tobacco: Former Systems developer    Types: [place] date: 09/01/1998     Comment: tobacco info given 08/01/13  . Alcohol Use: No     Comment: social  . Drug Use: No  . Sexual Activity: Not on file   Other Topics Concern  . Not on file   Social History Narrative    REVIEW OF SYSTEMS: Constitutional: No fevers, chills, or sweats, no generalized fatigue, change in appetite Eyes: No visual changes, double vision, eye pain Ear, nose and throat: No hearing loss, ear pain, nasal congestion, sore throat Cardiovascular: No chest pain, palpitations Respiratory:  No shortness of breath at rest or with exertion, wheezes GastrointestinaI: No nausea, vomiting, diarrhea, abdominal pain, fecal incontinence Genitourinary:  No dysuria, urinary retention or frequency Musculoskeletal:  No neck pain, +back pain Integumentary: No rash, pruritus, skin lesions Neurological: as above Psychiatric: + depression, insomnia, anxiety Endocrine: No palpitations, fatigue, diaphoresis, mood swings, change in appetite, change in weight, increased thirst Hematologic/Lymphatic:  No anemia, purpura, petechiae. Allergic/Immunologic: no itchy/runny eyes, nasal congestion, recent allergic reactions, rashes  PHYSICAL EXAM: Filed Vitals:   01/16/15 0824  BP: 120/84  Pulse: 102  Resp: 16   General: No acute distress Head:  Normocephalic/atraumatic Neck: supple, no paraspinal tenderness, full range of motion Heart:  Regular rate and rhythm Lungs:  Clear to auscultation bilaterally Back:  No paraspinal tenderness Skin/Extremities: No rash, no edema Neurological Exam: alert and oriented to person, place, and time. No aphasia or dysarthria. Fund of knowledge is appropriate.  Recent and remote memory are intact.  Attention and concentration are normal.    Able to name objects and repeat phrases.  MMSE - Mini Mental State Exam 01/16/2015  Orientation to time 5  Orientation to Place 5  Registration 3  Attention/ Calculation 5  Recall 2  Language- name 2 objects 2  Language- repeat 1  Language- follow 3 step command 3  Language- read & follow direction 1  Write a sentence 1  Copy design 1  Total score 29   Cranial nerves: Pupils equal, round, reactive to light.  Fundoscopic exam unremarkable, no papilledema. Extraocular movements intact with no nystagmus. Visual fields full. Facial sensation intact. No facial asymmetry. Tongue, uvula, palate midline.  Motor: Bulk and tone normal, muscle strength 5/5 throughout with no pronator drift.  Sensation to light  touch intact.  No extinction to double simultaneous stimulation.  Deep tendon reflexes 2+ throughout except for absent ankle jerks bilaterally, toes downgoing.  Finger to nose testing intact.  Gait narrow-based and steady, able to tandem walk adequately.  Romberg negative.  IMPRESSION: This is a 44 year old right-handed man who I had previously seen for back pain, with plans for lumbar surgery this month. He had been seeing neurologist Dr. Domingo Cocking for migraines, and reported worsening memory. His MMSE today is normal 29/30, however he reports getting lost driving, needing to make lists and reminders. His neurological exam is normal. We discussed different causes of memory loss. Check TSH and B12. MRI brain with and without contrast will be ordered to assess for underlying structural abnormality and assess vascular load. We discussed pseudodementia and effects of mood on memory. He reported being under a lot of stress. If bloodwork and MRI  unrevealing, he will be referred for Neuropsychological evaluation to further delineate his symptoms. He will discuss his symptoms with his psychiatrist as well. He will follow-up after the tests.   Thank you for allowing me to articipate in his care.  Please do not hesitate to call for any questions or concerns.  The duration of this appointment visit was 25 minutes of face-to-face time with the patient.  Greater than 50% of this time was spent in counseling, explanation of diagnosis, planning of further management, and coordination of care.   Ellouise Newer, M.D.   CC: Dr. Burnice Logan, Dr. Domingo Cocking

## 2015-01-17 ENCOUNTER — Telehealth: Payer: Self-pay | Admitting: Family Medicine

## 2015-01-17 NOTE — Telephone Encounter (Signed)
Lmovm

## 2015-01-17 NOTE — Telephone Encounter (Signed)
Pt returning your call, please call back at [phone] / Venida Jarvis

## 2015-01-17 NOTE — Telephone Encounter (Signed)
Lmovm to return my call. 

## 2015-01-17 NOTE — Telephone Encounter (Signed)
-----   Message from Alda Berthold, DO sent at 01/17/2015  7:53 AM EDT ----- Please notify patient lab are within normal limits.  Thank you.

## 2015-01-17 NOTE — Telephone Encounter (Signed)
Patient was notified of results.  

## 2015-01-18 ENCOUNTER — Telehealth: Payer: Self-pay | Admitting: Family Medicine

## 2015-01-18 NOTE — Telephone Encounter (Signed)
Called [facility] provider line for PA for MRI brain with & without contrast. No PA required for patient's plan.

## 2015-01-23 ENCOUNTER — Other Ambulatory Visit: Payer: Self-pay | Admitting: Orthopedic Surgery

## 2015-01-24 ENCOUNTER — Ambulatory Visit (HOSPITAL_COMMUNITY): Admission: RE | Admit: 2015-01-24 | Payer: 59 | Source: Ambulatory Visit

## 2015-01-29 ENCOUNTER — Other Ambulatory Visit: Payer: Self-pay | Admitting: Family Medicine

## 2015-02-06 ENCOUNTER — Telehealth: Payer: Self-pay | Admitting: Internal Medicine

## 2015-02-06 NOTE — Telephone Encounter (Addendum)
Patient would like to switch from testosterone replacement gel to the injection or oral pill.  He would like a callback advising on if Dr. Raliegh Ip will approve. Patient states okay to leave detailed message on voicemail if needed.

## 2015-02-07 ENCOUNTER — Ambulatory Visit (INDEPENDENT_AMBULATORY_CARE_PROVIDER_SITE_OTHER): Payer: 59 | Admitting: Neurology

## 2015-02-07 ENCOUNTER — Encounter: Payer: Self-pay | Admitting: Neurology

## 2015-02-07 VITALS — BP 132/90 | HR 88 | Resp 18 | Ht 68.0 in | Wt 198.0 lb

## 2015-02-07 DIAGNOSIS — F439 Reaction to severe stress, unspecified: Secondary | ICD-10-CM

## 2015-02-07 DIAGNOSIS — F3130 Bipolar disorder, current episode depressed, mild or moderate severity, unspecified: Secondary | ICD-10-CM

## 2015-02-07 DIAGNOSIS — F419 Anxiety disorder, unspecified: Secondary | ICD-10-CM | POA: Diagnosis not present

## 2015-02-07 DIAGNOSIS — F319 Bipolar disorder, unspecified: Secondary | ICD-10-CM

## 2015-02-07 DIAGNOSIS — F5104 Psychophysiologic insomnia: Secondary | ICD-10-CM

## 2015-02-07 DIAGNOSIS — Z638 Other specified problems related to primary support group: Secondary | ICD-10-CM | POA: Diagnosis not present

## 2015-02-07 DIAGNOSIS — G4700 Insomnia, unspecified: Secondary | ICD-10-CM

## 2015-02-07 NOTE — Telephone Encounter (Signed)
Left detailed message for pt he will need to check with your insurance what forms of Testosterone they will pay for and there is no pill form. Please call me back with information.

## 2015-02-07 NOTE — Patient Instructions (Signed)
Please remember to try to maintain good sleep hygiene, which means: Keep a regular sleep and wake schedule, try not to exercise or have a meal within 2 hours of your bedtime, try to keep your bedroom conducive for sleep, that is, cool and dark, without light distractors such as an illuminated alarm clock, and refrain from watching TV right before sleep or in the middle of the night and do not keep the TV or radio on during the night. Also, try not to use or play on electronic devices at bedtime, such as your cell phone, tablet PC or laptop. If you like to read at bedtime on an electronic device, try to dim the background light as much as possible. Do not eat in the middle of the night.   Please reduce your caffeine intake, I think you drink too much sweat tea. Drink more water, stop smoking.   You may benefit from seeing a counselor - talk to Dr. Toy Care about it.   I will make some recommendation to her. I do not think you need a sleep study at this time.

## 2015-02-07 NOTE — Progress Notes (Signed)
Subjective:    Patient ID: Sean Wu is a 44 y.o. male.  HPI     Star Age, MD, PhD [facility] [address]O. [place], [place] [address]  Dear Ginny Forth,   I saw your patient, Sean Wu, upon your kind request in my neurologic clinic today for initial consultation of his sleep disorder, in particular, difficulty with sleep maintenance in the context of a prior diagnosis of OSA. The patient is unaccompanied today. As you know, Sean Wu is a 44 year old right-handed gentleman with an underlying medical history of smoking, bipolar disorder, allergic rhinitis, reflux disease, overweight state, chronic headaches, for which he sees Dr. Domingo [facility]ing [facility], and memory loss for which he sees Dr. Delice Lesch at [facility] neurology, who reports a long-standing history of difficulty with sleep onset and sleep maintenance. He has a prior diagnosis of obstructive sleep apnea but was able to lose over 30 pounds and no longer snores and denies any apneas or gasping sensations. His wife has not reported any apneas in his sleep. He has been on over-the-counter and prescription sleeping pills. He is currently taking up to 5 pills of Benadryl or the generic sleep aid every day. He has taken as many as 7 at a time. He's currently on Rozerem which helps him go to sleep does not help him stay asleep. He has tried Ambien but has not been on it for the past month. He has tried Seroquel in the past which caused side effects including excessive drowsiness even on a low-dose he says. He also tried trazodone which caused him excessive drowsiness the next day. Belsomra does not help. He is currently on Librium, 3 pills each night and takes 1 mg of Xanax. He has been on diazepam in the past. He is currently on Lamictal 150 mg twice daily for his mood disorder.  He reports very occasional mild restless leg symptoms. He is not known to twitch in his sleep and  restless leg symptoms do not tend to wake him up or keep them up. He denies any parasomnias. He has not tried [place], he has not tried meditation or cognitive behavioral therapy. He endorses a lot of stress. He says he stresses over little things. He has stress at home and feels that there is no obvious reason for this. He does not currently see a Social worker. He usually goes to bed when his wife goes to bed. This is not necessarily a set schedule. Her rise time is around 7 AM and usually he gets out of bed at 7. He watches TV in bed. TV tends to stay on at night. He tries to exercise every morning. He is currently unemployed. He does not have any children. 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 His wife works full-time. It does not sound like they have financial stressors. He is currently smoking but is trying to quit. He currently smokes half a pack per day. He drinks alcohol rarely. He drinks a lot of caffeine, primarily in the form of sweet tea, 5-6 large glasses per day.  His Past Medical History Is Significant For: Past Medical History  Diagnosis Date  . TESTICULAR HYPOFUNCTION 06/12/2009  . SMOKER 06/12/2009  . DEPRESSION 05/10/2009  . SLEEP APNEA, OBSTRUCTIVE 06/12/2009  . ALLERGIC RHINITIS 05/10/2009  . GERD 05/10/2009  . WEIGHT GAIN 05/10/2009  . LIBIDO, DECREASED 05/10/2009  . Osgood-Schlatter's disease   .  Anxiety   . Chronic headaches   . Gastroparesis   . DDD (degenerative disc disease)     His Past Surgical History Is Significant For: Past Surgical History  Procedure Laterality Date  . Lasik Bilateral   . Ankle surgery Left   . Colonoscopy  2014    normal    His Family History Is Significant For: Family History  Problem Relation Age of Onset  . Heart disease Maternal Grandfather   . Irritable bowel syndrome Father   . Alcoholism Father   . Heart attack Father   . Colon cancer Neg Hx   . Rectal cancer Neg Hx   . Stomach cancer Neg Hx      His Social History Is Significant For: History   Social History  . Marital Status: Married    Spouse Name: N/A  . Number of Children: 0  . Years of Education: College   Occupational History  . Unemployeed    Social History Main Topics  . Smoking status: Current Every Day Smoker -- 15 years    Types: Cigarettes  . Smokeless tobacco: Former Systems developer    Types: [place] date: 09/01/1998     Comment: tobacco info given 08/01/13  . Alcohol Use: No     Comment: social  . Drug Use: No  . Sexual Activity: Not on file   Other Topics Concern  . None   Social History Narrative    His Allergies Are:  No Known Allergies:   His Current Medications Are:  Outpatient Encounter Prescriptions as of 02/07/2015  Medication Sig  . ALPRAZolam (XANAX) 1 MG tablet Take 1 mg by mouth 1 day or 1 dose.   Marland Kitchen AMITIZA 8 MCG capsule TAKE 1 CAPSULE (8 MCG TOTAL) BY MOUTH 2 (TWO) TIMES DAILY WITH A MEAL.  Marland Kitchen Asenapine Maleate (SAPHRIS) 10 MG SUBL Place 20 mg under the tongue daily.  . baclofen (LIORESAL) 10 MG tablet TAKE 1/2 TO 1 TAB UP TO TWICE A DAY AS NEEDED FOR HEADACHE *LIMIT TO 2 DAYS PER WEEK.AVOID DAILY USE  . diphenhydrAMINE (BENADRYL) 25 mg capsule Take 50 mg by mouth every 6 (six) hours as needed.  . doxylamine, Sleep, (SLEEP AID) 25 MG tablet Take 25 mg by mouth at bedtime as needed (Patient reports taking 3-5 tabs hs).  . lamoTRIgine (LAMICTAL) 150 MG tablet Take 300 mg by mouth daily.   Marland Kitchen omeprazole (PRILOSEC) 20 MG capsule TAKE 1 CAPSULE (20 MG TOTAL) BY MOUTH DAILY.  Marland Kitchen prochlorperazine (COMPAZINE) 10 MG tablet Take 10 mg by mouth every 6 (six) hours as needed.  Marland Kitchen ROZEREM 8 MG tablet Take 8 mg by mouth at bedtime.  . Testosterone (AXIRON) 30 MG/ACT SOLN APPLY 2 ACTUATIONS AND APPLY EVERY MORNING  . topiramate (TOPAMAX) 100 MG tablet Take 100 mg by mouth daily.  Marland Kitchen topiramate (TOPAMAX) 50 MG tablet   . zolpidem (AMBIEN) 10 MG tablet Take 10 mg by mouth at bedtime as needed for sleep.    . [DISCONTINUED] LINZESS 145 MCG CAPS capsule Take 145 mcg by mouth daily. Take 1 capsule daily  . [DISCONTINUED] pantoprazole (PROTONIX) 40 MG tablet Take 40 mg by mouth as needed.   . [DISCONTINUED] polyethylene glycol powder (GLYCOLAX/MIRALAX) powder Take 1 g by mouth 2 (two) times daily.  . [DISCONTINUED] topiramate (TOPAMAX) 25 MG tablet Take 100 mg by mouth daily. Take 1 tablet at bedtime   No facility-administered encounter medications on file as of 02/07/2015.  :  Review  of Systems:  Out of a complete 14 point review of systems, all are reviewed and negative with the exception of these symptoms as listed below:   Review of Systems  Constitutional: Positive for fatigue and unexpected weight change.  HENT: Positive for rhinorrhea and trouble swallowing.        Spinning sensation   Eyes:       Blurred vision   Gastrointestinal: Positive for diarrhea, constipation and blood in stool.  Genitourinary: Positive for difficulty urinating.  Musculoskeletal:       Joint pain, joint swelling, aching muscles  Allergic/Immunologic: Positive for environmental allergies.  Neurological: Positive for dizziness, tremors, numbness and headaches.       Memory loss, confusion, slurred speech, insomnia, trouble falling asleep and staying asleep, H/O using CPAP machine several years ago, no witnessed apnea, wakes up feeling tired, daytimes tiredness, occasionally takes naps during the day.   Psychiatric/Behavioral:       Depression, anxiety, not enough sleep, decreased energy, change in appetite, disinterest in activities, racing thoughts    Objective:  Neurologic Exam  Physical Exam Physical Examination:   Filed Vitals:   02/07/15 1501  BP: 132/90  Pulse: 88  Resp: 18    General Examination: The patient is a very pleasant 44 y.o. male in no acute distress. He appears well-developed and well-nourished and well groomed. He is anxious appearing.  HEENT: Normocephalic, atraumatic, pupils are  equal, round and reactive to light and accommodation. Funduscopic exam is normal with sharp disc margins noted. Extraocular tracking is good without limitation to gaze excursion or nystagmus noted. Normal smooth pursuit is noted. Hearing is grossly intact. Tympanic membranes are clear bilaterally. Face is symmetric with normal facial animation and normal facial sensation. Speech is clear with no dysarthria noted. There is no hypophonia. There is no lip, neck/head, jaw or voice tremor. Neck is supple with full range of passive and active motion. There are no carotid bruits on auscultation. Oropharynx exam reveals: mild mouth dryness, mild pharyngeal erythema, adequate dental hygiene, mild airway crowding, due to narrow airway entry and redundant soft palate. Mallampati is class II.  Chest: Clear to auscultation without wheezing, rhonchi or crackles noted.  Heart: S1+S2+0, regular and normal without murmurs, rubs or gallops noted.   Abdomen: Soft, non-tender and non-distended with normal bowel sounds appreciated on auscultation.  Extremities: There is no pitting edema in the distal lower extremities bilaterally. Pedal pulses are intact.  Skin: Warm and dry without trophic changes noted. There are no varicose veins.  Musculoskeletal: exam reveals no obvious joint deformities, tenderness or joint swelling or erythema.   Neurologically:  Mental status: The patient is awake, alert and oriented in all 4 spheres. His immediate and remote memory, attention, language skills and fund of knowledge are fairly appropriate. There is no evidence of aphasia, agnosia, apraxia or anomia. Speech is clear with normal prosody and enunciation. Thought process is linear. Mood is normal and affect is constricted.  Cranial nerves II - XII are as described above under HEENT exam. In addition: shoulder shrug is normal with equal shoulder height noted. Motor exam: Normal bulk, strength and tone is noted. There is no drift,  tremor or rebound. Reflexes are 2+ bilaterally. Gait, station and balance are unremarkable.   Assessment and plan:    In summary, CAMPBELL KRAY is a very pleasant 44 y.o.-year old male with an underlying medical history of smoking, bipolar disorder, allergic rhinitis, reflux disease, overweight state, chronic headaches, for which he sees  Dr[facility]ingo [facility]ing [facility] the [facility], and memory loss for which he sees Dr. Delice Lesch at [facility] neurology, who reports a long-standing history of difficulty with sleep onset and sleep maintenance. He has chronic insomnia in the context of mood disorder including bipolar disorder, significant anxiety and stress reported. It does not sound like he has significant sleep disordered breathing. He has a prior diagnosis of obstructive sleep apnea but was able to lose weight and currently his history is not suggestive of any underlying sleep disordered breathing. He has mild intermittent symptoms of restless legs which are not a big player at this time. I talked to the patient at length about chronic insomnia. I explained to him that chronic insomnia can be very difficult and challenging to treat. He has tried multiple prescription and nonprescription medications. He strongly advised not to over use Benadryl. He is advised regarding sleep hygiene and I asked him to reduce his caffeine intake and increase his water intake. He is advised to quit smoking. I encouraged him to continue to exercise regularly. He is advised to talk to you about potentially trying Lunesta. I would suggest that he come off of his other sleep aids before you try that. In addition, he may benefit from counseling to improve his anxiety and reduce stress. Furthermore, you can consider cognitive behavioral therapy as a nonpharmacological approach to insomnia treatment. At this juncture, I advised the patient that I will make recommendations to you. He was in agreement. I will see him back on an as-needed  basis. Thank you very much for allowing me to participate in the care of this nice patient. If I can be of any further assistance to you please do not hesitate to call me at [phone].  Sincerely,   Star Age, MD, PhD  I spent 30 minutes in total face-to-face time with the patient, more than 50% of which was spent in counseling and coordination of care, reviewing test results, reviewing medication and discussing or reviewing the diagnosis of insomnia, its prognosis and treatment options.

## 2015-02-08 ENCOUNTER — Other Ambulatory Visit: Payer: Self-pay | Admitting: Internal Medicine

## 2015-02-09 ENCOUNTER — Encounter: Payer: Self-pay | Admitting: Gastroenterology

## 2015-02-09 ENCOUNTER — Telehealth: Payer: Self-pay | Admitting: Gastroenterology

## 2015-02-09 ENCOUNTER — Ambulatory Visit (INDEPENDENT_AMBULATORY_CARE_PROVIDER_SITE_OTHER): Payer: 59 | Admitting: Gastroenterology

## 2015-02-09 ENCOUNTER — Encounter (INDEPENDENT_AMBULATORY_CARE_PROVIDER_SITE_OTHER): Payer: Self-pay

## 2015-02-09 VITALS — BP 108/80 | HR 97 | Ht 68.0 in | Wt 199.0 lb

## 2015-02-09 DIAGNOSIS — K5909 Other constipation: Secondary | ICD-10-CM | POA: Diagnosis not present

## 2015-02-09 MED ORDER — ONDANSETRON 8 MG PO TBDP: 8.0000 mg | ORAL_TABLET | Freq: Three times a day (TID) | ORAL | Status: DC | PRN

## 2015-02-09 NOTE — Progress Notes (Signed)
Review of pertinent gastrointestinal problems:  1. routine risk for colon cancer, colonoscopy March 2014 found diverticulosis only. Recommended for recall colonoscopy at 10 year interval  2. Nausea, dysphagia, weight loss?. Gastroparesis+ : EGD 03/2013 found H. Pylori Neg gastritis, + retained food in stomach. GES 04/2013 confirmed gastroparesis. 05/2013 Reglan 5mg  tid started without much help. 10/14 altered reglan dosing. 3. Left flank pain 10/14 underwent non-contrast CT scan abd/pelvis; was normal   HPI: This is a very pleasant 44 year old man whom I last saw several months ago.  Chief complaint is constipation, improved  He has been taking linzess daily, this will cause explosive diarrhea.  If he does not take it, then he will have a "blockage."  He has 'basically' stopped taking this.  Takes it on as as needed basis now (about 1 every 2 weeks).  He has started probiotics and is happy about the response to this  (gets it at the [facility] shop[facility], not sure what it is called, it is very expensive). This is one pill once daily.     Says his bowels are much better on probiotics and stopping linzess.  Solid, goes twice a day.  Very happy with the results.   He is eating better.  Overall stable weight.     Was in office 4 months ago, recommended daily AM zofran for nausea (he never started this).  Also [place] (he did not get this)  Past Medical History  Diagnosis Date  . TESTICULAR HYPOFUNCTION 06/12/2009  . SMOKER 06/12/2009  . DEPRESSION 05/10/2009  . SLEEP APNEA, OBSTRUCTIVE 06/12/2009  . ALLERGIC RHINITIS 05/10/2009  . GERD 05/10/2009  . WEIGHT GAIN 05/10/2009  . LIBIDO, DECREASED 05/10/2009  . Osgood-Schlatter's disease   . Anxiety   . Chronic headaches   . Gastroparesis   . DDD (degenerative disc disease)     Past Surgical History  Procedure Laterality Date  . Lasik Bilateral   . Ankle surgery Left   . Colonoscopy  2014    normal    Current Outpatient Prescriptions   Medication Sig Dispense Refill  . ALPRAZolam (XANAX) 1 MG tablet Take 1 mg by mouth 1 day or 1 dose.     Marland Kitchen AMITIZA 8 MCG capsule TAKE 1 CAPSULE (8 MCG TOTAL) BY MOUTH 2 (TWO) TIMES DAILY WITH A MEAL. 60 capsule 2  . Asenapine Maleate (SAPHRIS) 10 MG SUBL Place 20 mg under the tongue daily.    . baclofen (LIORESAL) 10 MG tablet TAKE 1/2 TO 1 TAB UP TO TWICE A DAY AS NEEDED FOR HEADACHE *LIMIT TO 2 DAYS PER WEEK.AVOID DAILY USE  0  . diphenhydrAMINE (BENADRYL) 25 mg capsule Take 50 mg by mouth every 6 (six) hours as needed.    . doxylamine, Sleep, (SLEEP AID) 25 MG tablet Take 25 mg by mouth at bedtime as needed (Patient reports taking 3-5 tabs hs).    . lamoTRIgine (LAMICTAL) 150 MG tablet Take 300 mg by mouth daily.     Marland Kitchen omeprazole (PRILOSEC) 20 MG capsule TAKE 1 CAPSULE (20 MG TOTAL) BY MOUTH DAILY. 90 capsule 2  . prochlorperazine (COMPAZINE) 10 MG tablet Take 10 mg by mouth every 6 (six) hours as needed.  2  . ROZEREM 8 MG tablet Take 8 mg by mouth at bedtime.  12  . Testosterone (AXIRON) 30 MG/ACT SOLN APPLY 2 ACTUATIONS AND APPLY EVERY MORNING 90 mL 2  . topiramate (TOPAMAX) 100 MG tablet Take 100 mg by mouth daily.  1  . topiramate (TOPAMAX)  50 MG tablet     . zolpidem (AMBIEN) 10 MG tablet Take 10 mg by mouth at bedtime as needed for sleep.     . [DISCONTINUED] omeprazole (PRILOSEC OTC) 20 MG tablet 1 tablet twice daily. Do not eat for one hour after ingestion of the medication 60 tablet 11   No current facility-administered medications for this visit.    Allergies as of 02/09/2015  . (No Known Allergies)    Family History  Problem Relation Age of Onset  . Heart disease Maternal Grandfather   . Irritable bowel syndrome Father   . Alcoholism Father   . Heart attack Father   . Colon cancer Neg Hx   . Rectal cancer Neg Hx   . Stomach cancer Neg Hx     History   Social History  . Marital Status: Married    Spouse Name: N/A  . Number of Children: 0  . Years of Education:  College   Occupational History  . Unemployeed    Social History Main Topics  . Smoking status: Current Every Day Smoker -- 15 years    Types: Cigarettes  . Smokeless tobacco: Former Systems developer    Types: [place] date: 09/01/1998     Comment: tobacco info given 08/01/13  . Alcohol Use: No     Comment: social  . Drug Use: No  . Sexual Activity: Not on file   Other Topics Concern  . Not on file   Social History Narrative     Physical Exam: BP 108/80 mmHg  Pulse 97  Ht 5\' 8"  (1.727 m)  Wt 199 lb (90.266 kg)  BMI 30.26 kg/m2 Constitutional: generally well-appearing Psychiatric: alert and oriented x3 Abdomen: soft, nontender, nondistended, no obvious ascites, no peritoneal signs, normal bowel sounds   Assessment and plan: 44 y.o. male with improved constipation  He is very happy with his bowel patterns since he has started taking an over-the-counter probiotic from the vitamin shop daily. He is using Linzess on an as-needed basis only when he feels he is getting a bit blocked up, this is about once every 2 weeks. I recommended that he continue this regimen since it is so helpful for his bowels. He will return to see me on an as-needed basis.   Owens Loffler, MD [facility] 02/09/2015, 10:09 AM

## 2015-02-09 NOTE — Telephone Encounter (Signed)
Pt never received a prescription for Zofran, I sent this per the office note.

## 2015-02-09 NOTE — Patient Instructions (Addendum)
You have been given a separate informational sheet regarding your tobacco use, the importance of quitting and local resources to help you quit. Since you have noticed such an improvement in your bowels, keep taking the newer probiotic daily and continue taking linzess on an as needed basis only.

## 2015-02-12 NOTE — Telephone Encounter (Signed)
Rx for [place] faxed to [facility].

## 2015-02-16 ENCOUNTER — Other Ambulatory Visit: Payer: Self-pay | Admitting: Specialist

## 2015-02-16 DIAGNOSIS — R519 Headache, unspecified: Secondary | ICD-10-CM

## 2015-02-16 DIAGNOSIS — G8929 Other chronic pain: Secondary | ICD-10-CM

## 2015-02-16 DIAGNOSIS — R413 Other amnesia: Secondary | ICD-10-CM

## 2015-02-16 DIAGNOSIS — R51 Headache: Principal | ICD-10-CM

## 2015-02-19 ENCOUNTER — Other Ambulatory Visit: Payer: Self-pay | Admitting: Orthopedic Surgery

## 2015-02-19 DIAGNOSIS — M545 Low back pain: Secondary | ICD-10-CM

## 2015-02-21 ENCOUNTER — Encounter (HOSPITAL_BASED_OUTPATIENT_CLINIC_OR_DEPARTMENT_OTHER): Payer: Self-pay

## 2015-02-23 ENCOUNTER — Other Ambulatory Visit: Payer: 59

## 2015-02-25 ENCOUNTER — Ambulatory Visit
Admission: RE | Admit: 2015-02-25 | Discharge: 2015-02-25 | Disposition: A | Discharge status: Home / Self Care | Payer: 59 | Source: Ambulatory Visit | Attending: Orthopedic Surgery | Admitting: Orthopedic Surgery

## 2015-02-25 ENCOUNTER — Ambulatory Visit
Admission: RE | Admit: 2015-02-25 | Discharge: 2015-02-25 | Disposition: A | Discharge status: Home / Self Care | Payer: 59 | Source: Ambulatory Visit | Attending: Specialist | Admitting: Specialist

## 2015-02-25 DIAGNOSIS — R519 Headache, unspecified: Secondary | ICD-10-CM

## 2015-02-25 DIAGNOSIS — M545 Low back pain: Secondary | ICD-10-CM

## 2015-02-25 DIAGNOSIS — G8929 Other chronic pain: Secondary | ICD-10-CM

## 2015-02-25 DIAGNOSIS — R51 Headache: Principal | ICD-10-CM

## 2015-02-25 DIAGNOSIS — R413 Other amnesia: Secondary | ICD-10-CM

## 2015-02-25 MED ORDER — GADOBENATE DIMEGLUMINE 529 MG/ML IV SOLN
18.0000 mL | Freq: Once | INTRAVENOUS | Status: AC | PRN
  Administered 2015-02-25: 18 mL via INTRAVENOUS

## 2015-02-26 ENCOUNTER — Encounter (HOSPITAL_BASED_OUTPATIENT_CLINIC_OR_DEPARTMENT_OTHER)
Admission: RE | Disposition: A | Discharge status: Home / Self Care | Payer: Self-pay | Source: Ambulatory Visit | Attending: Orthopedic Surgery

## 2015-02-26 ENCOUNTER — Ambulatory Visit (HOSPITAL_BASED_OUTPATIENT_CLINIC_OR_DEPARTMENT_OTHER): Payer: 59 | Admitting: Anesthesiology

## 2015-02-26 ENCOUNTER — Ambulatory Visit (HOSPITAL_BASED_OUTPATIENT_CLINIC_OR_DEPARTMENT_OTHER)
Admission: RE | Admit: 2015-02-26 | Discharge: 2015-02-26 | Disposition: A | Discharge status: Home / Self Care | Payer: 59 | Source: Ambulatory Visit | Attending: Orthopedic Surgery | Admitting: Orthopedic Surgery

## 2015-02-26 ENCOUNTER — Encounter (HOSPITAL_BASED_OUTPATIENT_CLINIC_OR_DEPARTMENT_OTHER): Payer: Self-pay | Admitting: *Deleted

## 2015-02-26 DIAGNOSIS — M9250 Juvenile osteochondrosis of tibia and fibula, unspecified leg: Secondary | ICD-10-CM | POA: Diagnosis not present

## 2015-02-26 DIAGNOSIS — M19011 Primary osteoarthritis, right shoulder: Secondary | ICD-10-CM | POA: Diagnosis not present

## 2015-02-26 DIAGNOSIS — S43431A Superior glenoid labrum lesion of right shoulder, initial encounter: Secondary | ICD-10-CM | POA: Diagnosis not present

## 2015-02-26 DIAGNOSIS — K219 Gastro-esophageal reflux disease without esophagitis: Secondary | ICD-10-CM | POA: Insufficient documentation

## 2015-02-26 DIAGNOSIS — Y9289 Other specified places as the place of occurrence of the external cause: Secondary | ICD-10-CM | POA: Insufficient documentation

## 2015-02-26 DIAGNOSIS — F319 Bipolar disorder, unspecified: Secondary | ICD-10-CM | POA: Insufficient documentation

## 2015-02-26 DIAGNOSIS — Y998 Other external cause status: Secondary | ICD-10-CM | POA: Diagnosis not present

## 2015-02-26 DIAGNOSIS — R51 Headache: Secondary | ICD-10-CM | POA: Diagnosis not present

## 2015-02-26 DIAGNOSIS — X58XXXA Exposure to other specified factors, initial encounter: Secondary | ICD-10-CM | POA: Diagnosis not present

## 2015-02-26 DIAGNOSIS — F17210 Nicotine dependence, cigarettes, uncomplicated: Secondary | ICD-10-CM | POA: Diagnosis not present

## 2015-02-26 DIAGNOSIS — G4733 Obstructive sleep apnea (adult) (pediatric): Secondary | ICD-10-CM | POA: Insufficient documentation

## 2015-02-26 DIAGNOSIS — M25511 Pain in right shoulder: Secondary | ICD-10-CM | POA: Diagnosis present

## 2015-02-26 DIAGNOSIS — Y9389 Activity, other specified: Secondary | ICD-10-CM | POA: Insufficient documentation

## 2015-02-26 HISTORY — DX: Myoneural disorder, unspecified: G70.9

## 2015-02-26 HISTORY — DX: Bipolar disorder, unspecified: F31.9

## 2015-02-26 HISTORY — PX: SHOULDER ARTHROSCOPY WITH DISTAL CLAVICLE RESECTION: SHX5675

## 2015-02-26 LAB — POCT HEMOGLOBIN-HEMACUE: Hemoglobin: 15.7 g/dL (ref 13.0–17.0)

## 2015-02-26 SURGERY — SHOULDER ARTHROSCOPY WITH DISTAL CLAVICLE RESECTION
Anesthesia: General | Site: Shoulder | Laterality: Right

## 2015-02-26 MED ORDER — LIDOCAINE HCL (CARDIAC) 10 MG/ML IV SOLN
INTRAVENOUS | Status: DC | PRN
  Administered 2015-02-26: 50 mg via INTRAVENOUS

## 2015-02-26 MED ORDER — HYDROMORPHONE HCL 1 MG/ML IJ SOLN: 0.2500 mg | INTRAMUSCULAR | Status: DC | PRN

## 2015-02-26 MED ORDER — POVIDONE-IODINE 7.5 % EX SOLN: Freq: Once | CUTANEOUS | Status: DC

## 2015-02-26 MED ORDER — OXYCODONE-ACETAMINOPHEN 5-325 MG PO TABS: 1.0000 | ORAL_TABLET | ORAL | Status: DC | PRN

## 2015-02-26 MED ORDER — ONDANSETRON HCL 4 MG/2ML IJ SOLN
INTRAMUSCULAR | Status: DC | PRN
  Administered 2015-02-26: 4 mg via INTRAVENOUS

## 2015-02-26 MED ORDER — FENTANYL CITRATE (PF) 100 MCG/2ML IJ SOLN
INTRAMUSCULAR | Status: AC
  Filled 2015-02-26: qty 2

## 2015-02-26 MED ORDER — MIDAZOLAM HCL 2 MG/2ML IJ SOLN
INTRAMUSCULAR | Status: AC
  Filled 2015-02-26: qty 2

## 2015-02-26 MED ORDER — FENTANYL CITRATE (PF) 100 MCG/2ML IJ SOLN
50.0000 ug | INTRAMUSCULAR | Status: AC | PRN
  Administered 2015-02-26: 50 ug via INTRAVENOUS
  Administered 2015-02-26: 25 ug via INTRAVENOUS
  Administered 2015-02-26: 100 ug via INTRAVENOUS
  Administered 2015-02-26: 25 ug via INTRAVENOUS

## 2015-02-26 MED ORDER — SCOPOLAMINE 1 MG/3DAYS TD PT72: 1.0000 | MEDICATED_PATCH | Freq: Once | TRANSDERMAL | Status: DC | PRN

## 2015-02-26 MED ORDER — FENTANYL CITRATE (PF) 100 MCG/2ML IJ SOLN
INTRAMUSCULAR | Status: AC
  Filled 2015-02-26: qty 8

## 2015-02-26 MED ORDER — CEFAZOLIN SODIUM-DEXTROSE 2-3 GM-% IV SOLR
2.0000 g | INTRAVENOUS | Status: AC
  Administered 2015-02-26: 2 g via INTRAVENOUS

## 2015-02-26 MED ORDER — MEPERIDINE HCL 25 MG/ML IJ SOLN: 6.2500 mg | INTRAMUSCULAR | Status: DC | PRN

## 2015-02-26 MED ORDER — SUCCINYLCHOLINE CHLORIDE 20 MG/ML IJ SOLN
INTRAMUSCULAR | Status: DC | PRN
  Administered 2015-02-26: 100 mg via INTRAVENOUS

## 2015-02-26 MED ORDER — DEXAMETHASONE SODIUM PHOSPHATE 4 MG/ML IJ SOLN
INTRAMUSCULAR | Status: DC | PRN
  Administered 2015-02-26: 10 mg via INTRAVENOUS

## 2015-02-26 MED ORDER — LACTATED RINGERS IV SOLN
INTRAVENOUS | Status: DC
  Administered 2015-02-26 (×2): via INTRAVENOUS

## 2015-02-26 MED ORDER — ONDANSETRON HCL 4 MG/2ML IJ SOLN: 4.0000 mg | Freq: Once | INTRAMUSCULAR | Status: DC | PRN

## 2015-02-26 MED ORDER — MIDAZOLAM HCL 2 MG/2ML IJ SOLN
1.0000 mg | INTRAMUSCULAR | Status: DC | PRN
Start: 2015-02-26 — End: 2015-02-26
  Administered 2015-02-26: 2 mg via INTRAVENOUS

## 2015-02-26 MED ORDER — SODIUM CHLORIDE 0.9 % IR SOLN: Status: DC | PRN

## 2015-02-26 MED ORDER — GLYCOPYRROLATE 0.2 MG/ML IJ SOLN: 0.2000 mg | Freq: Once | INTRAMUSCULAR | Status: DC | PRN

## 2015-02-26 MED ORDER — EPHEDRINE SULFATE 50 MG/ML IJ SOLN
INTRAMUSCULAR | Status: DC | PRN
  Administered 2015-02-26: 10 mg via INTRAVENOUS

## 2015-02-26 MED ORDER — PROPOFOL 10 MG/ML IV BOLUS
INTRAVENOUS | Status: DC | PRN
  Administered 2015-02-26: 200 mg via INTRAVENOUS

## 2015-02-26 MED ORDER — CEFAZOLIN SODIUM-DEXTROSE 2-3 GM-% IV SOLR
INTRAVENOUS | Status: AC
  Filled 2015-02-26: qty 50

## 2015-02-26 MED ORDER — DOCUSATE SODIUM 100 MG PO CAPS: 100.0000 mg | ORAL_CAPSULE | Freq: Three times a day (TID) | ORAL | Status: DC | PRN

## 2015-02-26 SURGICAL SUPPLY — 78 items
BENZOIN TINCTURE PRP APPL 2/3 (GAUZE/BANDAGES/DRESSINGS) IMPLANT
BLADE AVERAGE 25X9 (BLADE) ×2 IMPLANT
BLADE CLIPPER SURG (BLADE) IMPLANT
BLADE SURG 15 STRL LF DISP TIS (BLADE) ×2 IMPLANT
BLADE SURG 15 STRL SS (BLADE) ×2
BUR OVAL 4.0 (BURR) ×2 IMPLANT
CANNULA 5.75X71 LONG (CANNULA) ×2 IMPLANT
CANNULA TWIST IN 8.25X7CM (CANNULA) IMPLANT
CHLORAPREP W/TINT 26ML (MISCELLANEOUS) ×2 IMPLANT
DECANTER SPIKE VIAL GLASS SM (MISCELLANEOUS) IMPLANT
DRAPE INCISE IOBAN 66X45 STRL (DRAPES) ×2 IMPLANT
DRAPE STERI 35X30 U-POUCH (DRAPES) ×2 IMPLANT
DRAPE SURG 17X23 STRL (DRAPES) ×2 IMPLANT
DRAPE U 20/CS (DRAPES) ×2 IMPLANT
DRAPE U-SHAPE 47X51 STRL (DRAPES) ×2 IMPLANT
DRAPE U-SHAPE 76X120 STRL (DRAPES) ×4 IMPLANT
DRSG PAD ABDOMINAL 8X10 ST (GAUZE/BANDAGES/DRESSINGS) ×2 IMPLANT
ELECT REM PT RETURN 9FT ADLT (ELECTROSURGICAL) ×2
ELECTRODE REM PT RTRN 9FT ADLT (ELECTROSURGICAL) ×1 IMPLANT
GAUZE SPONGE 4X4 12PLY STRL (GAUZE/BANDAGES/DRESSINGS) ×2 IMPLANT
GAUZE SPONGE 4X4 16PLY XRAY LF (GAUZE/BANDAGES/DRESSINGS) IMPLANT
GAUZE XEROFORM 1X8 LF (GAUZE/BANDAGES/DRESSINGS) ×2 IMPLANT
GLOVE BIO SURGEON STRL SZ7 (GLOVE) ×4 IMPLANT
GLOVE BIO SURGEON STRL SZ7.5 (GLOVE) ×4 IMPLANT
GLOVE BIOGEL PI IND STRL 7.0 (GLOVE) ×1 IMPLANT
GLOVE BIOGEL PI IND STRL 8 (GLOVE) ×2 IMPLANT
GLOVE BIOGEL PI INDICATOR 7.0 (GLOVE) ×1
GLOVE BIOGEL PI INDICATOR 8 (GLOVE) ×2
GOWN STRL REUS W/ TWL LRG LVL3 (GOWN DISPOSABLE) ×2 IMPLANT
GOWN STRL REUS W/ TWL XL LVL3 (GOWN DISPOSABLE) ×1 IMPLANT
GOWN STRL REUS W/TWL LRG LVL3 (GOWN DISPOSABLE) ×2
GOWN STRL REUS W/TWL XL LVL3 (GOWN DISPOSABLE) ×1
LASSO CRESCENT QUICKPASS (SUTURE) IMPLANT
LIQUID BAND (GAUZE/BANDAGES/DRESSINGS) IMPLANT
MANIFOLD NEPTUNE II (INSTRUMENTS) ×2 IMPLANT
NDL SUT 6 .5 CRC .975X.05 MAYO (NEEDLE) IMPLANT
NEEDLE 1/2 CIR CATGUT .05X1.09 (NEEDLE) IMPLANT
NEEDLE MAYO TAPER (NEEDLE)
NEEDLE SCORPION MULTI FIRE (NEEDLE) IMPLANT
NS IRRIG 1000ML POUR BTL (IV SOLUTION) IMPLANT
PACK ARTHROSCOPY DSU (CUSTOM PROCEDURE TRAY) ×2 IMPLANT
PACK BASIN DAY SURGERY FS (CUSTOM PROCEDURE TRAY) ×2 IMPLANT
PENCIL BUTTON HOLSTER BLD 10FT (ELECTRODE) IMPLANT
RESECTOR FULL RADIUS 4.2MM (BLADE) ×2 IMPLANT
SHEET MEDIUM DRAPE 40X70 STRL (DRAPES) IMPLANT
SLEEVE SCD COMPRESS KNEE MED (MISCELLANEOUS) ×2 IMPLANT
SLING ARM IMMOBILIZER MED (SOFTGOODS) IMPLANT
SLING ARM LRG ADULT FOAM STRAP (SOFTGOODS) IMPLANT
SLING ARM MED ADULT FOAM STRAP (SOFTGOODS) IMPLANT
SLING ARM XL FOAM STRAP (SOFTGOODS) IMPLANT
SPONGE LAP 4X18 X RAY DECT (DISPOSABLE) ×2 IMPLANT
STRIP CLOSURE SKIN 1/2X4 (GAUZE/BANDAGES/DRESSINGS) IMPLANT
SUCTION FRAZIER TIP 10 FR DISP (SUCTIONS) ×2 IMPLANT
SUPPORT WRAP ARM LG (MISCELLANEOUS) IMPLANT
SUT BONE WAX W31G (SUTURE) IMPLANT
SUT ETHIBOND 2 OS 4 DA (SUTURE) IMPLANT
SUT ETHILON 3 0 PS 1 (SUTURE) IMPLANT
SUT ETHILON 4 0 PS 2 18 (SUTURE) IMPLANT
SUT FIBERWIRE #2 38 T-5 BLUE (SUTURE)
SUT MNCRL AB 3-0 PS2 18 (SUTURE) IMPLANT
SUT MNCRL AB 4-0 PS2 18 (SUTURE) IMPLANT
SUT PDS AB 0 CT 36 (SUTURE) IMPLANT
SUT PROLENE 3 0 PS 2 (SUTURE) IMPLANT
SUT TIGER TAPE 7 IN WHITE (SUTURE) IMPLANT
SUT VIC AB 0 CT1 27 (SUTURE)
SUT VIC AB 0 CT1 27XBRD ANBCTR (SUTURE) IMPLANT
SUT VIC AB 2-0 SH 27 (SUTURE)
SUT VIC AB 2-0 SH 27XBRD (SUTURE) IMPLANT
SUTURE FIBERWR #2 38 T-5 BLUE (SUTURE) IMPLANT
SYR BULB 3OZ (MISCELLANEOUS) IMPLANT
TAPE FIBER 2MM 7IN #2 BLUE (SUTURE) IMPLANT
TOWEL OR 17X24 6PK STRL BLUE (TOWEL DISPOSABLE) ×2 IMPLANT
TOWEL OR NON WOVEN STRL DISP B (DISPOSABLE) ×2 IMPLANT
TUBE CONNECTING 20X1/4 (TUBING) ×2 IMPLANT
TUBING ARTHROSCOPY IRRIG 16FT (MISCELLANEOUS) ×2 IMPLANT
WAND STAR VAC 90 (SURGICAL WAND) ×2 IMPLANT
WATER STERILE IRR 1000ML POUR (IV SOLUTION) ×2 IMPLANT
YANKAUER SUCT BULB TIP NO VENT (SUCTIONS) ×2 IMPLANT

## 2015-02-26 NOTE — Anesthesia Procedure Notes (Addendum)
Procedure Name: Intubation Performed by: Terrance Mass Pre-anesthesia Checklist: Patient identified, Timeout performed, Emergency Drugs available, Suction available and Patient being monitored Patient Re-evaluated:Patient Re-evaluated prior to inductionOxygen Delivery Method: Circle system utilized Preoxygenation: Pre-oxygenation with 100% oxygen Intubation Type: IV induction Ventilation: Mask ventilation without difficulty Laryngoscope Size: Miller and 2 Grade View: Grade II Tube type: Oral Tube size: 7.0 mm Number of attempts: 1 Airway Equipment and Method: Stylet Secured at: 22 cm Tube secured with: Tape Dental Injury: Teeth and Oropharynx as per pre-operative assessment

## 2015-02-26 NOTE — Discharge Instructions (Signed)
Discharge Instructions after Arthroscopic Shoulder Surgery ° ° °A sling has been provided for you. You may remove the sling after 72 hours. The sling may be worn for your protection, if you are in a crowd.  °Use ice on the shoulder intermittently over the first 48 hours after surgery.  °Pain medication has been prescribed for you.  °Use your medication liberally over the first 48 hours, and then begin to taper your use. You may take Extra Strength Tylenol or Tylenol only in place of the pain pills. DO NOT take ANY nonsteroidal anti-inflammatory pain medications: Advil, Motrin, Ibuprofen, Aleve, Naproxen, or Naprosyn.  °You may remove your dressing after two days.  °You may shower 5 days after surgery. The incision CANNOT get wet prior to 5 days. Simply allow the water to wash over the site and then pat dry. Do not rub the incision. Make sure your axilla (armpit) is completely dry after showering.  °Take one aspirin a day for 2 weeks after surgery, unless you have an aspirin sensitivity/allergy or asthma.  °Three to 5 times each day you should perform assisted overhead reaching and external rotation (outward turning) exercises with the operative arm. Both exercises should be done with the non-operative arm used as the "therapist arm" while the operative arm remains relaxed. Ten of each exercise should be done three to five times each day. ° ° ° °Overhead reach is helping to lift your stiff arm up as high as it will go. To stretch your overhead reach, lie flat on your back, relax, and grasp the wrist of the tight shoulder with your opposite hand. Using the power in your opposite arm, bring the stiff arm up as far as it is comfortable. Start holding it for ten seconds and then work up to where you can hold it for a count of 30. Breathe slowly and deeply while the arm is moved. Repeat this stretch ten times, trying to help the arm up a little higher each time.  ° ° ° ° ° °External rotation is turning the arm out to  the side while your elbow stays close to your body. External rotation is best stretched while you are lying on your back. Hold a cane, yardstick, broom handle, or dowel in both hands. Bend both elbows to a right angle. Use steady, gentle force from your normal arm to rotate the hand of the stiff shoulder out away from your body. Continue the rotation as far as it will go comfortably, holding it there for a count of 10. Repeat this exercise ten times.  ° ° ° °Please call [phone] during normal business hours or [phone] after hours for any problems. Including the following: ° °- excessive redness of the incisions °- drainage for more than 4 days °- fever of more than 101.5 F ° °*Please note that pain medications will not be refilled after hours or on weekends. ° ° ° °Post Anesthesia Home Care Instructions ° °Activity: °Get plenty of rest for the remainder of the day. A responsible adult should stay with you for 24 hours following the procedure.  °For the next 24 hours, DO NOT: °-Drive a car °-Operate machinery °-Drink alcoholic beverages °-Take any medication unless instructed by your physician °-Make any legal decisions or sign important papers. ° °Meals: °Start with liquid foods such as gelatin or soup. Progress to regular foods as tolerated. Avoid greasy, spicy, heavy foods. If nausea and/or vomiting occur, drink only clear liquids until the nausea and/or vomiting subsides. Call   your physician if vomiting continues. ° °Special Instructions/Symptoms: °Your throat may feel dry or sore from the anesthesia or the breathing tube placed in your throat during surgery. If this causes discomfort, gargle with warm salt water. The discomfort should disappear within 24 hours. ° °If you had a scopolamine patch placed behind your ear for the management of post- operative nausea and/or vomiting: ° °1. The medication in the patch is effective for 72 hours, after which it should be removed.  Wrap patch in a tissue and  discard in the trash. Wash hands thoroughly with soap and water. °2. You may remove the patch earlier than 72 hours if you experience unpleasant side effects which may include dry mouth, dizziness or visual disturbances. °3. Avoid touching the patch. Wash your hands with soap and water after contact with the patch. ° ° °  °Regional Anesthesia Blocks ° °1. Numbness or the inability to move the "blocked" extremity may last from 3-48 hours after placement. The length of time depends on the medication injected and your individual response to the medication. If the numbness is not going away after 48 hours, call your surgeon. ° °2. The extremity that is blocked will need to be protected until the numbness is gone and the  Strength has returned. Because you cannot feel it, you will need to take extra care to avoid injury. Because it may be weak, you may have difficulty moving it or using it. You may not know what position it is in without looking at it while the block is in effect. ° °3. For blocks in the legs and feet, returning to weight bearing and walking needs to be done carefully. You will need to wait until the numbness is entirely gone and the strength has returned. You should be able to move your leg and foot normally before you try and bear weight or walk. You will need someone to be with you when you first try to ensure you do not fall and possibly risk injury. ° °4. Bruising and tenderness at the needle site are common side effects and will resolve in a few days. ° °5. Persistent numbness or new problems with movement should be communicated to the surgeon or the [facility] ([phone])/ [facility] ([phone]). °

## 2015-02-26 NOTE — Anesthesia Preprocedure Evaluation (Signed)
Anesthesia Evaluation  Patient identified by MRN, date of birth, ID band Patient awake    Reviewed: Allergy & Precautions, NPO status , Patient's Chart, lab work & pertinent test results  Airway Mallampati: I  TM Distance: >3 FB Neck ROM: Full    Dental   Pulmonary Current Smoker,    Pulmonary exam normal       Cardiovascular Normal cardiovascular exam    Neuro/Psych    GI/Hepatic GERD-  Medicated and Controlled,  Endo/Other    Renal/GU      Musculoskeletal   Abdominal   Peds  Hematology   Anesthesia Other Findings   Reproductive/Obstetrics                             Anesthesia Physical Anesthesia Plan  ASA: II  Anesthesia Plan: General   Post-op Pain Management:    Induction: Intravenous  Airway Management Planned: Oral ETT  Additional Equipment:   Intra-op Plan:   Post-operative Plan: Extubation in OR  Informed Consent: I have reviewed the patients History and Physical, chart, labs and discussed the procedure including the risks, benefits and alternatives for the proposed anesthesia with the patient or authorized representative who has indicated his/her understanding and acceptance.     Plan Discussed with: CRNA and Surgeon  Anesthesia Plan Comments:         Anesthesia Quick Evaluation

## 2015-02-26 NOTE — H&P (Signed)
Sean Wu is an 44 y.o. male.   Chief Complaint: R shoulder pain  HPI: R shoulder AC arthropathy, failed conservative management.  Past Medical History  Diagnosis Date  . TESTICULAR HYPOFUNCTION 06/12/2009  . SMOKER 06/12/2009  . DEPRESSION 05/10/2009  . ALLERGIC RHINITIS 05/10/2009  . GERD 05/10/2009  . WEIGHT GAIN 05/10/2009  . LIBIDO, DECREASED 05/10/2009  . Osgood-Schlatter's disease   . Anxiety   . Chronic headaches   . Gastroparesis   . DDD (degenerative disc disease)   . SLEEP APNEA, OBSTRUCTIVE 06/12/2009    CPAP not utilized after 30 lb. weight loss  . Bipolar disorder     see medications patient to bring  . Neuromuscular disorder     Past Surgical History  Procedure Laterality Date  . Lasik Bilateral   . Ankle surgery Left   . Colonoscopy  2014    normal  . Eye surgery      eye implant  . Fracture surgery      surgery on right ankle for ligament torn 2013    Family History  Problem Relation Age of Onset  . Heart disease Maternal Grandfather   . Irritable bowel syndrome Father   . Alcoholism Father   . Heart attack Father   . Colon cancer Neg Hx   . Rectal cancer Neg Hx   . Stomach cancer Neg Hx    Social History:  reports that he has been smoking Cigarettes.  He has a 3.75 pack-year smoking history. He quit smokeless tobacco use about 16 years ago. His smokeless tobacco use included Chew. He reports that he drinks about 0.6 oz of alcohol per week. He reports that he does not use illicit drugs.  Allergies: No Known Allergies  Medications Prior to Admission  Medication Sig Dispense Refill  . ALPRAZolam (XANAX) 1 MG tablet Take 1 mg by mouth 1 day or 1 dose.     . Asenapine Maleate (SAPHRIS) 10 MG SUBL Place 20 mg under the tongue daily.    Hinda Kehr 30 MG/ACT SOLN APPLY 2 PUMPS EVERY MORNING 90 mL 2  . baclofen (LIORESAL) 10 MG tablet TAKE 1/2 TO 1 TAB UP TO TWICE A DAY AS NEEDED FOR HEADACHE *LIMIT TO 2 DAYS PER WEEK.AVOID DAILY USE  0  . diphenhydrAMINE  (BENADRYL) 25 mg capsule Take 50 mg by mouth every 6 (six) hours as needed.    . lamoTRIgine (LAMICTAL) 150 MG tablet Take 300 mg by mouth daily.     Marland Kitchen omeprazole (PRILOSEC) 20 MG capsule TAKE 1 CAPSULE (20 MG TOTAL) BY MOUTH DAILY. 90 capsule 2  . ROZEREM 8 MG tablet Take 8 mg by mouth at bedtime.  12  . topiramate (TOPAMAX) 100 MG tablet Take 100 mg by mouth daily.  1  . topiramate (TOPAMAX) 50 MG tablet     . AMITIZA 8 MCG capsule TAKE 1 CAPSULE (8 MCG TOTAL) BY MOUTH 2 (TWO) TIMES DAILY WITH A MEAL. 60 capsule 2  . ondansetron (ZOFRAN ODT) 8 MG disintegrating tablet Take 1 tablet (8 mg total) by mouth every 8 (eight) hours as needed for nausea or vomiting. 30 tablet 3  . prochlorperazine (COMPAZINE) 10 MG tablet Take 10 mg by mouth every 6 (six) hours as needed.  2  . zolpidem (AMBIEN) 10 MG tablet Take 10 mg by mouth at bedtime as needed for sleep.       Results for orders placed or performed during the hospital encounter of 02/26/15 (from the past  48 hour(s))  Hemoglobin-hemacue, POC     Status: None   Collection Time: 02/26/15 10:32 AM  Result Value Ref Range   Hemoglobin 15.7 13.0 - 17.0 g/dL   Mr Sean Wu Wo Contrast  02/25/2015   CLINICAL DATA:  Daily headaches for 2 weeks. Confusion, memory loss, and weakness.  EXAM: MRI HEAD WITHOUT AND WITH CONTRAST  TECHNIQUE: Multiplanar, multiecho pulse sequences of the brain and surrounding structures were obtained without and with intravenous contrast.  CONTRAST:  45mL MULTIHANCE GADOBENATE DIMEGLUMINE 529 MG/ML IV SOLN  COMPARISON:  Head CT 08/09/2014  FINDINGS: There is no evidence of acute infarct, intracranial hemorrhage, mass, midline shift, or extra-axial fluid collection. Ventricles and sulci are normal. Punctate foci of T2 hyperintensity are present in the subcortical white matter of the frontal lobes. No abnormal enhancement is identified.  Orbits are unremarkable. Paranasal sinuses and mastoid air cells are clear. Major intracranial  vascular flow voids are preserved.  IMPRESSION: 1. No acute intracranial abnormality or mass. 2. Minimal white matter disease in the frontal lobes, nonspecific. Considerations include early chronic small vessel ischemia, migraines, sequelae of trauma, hypercoagulable state, vasculitis, prior infection or less likely demyelination.   Electronically Signed   By: Sean Wu   On: 02/25/2015 15:28   Mr Lumbar Spine Wo Contrast  02/25/2015   CLINICAL DATA:  Left-sided low back pain. Occasional left lateral thigh pain.  EXAM: MRI LUMBAR SPINE WITHOUT CONTRAST  TECHNIQUE: Multiplanar, multisequence MR imaging of the lumbar spine was performed. No intravenous contrast was administered.  COMPARISON:  Abdominal radiograph dated 05/15/2014 and lumbar MRI dated 06/23/2013  FINDINGS: Normal conus tip at L2. The patient has developed atrophy of the left posterior paraspinal muscles since the prior MRI. The atrophy begins at the mid L3 level and extends to at least the S3 level. This is new since the prior MRI. Review of that study suggests that there was very subtle edema in the paraspinal musculature including the right multifidus muscle but the the area of atrophy on the current exam is less extensive in the edema on the prior exam.  The posterior paraspinal musculature is innervated by a dorsal branch of the spinal nerves. However, there is no evidence of a mass or of of fracture or facet joint hypertrophy or other abnormality to explain the muscle atrophy.  T11-12 through L1-2:  Normal.  L2-3: Tiny broad-based disc bulge with no neural impingement, unchanged.  L3-4 and L4-5:  Normal.  L5-S1: Small central subligamentous disc protrusion with no neural impingement, unchanged since the prior study.  IMPRESSION: 1. New fatty atrophy of the left posterior paraspinal musculature from L3 through S3 indicating denervation. The possibility of that the patient had localized myositis should be considered. Has the patient ever had  facet injections or epidural injections? 2. Chronic small central disc protrusion at L5-S1 with no neural impingement.   Electronically Signed   By: Sean Wu M.D.   On: 02/25/2015 15:34    Review of Systems  All other systems reviewed and are negative.   Blood pressure 103/56, pulse 78, temperature 97.8 F (36.6 C), temperature source Oral, resp. rate 11, height 5\' 9"  (1.753 m), weight 90.266 kg (199 lb), SpO2 96 %. Physical Exam  Constitutional: He is oriented to person, place, and time. He appears well-developed and well-nourished.  HENT:  Head: Atraumatic.  Eyes: EOM are normal.  Cardiovascular: Intact distal pulses.   Respiratory: Effort normal.  Musculoskeletal:  R shoulder TTP at [facility] joint  Neurological: He is alert and oriented to person, place, and time.  Skin: Skin is warm and dry.  Psychiatric: He has a normal mood and affect.     Assessment/Plan R shoulder AC arthropathy Plan R diagnostic arthroscopy with open distal clavicle excision. Risks / benefits of surgery discussed Consent on chart  NPO for OR Preop antibiotics   Raeshawn Vo WILLIAM 02/26/2015, 11:16 AM

## 2015-02-26 NOTE — Transfer of Care (Signed)
Immediate Anesthesia Transfer of Care Note  Patient: Sean Wu  Procedure(s) Performed: Procedure(s) with comments: SHOULDER DIAGNOSTIC ARTHROSCOPY WITH OPEN DISTAL CLAVICLE EXCISION (Right) - Right diagnostic arthroscopy, open distal clavical excision  Patient Location: PACU  Anesthesia Type:GA combined with regional for post-op pain  Level of Consciousness: awake, sedated and patient cooperative  Airway & Oxygen Therapy: Patient Spontanous Breathing and Patient connected to face mask oxygen  Post-op Assessment: Report given to RN and Post -op Vital signs reviewed and stable  Post vital signs: Reviewed and stable  Last Vitals:  Filed Vitals:   02/26/15 1059  BP:   Pulse: 78  Temp:   Resp: 11    Complications: No apparent anesthesia complications

## 2015-02-26 NOTE — Anesthesia Postprocedure Evaluation (Signed)
Anesthesia Post Note  Patient: Sean Wu  Procedure(s) Performed: Procedure(s) (LRB): SHOULDER DIAGNOSTIC ARTHROSCOPY WITH OPEN DISTAL CLAVICLE EXCISION (Right)  Anesthesia type: general  Patient location: PACU  Post pain: Pain level controlled  Post assessment: Patient's Cardiovascular Status Stable  Last Vitals:  Filed Vitals:   02/26/15 1338  BP: 114/68  Pulse: 73  Temp: 36.4 C  Resp: 18    Post vital signs: Reviewed and stable  Level of consciousness: sedated  Complications: No apparent anesthesia complications

## 2015-02-26 NOTE — Op Note (Signed)
Procedure(s):  Procedure Note  Sean Wu male 44 y.o. 02/26/2015  Procedure(s) and Anesthesia Type: #1 right shoulder arthroscopic extensive debridement superior and posterior labral tears, bursectomy #2 right shoulder open distal clavicle excision    Surgeon(s) and Role:    * Sean Ade, MD - Primary     Surgeon: Sean Wu   Assistants: Jeanmarie Hubert PA-C (Danielle was present and scrubbed throughout the procedure and was essential in positioning, retraction, exposure, and closure)  Anesthesia: General endotracheal anesthesia with preoperative interscalene block given by the attending anesthesiologist    Procedure Detail    Estimated Blood Loss: Min         Drains: none  Blood Given: none         Specimens: none        Complications:  * No complications entered in OR log *         Disposition: PACU - hemodynamically stable.         Condition: stable    Procedure:   INDICATIONS FOR SURGERY: The patient is 43 y.o. male who  has had a long history of right shoulder pain with significant before meals joint DJD. Pain was temporarily relieved with an intra-articular before meals joint injection. Ultimately indicated for surgery to try and decrease pain and restore function.  OPERATIVE FINDINGS: Examination under anesthesia:  No stiffness or instability  DESCRIPTION OF PROCEDURE: The patient was identified in preoperative  holding area where I personally marked the operative site after  verifying site, side, and procedure with the patient. An interscalene block was given by the attending anesthesiologist the holding area.  The patient was taken back to the operating room where general anesthesia was induced without complication and was placed in the beach-chair position with the back  elevated about 60 degrees and all extremities and head and neck carefully padded and  positioned.   The right upper extremity was then prepped and  draped  in a standard sterile fashion. The appropriate time-out  procedure was carried out. The patient did receive IV antibiotics  within 30 minutes of incision.   A small posterior portal incision was made and the arthroscope was introduced into the joint. An anterior portal was then established above the subscapularis using needle localization. Small cannula was placed anteriorly. Diagnostic arthroscopy was then carried out.  He was noted to have intact rotator cuff anterior superior and posterior. Glenohumeral joint surfaces were intact. Biceps tendon was pulled into the joint and was intact without heme synovitis. He did have extensive tearing of the superior labrum extending around to the entire posterior labrum as well. There are multiple delaminated areas with flaps into the joint. This was extensively debrided with a shaver. There was some extension into the biceps origin but not complete detachment. Given the fact that he had good relief of his pain with the acromioclavicular joint injection I did not feel that treating his SLAP tear aggressively would be necessary simple debridement should be appropriate. Therefore did not feel that tenotomy and tenodesis was necessary.  The arthroscope was then introduced into the subacromial space a standard lateral portal was established with needle localization. The shaver was used through the lateral portal to perform extensive bursectomy. Coracoacromial ligament was examined and found to be  intact without fraying or signs of significant impingement..  This point the arthroscopic equipment was removed from the joint and attention was turned to the before meals joint dorsally where a approximately 3 cm incision was made  obliquely. Dissection was carried down through subcutaneous tissues and small skin flaps were developed. The joint was then opened longitudinally and the small bony free fragment superiorly was identified and removed. Once the distal clavicle was  exposed small retractors were placed anterior and posterior and a small oscillating saw was used to resect about 8-10 mm the distal clavicle. The joint remained stable. Digital palpation was used to ensure no further impingement. At this point to joint and wounds were copiously irrigated and subsequent closed in layers with 0 Vicryls to close the dorsal capsule to the Vicryls and 4-0 Monocryl for skin closure. Steri-Strips were applied and the portals were closed with 3-0 nylon in an interrupted fashion. Sterile dressings were then applied including Xeroform 4 x 4's ABDs and tape. The patient was then allowed to awaken from general anesthesia, placed in a sling, transferred to the stretcher and taken to the recovery room in stable condition.   POSTOPERATIVE PLAN: The patient will be discharged home today and will followup in one week for suture removal and wound check. He can begin light stretching exercises when comfortable.

## 2015-02-26 NOTE — Progress Notes (Signed)
Assisted Dr. Ossey with right, ultrasound guided, interscalene  block. Side rails up, monitors on throughout procedure. See vital signs in flow sheet. Tolerated Procedure well. 

## 2015-02-27 ENCOUNTER — Encounter (HOSPITAL_BASED_OUTPATIENT_CLINIC_OR_DEPARTMENT_OTHER): Payer: Self-pay | Admitting: Orthopedic Surgery

## 2015-03-07 ENCOUNTER — Other Ambulatory Visit: Payer: Self-pay | Admitting: Orthopedic Surgery

## 2015-03-07 DIAGNOSIS — G8929 Other chronic pain: Secondary | ICD-10-CM

## 2015-03-07 DIAGNOSIS — M533 Sacrococcygeal disorders, not elsewhere classified: Principal | ICD-10-CM

## 2015-03-19 ENCOUNTER — Ambulatory Visit: Payer: 59 | Admitting: Neurology

## 2015-03-20 ENCOUNTER — Ambulatory Visit: Payer: 59 | Admitting: Neurology

## 2015-03-26 ENCOUNTER — Other Ambulatory Visit: Payer: Self-pay | Admitting: Internal Medicine

## 2015-03-29 ENCOUNTER — Other Ambulatory Visit: Payer: Self-pay | Admitting: Internal Medicine

## 2015-03-30 ENCOUNTER — Encounter: Payer: Self-pay | Admitting: Internal Medicine

## 2015-03-30 ENCOUNTER — Ambulatory Visit (INDEPENDENT_AMBULATORY_CARE_PROVIDER_SITE_OTHER): Payer: 59 | Admitting: Internal Medicine

## 2015-03-30 VITALS — BP 134/90 | HR 80 | Temp 98.1°F | Resp 20 | Ht 68.0 in | Wt 206.0 lb

## 2015-03-30 DIAGNOSIS — G4733 Obstructive sleep apnea (adult) (pediatric): Secondary | ICD-10-CM | POA: Diagnosis not present

## 2015-03-30 DIAGNOSIS — F319 Bipolar disorder, unspecified: Secondary | ICD-10-CM

## 2015-03-30 DIAGNOSIS — R6882 Decreased libido: Secondary | ICD-10-CM

## 2015-03-30 DIAGNOSIS — Z9989 Dependence on other enabling machines and devices: Secondary | ICD-10-CM

## 2015-03-30 DIAGNOSIS — F3130 Bipolar disorder, current episode depressed, mild or moderate severity, unspecified: Secondary | ICD-10-CM

## 2015-03-30 LAB — TESTOSTERONE: TESTOSTERONE: 312.95 ng/dL (ref 300.00–890.00)

## 2015-03-30 NOTE — Progress Notes (Signed)
Subjective:    Patient ID: Sean Wu, male    DOB: Oct 05, 1970, 44 y.o.   MRN: [id number]  HPI  44 year old patient who presents today with a chief complaint of decreased libido.  This has been a chronic complaint in the past.  He states that he has not had sexual relations with his wife in over one year.  This is causing some marital discord.  He does have a history of testosterone deficiency and has been on his supplements faithfully. He has a history of bipolar depression and also severe anxiety.  He is on a number of psychoactive medications including chronic benzodiazepine use.  He has been followed by psychiatry, but now states that he is having a difficult time finding a psychiatrist that takes his health plan. He has a history of OSA, but has discontinued CPAP use.  After a history of weight loss.  More recently, he has regained weight.  He feels chronically fatigued  Past Medical History  Diagnosis Date  . TESTICULAR HYPOFUNCTION 06/12/2009  . SMOKER 06/12/2009  . DEPRESSION 05/10/2009  . ALLERGIC RHINITIS 05/10/2009  . GERD 05/10/2009  . WEIGHT GAIN 05/10/2009  . LIBIDO, DECREASED 05/10/2009  . Osgood-Schlatter's disease   . Anxiety   . Chronic headaches   . Gastroparesis   . DDD (degenerative disc disease)   . SLEEP APNEA, OBSTRUCTIVE 06/12/2009    CPAP not utilized after 30 lb. weight loss  . Bipolar disorder     see medications patient to bring  . Neuromuscular disorder     History   Social History  . Marital Status: Married    Spouse Name: N/A  . Number of Children: 0  . Years of Education: College   Occupational History  . Unemployeed    Social History Main Topics  . Smoking status: Current Every Day Smoker -- 0.25 packs/day for 15 years    Types: Cigarettes  . Smokeless tobacco: Former Systems developer    Types: [place] date: 09/01/1998     Comment: tobacco info given 08/01/13  . Alcohol Use: 0.6 oz/week    0 Standard drinks or equivalent, 1 Cans of beer per week       Comment: social  . Drug Use: No  . Sexual Activity: Yes   Other Topics Concern  . Not on file   Social History Narrative    Past Surgical History  Procedure Laterality Date  . Lasik Bilateral   . Ankle surgery Left   . Colonoscopy  2014    normal  . Eye surgery      eye implant  . Fracture surgery      surgery on right ankle for ligament torn 2013  . Shoulder arthroscopy with distal clavicle resection Right 02/26/2015    Procedure: SHOULDER DIAGNOSTIC ARTHROSCOPY WITH OPEN DISTAL CLAVICLE EXCISION;  Surgeon: Tania Ade, MD;  Location: [facility];  Service: Orthopedics;  Laterality: Right;  Right diagnostic arthroscopy, open distal clavical excision    Family History  Problem Relation Age of Onset  . Heart disease Maternal Grandfather   . Irritable bowel syndrome Father   . Alcoholism Father   . Heart attack Father   . Colon cancer Neg Hx   . Rectal cancer Neg Hx   . Stomach cancer Neg Hx     No Known Allergies  Current Outpatient Prescriptions on File Prior to Visit  Medication Sig Dispense Refill  . ALPRAZolam (XANAX) 1 MG tablet Take 1 mg  by mouth 1 day or 1 dose.     . Asenapine Maleate (SAPHRIS) 10 MG SUBL Place 20 mg under the tongue daily.    Hinda Kehr 30 MG/ACT SOLN APPLY 2 PUMPS EVERY MORNING 90 mL 2  . baclofen (LIORESAL) 10 MG tablet TAKE 1/2 TO 1 TAB UP TO TWICE A DAY AS NEEDED FOR HEADACHE *LIMIT TO 2 DAYS PER WEEK.AVOID DAILY USE  0  . diphenhydrAMINE (BENADRYL) 25 mg capsule Take 50 mg by mouth every 6 (six) hours as needed.    . lamoTRIgine (LAMICTAL) 150 MG tablet Take 300 mg by mouth daily.     Marland Kitchen omeprazole (PRILOSEC) 20 MG capsule TAKE 1 CAPSULE (20 MG TOTAL) BY MOUTH DAILY. 90 capsule 2  . traMADol (ULTRAM) 50 MG tablet TAKE 1 TABLET EVERY 8 HOURS AS NEEDED 60 tablet 2  . [DISCONTINUED] omeprazole (PRILOSEC OTC) 20 MG tablet 1 tablet twice daily. Do not eat for one hour after ingestion of the medication 60 tablet 11   No  current facility-administered medications on file prior to visit.    BP 134/90 mmHg  Pulse 80  Temp(Src) 98.1 F (36.7 C) (Oral)  Resp 20  Ht 5\' 8"  (1.727 m)  Wt 206 lb (93.441 kg)  BMI 31.33 kg/m2  SpO2 98%     Review of Systems  Constitutional: Negative for fever, chills, appetite change and fatigue.  HENT: Negative for congestion, dental problem, ear pain, hearing loss, sore throat, tinnitus, trouble swallowing and voice change.   Eyes: Negative for pain, discharge and visual disturbance.  Respiratory: Negative for cough, chest tightness, wheezing and stridor.   Cardiovascular: Negative for chest pain, palpitations and leg swelling.  Gastrointestinal: Negative for nausea, vomiting, abdominal pain, diarrhea, constipation, blood in stool and abdominal distention.  Genitourinary: Negative for urgency, hematuria, flank pain, discharge, difficulty urinating and genital sores.  Musculoskeletal: Negative for myalgias, back pain, joint swelling, arthralgias, gait problem and neck stiffness.  Skin: Negative for rash.  Neurological: Negative for dizziness, syncope, speech difficulty, weakness, numbness and headaches.  Hematological: Negative for adenopathy. Does not bruise/bleed easily.  Psychiatric/Behavioral: Positive for sleep disturbance, dysphoric mood and decreased concentration. Negative for behavioral problems. The patient is nervous/anxious.        Objective:   Physical Exam  Constitutional: He is oriented to person, place, and time. He appears well-developed.  HENT:  Head: Normocephalic.  Right Ear: External ear normal.  Left Ear: External ear normal.  Eyes: Conjunctivae and EOM are normal.  Neck: Normal range of motion.  Cardiovascular: Normal rate and normal heart sounds.   Pulmonary/Chest: Breath sounds normal.  Abdominal: Bowel sounds are normal.  Musculoskeletal: Normal range of motion. He exhibits no edema or tenderness.  Neurological: He is alert and oriented to  person, place, and time.  Psychiatric: He has a normal mood and affect. His behavior is normal.          Assessment & Plan:   Decreased libido.  Probable multi-factorial.  Will check a testosterone level and adjust supplement if needed Bipolar disorder Anxiety, depression OSA.  Patient encouraged to resume CPAP  Follow-up psychiatry

## 2015-03-30 NOTE — Patient Instructions (Signed)
Psychiatric follow-up as discussed  Resume CPAP    It is important that you exercise regularly, at least 20 minutes 3 to 4 times per week.  If you develop chest pain or shortness of breath seek  medical attention.  You need to lose weight.  Consider a lower calorie diet and regular exercise.

## 2015-03-30 NOTE — Progress Notes (Signed)
Pre visit review using our clinic review tool, if applicable. No additional management support is needed unless otherwise documented below in the visit note. 

## 2015-04-10 ENCOUNTER — Encounter: Payer: Self-pay | Admitting: Neurology

## 2015-04-10 ENCOUNTER — Ambulatory Visit (INDEPENDENT_AMBULATORY_CARE_PROVIDER_SITE_OTHER): Payer: 59 | Admitting: Neurology

## 2015-04-10 VITALS — BP 116/82 | HR 94 | Resp 16 | Ht 69.0 in | Wt 201.0 lb

## 2015-04-10 DIAGNOSIS — F3130 Bipolar disorder, current episode depressed, mild or moderate severity, unspecified: Secondary | ICD-10-CM | POA: Diagnosis not present

## 2015-04-10 DIAGNOSIS — F419 Anxiety disorder, unspecified: Secondary | ICD-10-CM

## 2015-04-10 DIAGNOSIS — F319 Bipolar disorder, unspecified: Secondary | ICD-10-CM

## 2015-04-10 DIAGNOSIS — R413 Other amnesia: Secondary | ICD-10-CM

## 2015-04-10 DIAGNOSIS — F4010 Social phobia, unspecified: Secondary | ICD-10-CM | POA: Insufficient documentation

## 2015-04-10 NOTE — Progress Notes (Signed)
NEUROLOGY FOLLOW UP OFFICE NOTE  Sean Wu [id number]  HISTORY OF PRESENT ILLNESS: I had the pleasure of seeing Sean Wu in follow-up in the neurology clinic on 04/10/2015.  The patient was last seen 3 months ago for worsening memory. MMSE at that time was normal 29/30. Records and images were personally reviewed where available.  TSH and B12 normal. I personally reviewed MRI brain without contrast which was normal. He reports that his memory is better, he still forgets where he is going sometimes, and a lot of times has no idea how to get somewhere, but he does not lose whole days anymore. He still forgets dates and what day of the week it is, as well as things his wife tells him. He denies any forgetting bill payments and remembers to take his medications. He continues to have sleep issues, with 5-6 hours of sleep using Sleep Aid. He feels tired all the time. He exercises daily, which helps. He continues to have near-daily headaches, and follows with headache specialist Dr. Domingo Cocking. He had seen sleep specialist Dr. Rexene Alberts. He is now off Topamax and Trazodone, and does feel his thinking is clearer.  He denies any diplopia, dysarthria, dysphagia, neck pain, focal numbness/tingling/weakness except his feet go to sleep frequently.   HPI: This is a 44 yo RH man who I had previously seen for back pain. He returned last May 2016 for evaluation of memory loss, referred by his headache specialist. He has been seeing neurologist Dr. Domingo Cocking for migraines. He reports memory changes started in the past year, however recently became more noticeable. He has been to Dr. Domingo Cocking twice, then on his third visit he missed his appointment, then could not recall that he had been there before. He forgets directions to places and has gotten lost driving. His wife has to tell him what he doesn't remember. He has missed doctors appointments and would not recall why he was in the store. He has left the stove on. He  became tearful during the visit and reports stumbling on words. He has to write everything now and keeps a calendar. He denies missing medications. He sees Dr. Toy Care for Bipolar disorder and has been taking Saphris and Lamictal for many years. He started Trazodone a year ago, which his wife felt was causing his symptoms. He stopped it 6 months ago but continues to have symptoms. He reports sleep is "horrible," he has difficulty maintaining sleep. He denies snoring, and stopped using his CPAP when he lost weight ("I can't stand it).   He has headaches almost daily, with migraines 10-15 times a month. He used to have them many years ago, but these recurred last year, and Topamax was started. He reports memory changes started before Topamax initiation. He has dizziness with the headaches and upon standing.   PAST MEDICAL HISTORY: Past Medical History  Diagnosis Date  . TESTICULAR HYPOFUNCTION 06/12/2009  . SMOKER 06/12/2009  . DEPRESSION 05/10/2009  . ALLERGIC RHINITIS 05/10/2009  . GERD 05/10/2009  . WEIGHT GAIN 05/10/2009  . LIBIDO, DECREASED 05/10/2009  . Osgood-Schlatter's disease   . Anxiety   . Chronic headaches   . Gastroparesis   . DDD (degenerative disc disease)   . SLEEP APNEA, OBSTRUCTIVE 06/12/2009    CPAP not utilized after 30 lb. weight loss  . Bipolar disorder     see medications patient to bring  . Neuromuscular disorder     MEDICATIONS: Current Outpatient Prescriptions on File Prior to Visit  Medication Sig  Dispense Refill  . ALPRAZolam (XANAX) 1 MG tablet Take 1 tablet as needed    . Asenapine Maleate (SAPHRIS) 10 MG SUBL Place 20 mg under the tongue daily.    Hinda Kehr 30 MG/ACT SOLN APPLY 2 PUMPS EVERY MORNING 90 mL 2  . baclofen (LIORESAL) 10 MG tablet TAKE 1/2 TO 1 TAB UP TO TWICE A DAY AS NEEDED FOR HEADACHE *LIMIT TO 2 DAYS PER WEEK.AVOID DAILY USE  0  . diphenhydrAMINE (BENADRYL) 25 mg capsule Take 50 mg by mouth every 6 (six) hours as needed.    . lamoTRIgine (LAMICTAL)  150 MG tablet Take 300 mg by mouth daily.     Marland Kitchen omeprazole (PRILOSEC) 20 MG capsule TAKE 1 CAPSULE (20 MG TOTAL) BY MOUTH DAILY. 90 capsule 2  . [DISCONTINUED] omeprazole (PRILOSEC OTC) 20 MG tablet 1 tablet twice daily. Do not eat for one hour after ingestion of the medication 60 tablet 11   No current facility-administered medications on file prior to visit.    ALLERGIES: No Known Allergies  FAMILY HISTORY: Family History  Problem Relation Age of Onset  . Heart disease Maternal Grandfather   . Irritable bowel syndrome Father   . Alcoholism Father   . Heart attack Father   . Colon cancer Neg Hx   . Rectal cancer Neg Hx   . Stomach cancer Neg Hx     SOCIAL HISTORY: History   Social History  . Marital Status: Married    Spouse Name: N/A  . Number of Children: 0  . Years of Education: College   Occupational History  . Unemployeed    Social History Main Topics  . Smoking status: Current Every Day Smoker -- 0.25 packs/day for 15 years    Types: Cigarettes  . Smokeless tobacco: Former Systems developer    Types: [place] date: 09/01/1998     Comment: tobacco info given 08/01/13  . Alcohol Use: 0.6 oz/week    0 Standard drinks or equivalent, 1 Cans of beer per week     Comment: social  . Drug Use: No  . Sexual Activity: Yes   Other Topics Concern  . Not on file   Social History Narrative    REVIEW OF SYSTEMS: Constitutional: No fevers, chills, or sweats, + generalized fatigue,no change in appetite Eyes: No visual changes, double vision, eye pain Ear, nose and throat: No hearing loss, ear pain, nasal congestion, sore throat Cardiovascular: No chest pain, palpitations Respiratory:  No shortness of breath at rest or with exertion, wheezes GastrointestinaI: No nausea, vomiting, diarrhea, abdominal pain, fecal incontinence Genitourinary:  No dysuria, urinary retention or frequency Musculoskeletal:  No neck pain, back pain Integumentary: No rash, pruritus, skin  lesions Neurological: as above Psychiatric: + depression, insomnia, anxiety Endocrine: No palpitations, +fatigue, no diaphoresis, mood swings, change in appetite, change in weight, increased thirst Hematologic/Lymphatic:  No anemia, purpura, petechiae. Allergic/Immunologic: no itchy/runny eyes, nasal congestion, recent allergic reactions, rashes  PHYSICAL EXAM: Filed Vitals:   04/10/15 0821  BP: 116/82  Pulse: 94  Resp: 16   General: No acute distress Head:  Normocephalic/atraumatic Neck: supple, no paraspinal tenderness, full range of motion Heart:  Regular rate and rhythm Lungs:  Clear to auscultation bilaterally Back: No paraspinal tenderness Skin/Extremities: No rash, no edema Neurological Exam: alert and oriented to person, place, and time. No aphasia or dysarthria. Fund of knowledge is appropriate.  Recent and remote memory are intact. 2/3 delayed recall. Attention and concentration are normal.  Able to name objects and repeat phrases. Cranial nerves: Pupils equal, round, reactive to light.  Fundoscopic exam unremarkable, no papilledema. Extraocular movements intact with no nystagmus. Visual fields full. Facial sensation intact. No facial asymmetry. Tongue, uvula, palate midline.  Motor: Bulk and tone normal, muscle strength 5/5 throughout with no pronator drift.  Sensation to light touch intact.  No extinction to double simultaneous stimulation.  Deep tendon reflexes 2+ throughout, toes downgoing.  Finger to nose testing intact.  Gait narrow-based and steady, able to tandem walk adequately.  Romberg negative.  IMPRESSION: This is a 44 yo RH man with worsening memory in the past year. MMSE in May 2016 was normal 29/30. MRI brain, TSH, B12 normal. We again discussed pseudodementia and effects of mood on memory. He continues to report anxiety and depression, and sees a psychiatrist. He will be referred for Neuropsychological evaluation to further delineate his symptoms. He is agreeable  to psychotherapy and will be referred to Behavioral health. He will follow-up in 4 months and knows to call our office for any changes.   Thank you for allowing me to participate in his care.  Please do not hesitate to call for any questions or concerns.  The duration of this appointment visit was 24 minutes of face-to-face time with the patient.  Greater than 50% of this time was spent in counseling, explanation of diagnosis, planning of further management, and coordination of care.   Ellouise Newer, M.D.   CC: Dr. Burnice Logan

## 2015-04-10 NOTE — Patient Instructions (Signed)
1. Refer for Neuropsychological evaluation at [place] [facility] 2. Refer to Behavioral Medicine for psychotherapy for depression and anxiety 3. Follow-up in 4 months, call for any problems

## 2015-04-12 ENCOUNTER — Other Ambulatory Visit: Payer: Self-pay | Admitting: Internal Medicine

## 2015-04-18 ENCOUNTER — Telehealth: Payer: Self-pay | Admitting: Gastroenterology

## 2015-04-18 ENCOUNTER — Encounter: Payer: Self-pay | Admitting: Gastroenterology

## 2015-04-18 NOTE — Telephone Encounter (Signed)
Spoke with pt and he is aware. 

## 2015-04-18 NOTE — Telephone Encounter (Signed)
Should try 2-3 OTC fleet enemas today.  Call back if no response.

## 2015-04-18 NOTE — Telephone Encounter (Signed)
Pt states he has been constipated for 4-5 days. Took linzess yesterday and a bottle of mag citrate and states he did not have any bowel movement. States he also took 2 extra strength laxative pills and had no movement. Pt states he feel stool in his rectum but it is not moving. Dr. Ardis Hughs please advise.

## 2015-05-20 ENCOUNTER — Other Ambulatory Visit: Payer: Self-pay | Admitting: Gastroenterology

## 2015-05-21 MED ORDER — LINACLOTIDE 145 MCG PO CAPS: 145.0000 ug | ORAL_CAPSULE | Freq: Every day | ORAL | Status: DC | PRN

## 2015-05-21 NOTE — Telephone Encounter (Signed)
Rx sent to Pharm for linzess 1 cap daily PRN. Per last OV note in June 2016 pt to take Linzess as needed. Rx sent for qty 30 with no refills at this time

## 2015-05-31 ENCOUNTER — Encounter: Payer: Self-pay | Admitting: Internal Medicine

## 2015-05-31 ENCOUNTER — Ambulatory Visit (INDEPENDENT_AMBULATORY_CARE_PROVIDER_SITE_OTHER): Payer: 59 | Admitting: Internal Medicine

## 2015-05-31 VITALS — BP 120/80 | HR 71 | Temp 98.4°F | Resp 20 | Ht 69.0 in | Wt 206.0 lb

## 2015-05-31 DIAGNOSIS — G4733 Obstructive sleep apnea (adult) (pediatric): Secondary | ICD-10-CM | POA: Diagnosis not present

## 2015-05-31 DIAGNOSIS — Z9989 Dependence on other enabling machines and devices: Secondary | ICD-10-CM

## 2015-05-31 DIAGNOSIS — B9789 Other viral agents as the cause of diseases classified elsewhere: Secondary | ICD-10-CM

## 2015-05-31 DIAGNOSIS — J309 Allergic rhinitis, unspecified: Secondary | ICD-10-CM

## 2015-05-31 DIAGNOSIS — J069 Acute upper respiratory infection, unspecified: Secondary | ICD-10-CM | POA: Diagnosis not present

## 2015-05-31 NOTE — Patient Instructions (Addendum)
Acute bronchitis symptoms for less than 10 days are generally not helped by antibiotics.  Take over-the-counter expectorants and cough medications such as  Mucinex DM.  Call if there is no improvement in 5 to 7 days or if  you develop worsening cough, fever, or new symptoms, such as shortness of breath or chest pain.  HOME CARE INSTRUCTIONS  Get plenty of rest.  Drink enough fluids to keep your urine clear or pale yellow (unless you have a medical condition that requires fluid restriction). Increasing fluids may help thin your respiratory secretions (sputum) and reduce chest congestion, and it will prevent dehydration.  Take medicines only as directed by your health care provider.   Avoid smoking and secondhand smoke. Exposure to cigarette smoke or irritating chemicals will make bronchitis worse. If you are a smoker, consider using nicotine gum or skin patches to help control withdrawal symptoms. Quitting smoking will help your lungs heal faster.  Reduce the chances of another bout of acute bronchitis by washing your hands frequently, avoiding people with cold symptoms, and trying not to touch your hands to your mouth, nose, or eyes.    Return for your annual exam in 6 months

## 2015-05-31 NOTE — Progress Notes (Signed)
Subjective:    Patient ID: Sean Wu, male    DOB: 1971/05/10, 44 y.o.   MRN: [id number]  HPI 45 year old patient who presents with a three-day history of sore throat, hoarseness, worsening headache and fatigue.  No fever.  He has had minimal productive cough.  He has decreased his smoking consumption to 4 cigarettes daily.  Still not using CPAP  Past Medical History  Diagnosis Date  . TESTICULAR HYPOFUNCTION 06/12/2009  . SMOKER 06/12/2009  . DEPRESSION 05/10/2009  . ALLERGIC RHINITIS 05/10/2009  . GERD 05/10/2009  . WEIGHT GAIN 05/10/2009  . LIBIDO, DECREASED 05/10/2009  . Osgood-Schlatter's disease   . Anxiety   . Chronic headaches   . Gastroparesis   . DDD (degenerative disc disease)   . SLEEP APNEA, OBSTRUCTIVE 06/12/2009    CPAP not utilized after 30 lb. weight loss  . Bipolar disorder     see medications patient to bring  . Neuromuscular disorder     Social History   Social History  . Marital Status: Married    Spouse Name: N/A  . Number of Children: 0  . Years of Education: College   Occupational History  . Unemployeed    Social History Main Topics  . Smoking status: Current Every Day Smoker -- 0.25 packs/day for 15 years    Types: Cigarettes  . Smokeless tobacco: Former Systems developer    Types: [place] date: 09/01/1998     Comment: tobacco info given 08/01/13  . Alcohol Use: 0.6 oz/week    0 Standard drinks or equivalent, 1 Cans of beer per week     Comment: social  . Drug Use: No  . Sexual Activity: Yes   Other Topics Concern  . Not on file   Social History Narrative    Past Surgical History  Procedure Laterality Date  . Lasik Bilateral   . Ankle surgery Left   . Colonoscopy  2014    normal  . Eye surgery      eye implant  . Fracture surgery      surgery on right ankle for ligament torn 2013  . Shoulder arthroscopy with distal clavicle resection Right 02/26/2015    Procedure: SHOULDER DIAGNOSTIC ARTHROSCOPY WITH OPEN DISTAL CLAVICLE EXCISION;   Surgeon: Tania Ade, MD;  Location: [place];  Service: Orthopedics;  Laterality: Right;  Right diagnostic arthroscopy, open distal clavical excision    Family History  Problem Relation Age of Onset  . Heart disease Maternal Grandfather   . Irritable bowel syndrome Father   . Alcoholism Father   . Heart attack Father   . Colon cancer Neg Hx   . Rectal cancer Neg Hx   . Stomach cancer Neg Hx     No Known Allergies  Current Outpatient Prescriptions on File Prior to Visit  Medication Sig Dispense Refill  . ALPRAZolam (XANAX) 1 MG tablet Take 1 tablet as needed    . Asenapine Maleate (SAPHRIS) 10 MG SUBL Place 20 mg under the tongue daily.    Hinda Kehr 30 MG/ACT SOLN APPLY 2 PUMPS EVERY MORNING 90 mL 2  . baclofen (LIORESAL) 10 MG tablet TAKE 1/2 TO 1 TAB UP TO TWICE A DAY AS NEEDED FOR HEADACHE *LIMIT TO 2 DAYS PER WEEK.AVOID DAILY USE  0  . diphenhydrAMINE (BENADRYL) 25 mg capsule Take 50 mg by mouth every 6 (six) hours as needed.    . lamoTRIgine (LAMICTAL) 150 MG tablet Take 300 mg by mouth daily.     Marland Kitchen  LINZESS 145 MCG CAPS capsule TAKE 1 CAPSULE BY MOUTH ONCE DAILY 30 capsule 0  . omeprazole (PRILOSEC) 20 MG capsule TAKE 1 CAPSULE (20 MG TOTAL) BY MOUTH DAILY. 90 capsule 2  . [DISCONTINUED] omeprazole (PRILOSEC OTC) 20 MG tablet 1 tablet twice daily. Do not eat for one hour after ingestion of the medication 60 tablet 11   No current facility-administered medications on file prior to visit.    BP 120/80 mmHg  Pulse 71  Temp(Src) 98.4 F (36.9 C) (Oral)  Resp 20  Ht 5\' 9"  (1.753 m)  Wt 206 lb (93.441 kg)  BMI 30.41 kg/m2  SpO2 98%      Review of Systems  Constitutional: Positive for activity change, appetite change and fatigue. Negative for fever and chills.  HENT: Positive for congestion, sinus pressure, sore throat and voice change. Negative for dental problem, ear pain, hearing loss, tinnitus and trouble swallowing.   Eyes: Negative for pain,  discharge and visual disturbance.  Respiratory: Positive for cough. Negative for chest tightness, wheezing and stridor.   Cardiovascular: Negative for chest pain, palpitations and leg swelling.  Gastrointestinal: Negative for nausea, vomiting, abdominal pain, diarrhea, constipation, blood in stool and abdominal distention.  Genitourinary: Negative for urgency, hematuria, flank pain, discharge, difficulty urinating and genital sores.  Musculoskeletal: Negative for myalgias, back pain, joint swelling, arthralgias, gait problem and neck stiffness.  Skin: Negative for rash.  Neurological: Negative for dizziness, syncope, speech difficulty, weakness, numbness and headaches.  Hematological: Negative for adenopathy. Does not bruise/bleed easily.  Psychiatric/Behavioral: Negative for behavioral problems and dysphoric mood. The patient is not nervous/anxious.        Objective:   Physical Exam  Constitutional: He is oriented to person, place, and time. He appears well-developed.  HENT:  Head: Normocephalic.  Right Ear: External ear normal.  Left Ear: External ear normal.  Erythema of the oropharynx  Eyes: Conjunctivae and EOM are normal.  Neck: Normal range of motion.  Cardiovascular: Normal rate and normal heart sounds.   Pulmonary/Chest: Breath sounds normal.  Abdominal: Bowel sounds are normal.  Musculoskeletal: Normal range of motion. He exhibits no edema or tenderness.  Neurological: He is alert and oriented to person, place, and time.  Psychiatric: He has a normal mood and affect. His behavior is normal.          Assessment & Plan:   Viral URI with cough.  Will treat symptomatically OSA.  CPAP strongly encouraged  Follow-up psychiatry CPX 6 months

## 2015-05-31 NOTE — Progress Notes (Signed)
Pre visit review using our clinic review tool, if applicable. No additional management support is needed unless otherwise documented below in the visit note. 

## 2015-06-01 ENCOUNTER — Telehealth (HOSPITAL_COMMUNITY): Payer: Self-pay

## 2015-06-13 ENCOUNTER — Other Ambulatory Visit: Payer: Self-pay | Admitting: Internal Medicine

## 2015-06-17 ENCOUNTER — Other Ambulatory Visit: Payer: Self-pay | Admitting: Gastroenterology

## 2015-06-25 ENCOUNTER — Other Ambulatory Visit: Payer: Self-pay | Admitting: Internal Medicine

## 2015-06-29 ENCOUNTER — Ambulatory Visit: Payer: 59 | Admitting: Internal Medicine

## 2015-07-11 ENCOUNTER — Telehealth: Payer: Self-pay | Admitting: Gastroenterology

## 2015-07-11 MED ORDER — LINACLOTIDE 290 MCG PO CAPS: 290.0000 ug | ORAL_CAPSULE | Freq: Every day | ORAL | Status: DC

## 2015-07-11 NOTE — Telephone Encounter (Signed)
Lets increase his linzess to 222mcg pill,  One pill once daily, disp 30 with 11 refills, have him call to report on his response in 3-4 weeks.

## 2015-07-11 NOTE — Telephone Encounter (Signed)
Patient notified and new rx sent to pharmacy. Patient aware to contact the office back in 3-4 weeks to notify us on his symptoms.

## 2015-07-11 NOTE — Telephone Encounter (Signed)
Spoke to patient and  he states that he is having constipation that started a few days ago. He has been on Linzess and tried Miralax and nothing is helping. He wants to see if there is anything else we can recommend. He denise abd pain, N/V and fever. Please advise

## 2015-07-31 ENCOUNTER — Ambulatory Visit: Payer: 59 | Admitting: Internal Medicine

## 2015-08-10 ENCOUNTER — Ambulatory Visit: Payer: 59 | Admitting: Neurology

## 2015-08-24 ENCOUNTER — Ambulatory Visit (INDEPENDENT_AMBULATORY_CARE_PROVIDER_SITE_OTHER): Payer: 59 | Admitting: Internal Medicine

## 2015-08-24 ENCOUNTER — Encounter: Payer: Self-pay | Admitting: Internal Medicine

## 2015-08-24 VITALS — BP 110/70 | HR 104 | Temp 98.5°F | Resp 20 | Ht 69.0 in | Wt 200.0 lb

## 2015-08-24 DIAGNOSIS — F3130 Bipolar disorder, current episode depressed, mild or moderate severity, unspecified: Secondary | ICD-10-CM | POA: Diagnosis not present

## 2015-08-24 DIAGNOSIS — G4733 Obstructive sleep apnea (adult) (pediatric): Secondary | ICD-10-CM | POA: Diagnosis not present

## 2015-08-24 DIAGNOSIS — Z9989 Dependence on other enabling machines and devices: Secondary | ICD-10-CM

## 2015-08-24 DIAGNOSIS — F319 Bipolar disorder, unspecified: Secondary | ICD-10-CM

## 2015-08-24 NOTE — Progress Notes (Signed)
Subjective:    Patient ID: Sean Wu, male    DOB: November 05, 1970, 44 y.o.   MRN: [id number]  HPI  44 year old patient who has a history of testosterone deficiency.  His chief complaint today is decreased libido but no ED he remains on testosterone supplementation.  This has been a chronic complaint.  He does have history bipolar disorder with depression and anxiety.  Past Medical History  Diagnosis Date  . TESTICULAR HYPOFUNCTION 06/12/2009  . SMOKER 06/12/2009  . DEPRESSION 05/10/2009  . ALLERGIC RHINITIS 05/10/2009  . GERD 05/10/2009  . WEIGHT GAIN 05/10/2009  . LIBIDO, DECREASED 05/10/2009  . Osgood-Schlatter's disease   . Anxiety   . Chronic headaches   . Gastroparesis   . DDD (degenerative disc disease)   . SLEEP APNEA, OBSTRUCTIVE 06/12/2009    CPAP not utilized after 30 lb. weight loss  . Bipolar disorder ([place])     see medications patient to bring  . Neuromuscular disorder [facility])     Social History   Social History  . Marital Status: Married    Spouse Name: N/A  . Number of Children: 0  . Years of Education: College   Occupational History  . Unemployeed    Social History Main Topics  . Smoking status: Current Every Day Smoker -- 0.25 packs/day for 15 years    Types: Cigarettes  . Smokeless tobacco: Former Systems developer    Types: [place] date: 09/01/1998     Comment: tobacco info given 08/01/13  . Alcohol Use: 0.6 oz/week    0 Standard drinks or equivalent, 1 Cans of beer per week     Comment: social  . Drug Use: No  . Sexual Activity: Yes   Other Topics Concern  . Not on file   Social History Narrative    Past Surgical History  Procedure Laterality Date  . Lasik Bilateral   . Ankle surgery Left   . Colonoscopy  2014    normal  . Eye surgery      eye implant  . Fracture surgery      surgery on right ankle for ligament torn 2013  . Shoulder arthroscopy with distal clavicle resection Right 02/26/2015    Procedure: SHOULDER DIAGNOSTIC ARTHROSCOPY WITH OPEN  DISTAL CLAVICLE EXCISION;  Surgeon: Tania Ade, MD;  Location: [facility];  Service: Orthopedics;  Laterality: Right;  Right diagnostic arthroscopy, open distal clavical excision    Family History  Problem Relation Age of Onset  . Heart disease Maternal Grandfather   . Irritable bowel syndrome Father   . Alcoholism Father   . Heart attack Father   . Colon cancer Neg Hx   . Rectal cancer Neg Hx   . Stomach cancer Neg Hx     No Known Allergies  Current Outpatient Prescriptions on File Prior to Visit  Medication Sig Dispense Refill  . ALPRAZolam (XANAX) 1 MG tablet Take 1 tablet as needed    . Asenapine Maleate (SAPHRIS) 10 MG SUBL Place 20 mg under the tongue daily.    Hinda Kehr 30 MG/ACT SOLN APPLY 2 PUMPS EVERY MORNING 90 mL 2  . baclofen (LIORESAL) 10 MG tablet TAKE 1/2 TO 1 TAB UP TO TWICE A DAY AS NEEDED FOR HEADACHE *LIMIT TO 2 DAYS PER WEEK.AVOID DAILY USE  0  . diphenhydrAMINE (BENADRYL) 25 mg capsule Take 50 mg by mouth every 6 (six) hours as needed.    . lamoTRIgine (LAMICTAL) 150 MG tablet Take 300 mg  by mouth daily.     . Linaclotide (LINZESS) 290 MCG CAPS capsule Take 1 capsule (290 mcg total) by mouth daily. 30 capsule 11  . omeprazole (PRILOSEC) 20 MG capsule TAKE 1 CAPSULE (20 MG TOTAL) BY MOUTH DAILY. 90 capsule 2  . prochlorperazine (COMPAZINE) 10 MG tablet TAKE 1 TABLET BY MOUTH EVERY 6 HOURS AS NEEDED 30 tablet 2  . traMADol (ULTRAM) 50 MG tablet TAKE 1 TABLET 3 TIMES A DAY AS NEEDED FOR PAIN 30 tablet 2  . [DISCONTINUED] omeprazole (PRILOSEC OTC) 20 MG tablet 1 tablet twice daily. Do not eat for one hour after ingestion of the medication 60 tablet 11   No current facility-administered medications on file prior to visit.    BP 110/70 mmHg  Pulse 104  Temp(Src) 98.5 F (36.9 C) (Oral)  Resp 20  Ht 5\' 9"  (1.753 m)  Wt 200 lb (90.719 kg)  BMI 29.52 kg/m2  SpO2 97%   \  Review of Systems  Constitutional: Negative for fever, chills,  appetite change and fatigue.  HENT: Negative for congestion, dental problem, ear pain, hearing loss, sore throat, tinnitus, trouble swallowing and voice change.   Eyes: Negative for pain, discharge and visual disturbance.  Respiratory: Negative for cough, chest tightness, wheezing and stridor.   Cardiovascular: Negative for chest pain, palpitations and leg swelling.  Gastrointestinal: Positive for constipation. Negative for nausea, vomiting, abdominal pain, diarrhea, blood in stool and abdominal distention.  Genitourinary: Negative for urgency, hematuria, flank pain, discharge, difficulty urinating and genital sores.  Musculoskeletal: Negative for myalgias, back pain, joint swelling, arthralgias, gait problem and neck stiffness.  Skin: Negative for rash.  Neurological: Negative for dizziness, syncope, speech difficulty, weakness, numbness and headaches.  Hematological: Negative for adenopathy. Does not bruise/bleed easily.  Psychiatric/Behavioral: Negative for behavioral problems and dysphoric mood. The patient is nervous/anxious.        Objective:   Physical Exam  Constitutional: He is oriented to person, place, and time. He appears well-developed.  HENT:  Head: Normocephalic.  Right Ear: External ear normal.  Left Ear: External ear normal.  Eyes: Conjunctivae and EOM are normal.  Neck: Normal range of motion.  Cardiovascular: Normal rate and normal heart sounds.   Pulmonary/Chest: Breath sounds normal.  Abdominal: Bowel sounds are normal.  Musculoskeletal: Normal range of motion. He exhibits no edema or tenderness.  Neurological: He is alert and oriented to person, place, and time.  Psychiatric: He has a normal mood and affect. His behavior is normal.          Assessment & Plan:   Testosterone deficiency Chronic constipation Bipolar disorder.  Follow-up psychiatry   No change in medical regimen CPX 6 months

## 2015-08-24 NOTE — Progress Notes (Signed)
Pre visit review using our clinic review tool, if applicable. No additional management support is needed unless otherwise documented below in the visit note. 

## 2015-08-24 NOTE — Patient Instructions (Signed)
It is important that you exercise regularly, at least 20 minutes 3 to 4 times per week.  If you develop chest pain or shortness of breath seek  medical attention.  Return in 6 months for follow-up  

## 2015-08-28 ENCOUNTER — Other Ambulatory Visit: Payer: Self-pay | Admitting: Internal Medicine

## 2015-08-29 ENCOUNTER — Telehealth: Payer: Self-pay | Admitting: Internal Medicine

## 2015-08-29 NOTE — Telephone Encounter (Signed)
[facility]  Patient Name: Sean Wu  DOB: December 10, 1970    Initial Comment [name] states he has been dizzy, light headed, when he stands he falls down. When he tried to concentrate on something it feels like he is going cross eyed.    Nurse Assessment  Nurse: Harlow Mares, RN, Suanne Marker Date/Time (Eastern Time): 08/29/2015 3:27:51 PM  Confirm and document reason for call. If symptomatic, describe symptoms. ---[name] states he has been dizzy, light headed, when he stands he falls down. When he tried to concentrate on something it feels like he is going cross eyed. Reports that he has fallen 3-4 times today. Reports he feels a spinning sensation. nausea, no vomiting.  Has the patient traveled out of the country within the last 30 days? ---No  Does the patient have any new or worsening symptoms? ---Yes  Will a triage be completed? ---Yes  Related visit to physician within the last 2 weeks? ---Yes  Does the PT have any chronic conditions? (i.e. diabetes, asthma, etc.) ---Yes  List chronic conditions. ---bipolar;  Is this a behavioral health or substance abuse call? ---No     Guidelines    Guideline Title Affirmed Question Affirmed Notes  Dizziness - Vertigo SEVERE dizziness (vertigo) (e.g., unable to walk without assistance)    Final Disposition User   Go to ED Now (or PCP triage) Harlow Mares, RN, Rhonda    Referrals  [facility] - ED   Disagree/Comply: Comply

## 2015-08-29 NOTE — Telephone Encounter (Signed)
Rx faxed to pharmacy  

## 2015-08-30 NOTE — Telephone Encounter (Signed)
Left a message for return call.  

## 2015-08-30 NOTE — Telephone Encounter (Signed)
Left a message for pt to return call 

## 2015-08-31 ENCOUNTER — Other Ambulatory Visit: Payer: Self-pay | Admitting: Internal Medicine

## 2015-08-31 NOTE — Telephone Encounter (Signed)
Final attempt to call patient; no answer  FYI; encounter will be closed.

## 2015-09-19 ENCOUNTER — Telehealth: Payer: Self-pay | Admitting: Internal Medicine

## 2015-09-19 NOTE — Telephone Encounter (Signed)
Dr. Glorious Peach called needing to talk with Dr. Burnice Logan about this patient. Dr. Glorious Peach asked for Dr. Burnice Logan to call him at [phone].

## 2015-09-20 ENCOUNTER — Other Ambulatory Visit: Payer: Self-pay | Admitting: Gastroenterology

## 2015-09-21 NOTE — Telephone Encounter (Signed)
Called patient and discussed dry mouth after talking with his oral surgeon.  Patient states his dry mouth is primarily in the morning when he awakes.  It was suggested that he try a humidifier throughout the night.  He will discuss with psychiatry medicines that might cause excessive dryness.  Benadryl OTC cough and cold medicines were discussed and will be avoided. Throughout the day.  The patient will consider sips of water or ice chips He will also consider sugar-free lozenges years ago more candy throughout the day  Close follow up with his general dentist and oral surgeon recommended

## 2015-10-09 ENCOUNTER — Other Ambulatory Visit: Payer: Self-pay | Admitting: Internal Medicine

## 2015-10-23 ENCOUNTER — Telehealth: Payer: Self-pay | Admitting: Gastroenterology

## 2015-10-24 NOTE — Telephone Encounter (Signed)
Pt states that the linzess 290 mcg is not working everyday,  He states he feels like he has emptied when he goes but does not go daily. Pt states he will continue the [place] if Dr Ardis Hughs recommends or will try something else if needed.  Please advise

## 2015-10-24 NOTE — Telephone Encounter (Signed)
Left message on machine to call back  

## 2015-10-25 NOTE — Telephone Encounter (Signed)
Pt has been notified and he will call back in 4-5 weeks to update

## 2015-10-25 NOTE — Telephone Encounter (Signed)
Have him add citrucel powder fiber supplement on a daily basis.  Continue the linzess daily.  He should call back in 4-5 weeks to report on his progress/

## 2015-11-13 ENCOUNTER — Other Ambulatory Visit: Payer: Self-pay | Admitting: Gastroenterology

## 2015-11-19 ENCOUNTER — Telehealth: Payer: Self-pay | Admitting: Internal Medicine

## 2015-11-19 NOTE — Telephone Encounter (Signed)
Viagra 100 mg  #6  one half to 1 tablet as needed.  Let patient know that we have discount coupons.

## 2015-11-19 NOTE — Telephone Encounter (Signed)
Pt contacted the pharmacy for a refill on VIAGRA and was told they dont have that rx on file for him .  Pt call to ask for a rx on VIAGRA

## 2015-11-19 NOTE — Telephone Encounter (Signed)
Pt requesting Viagra. Please advise dosage?

## 2015-11-20 MED ORDER — SILDENAFIL CITRATE 100 MG PO TABS: 100.0000 mg | ORAL_TABLET | Freq: Every day | ORAL | Status: DC | PRN

## 2015-11-20 NOTE — Telephone Encounter (Signed)
Left message on voicemail Rx sent to pharmacy as requested. 

## 2015-12-02 ENCOUNTER — Other Ambulatory Visit: Payer: Self-pay | Admitting: Internal Medicine

## 2015-12-10 ENCOUNTER — Telehealth: Payer: Self-pay | Admitting: General Practice

## 2015-12-10 NOTE — Telephone Encounter (Signed)
PA began with [facility]

## 2015-12-11 ENCOUNTER — Encounter: Payer: Self-pay | Admitting: Internal Medicine

## 2015-12-11 ENCOUNTER — Ambulatory Visit (INDEPENDENT_AMBULATORY_CARE_PROVIDER_SITE_OTHER): Payer: 59 | Admitting: Internal Medicine

## 2015-12-11 VITALS — BP 126/90 | HR 100 | Temp 98.5°F | Resp 20 | Ht 69.0 in | Wt 198.0 lb

## 2015-12-11 DIAGNOSIS — R1010 Upper abdominal pain, unspecified: Secondary | ICD-10-CM

## 2015-12-11 DIAGNOSIS — F17200 Nicotine dependence, unspecified, uncomplicated: Secondary | ICD-10-CM | POA: Diagnosis not present

## 2015-12-11 DIAGNOSIS — K5900 Constipation, unspecified: Secondary | ICD-10-CM

## 2015-12-11 DIAGNOSIS — K5909 Other constipation: Secondary | ICD-10-CM

## 2015-12-11 DIAGNOSIS — K3184 Gastroparesis: Secondary | ICD-10-CM

## 2015-12-11 MED ORDER — METOCLOPRAMIDE HCL 10 MG PO TABS: 10.0000 mg | ORAL_TABLET | Freq: Three times a day (TID) | ORAL | Status: DC

## 2015-12-11 NOTE — Patient Instructions (Signed)
Metoclopramide 10 mg 3 times daily before meals and at bedtime  Continue Prilosec twice daily  Call or return to clinic prn if these symptoms worsen or fail to improve as anticipated.

## 2015-12-11 NOTE — Progress Notes (Signed)
Subjective:    Patient ID: Sean Wu, male    DOB: August 06, 1971, 45 y.o.   MRN: [id number]  HPI  45 year old patient who is seen for evaluation of abdominal pain.  This awoken yesterday morning and is described as a very sharp, fleeting epigastric discomfort.  Pain lasts 5-10 seconds.  There is associate with some nausea that occurs with a frequency every 30-45 minutes, but occurred rarely through the night last night.  No vomiting.  He has chronic constipation which has been well-controlled with medications.  He does have a history of H. pylori negative gastritis as well as gastroparesis. He is on chronic PPI therapy.  A CT abdominal scan was performed in July 2014 due to right lower quadrant pain.  He had a follow-up CT abdominal scan in October 2014 due to left flank pain.  Past Medical History  Diagnosis Date  . TESTICULAR HYPOFUNCTION 06/12/2009  . SMOKER 06/12/2009  . DEPRESSION 05/10/2009  . ALLERGIC RHINITIS 05/10/2009  . GERD 05/10/2009  . WEIGHT GAIN 05/10/2009  . LIBIDO, DECREASED 05/10/2009  . Osgood-Schlatter's disease   . Anxiety   . Chronic headaches   . Gastroparesis   . DDD (degenerative disc disease)   . SLEEP APNEA, OBSTRUCTIVE 06/12/2009    CPAP not utilized after 30 lb. weight loss  . Bipolar disorder ([place])     see medications patient to bring  . Neuromuscular disorder [facility])     Social History   Social History  . Marital Status: Married    Spouse Name: N/A  . Number of Children: 0  . Years of Education: College   Occupational History  . Unemployeed    Social History Main Topics  . Smoking status: Current Every Day Smoker -- 0.25 packs/day for 15 years    Types: Cigarettes  . Smokeless tobacco: Former Systems developer    Types: [place] date: 09/01/1998     Comment: tobacco info given 08/01/13  . Alcohol Use: 0.6 oz/week    0 Standard drinks or equivalent, 1 Cans of beer per week     Comment: social  . Drug Use: No  . Sexual Activity: Yes   Other Topics  Concern  . Not on file   Social History Narrative    Past Surgical History  Procedure Laterality Date  . Lasik Bilateral   . Ankle surgery Left   . Colonoscopy  2014    normal  . Eye surgery      eye implant  . Fracture surgery      surgery on right ankle for ligament torn 2013  . Shoulder arthroscopy with distal clavicle resection Right 02/26/2015    Procedure: SHOULDER DIAGNOSTIC ARTHROSCOPY WITH OPEN DISTAL CLAVICLE EXCISION;  Surgeon: Tania Ade, MD;  Location: [place];  Service: Orthopedics;  Laterality: Right;  Right diagnostic arthroscopy, open distal clavical excision    Family History  Problem Relation Age of Onset  . Heart disease Maternal Grandfather   . Irritable bowel syndrome Father   . Alcoholism Father   . Heart attack Father   . Colon cancer Neg Hx   . Rectal cancer Neg Hx   . Stomach cancer Neg Hx     No Known Allergies  Current Outpatient Prescriptions on File Prior to Visit  Medication Sig Dispense Refill  . ALPRAZolam (XANAX) 1 MG tablet Take 1 tablet as needed    . Asenapine Maleate (SAPHRIS) 10 MG SUBL Place 20 mg under the tongue daily.    Marland Kitchen  AXIRON 30 MG/ACT SOLN APPLY 2 PUMPS TO AFFECTED AREA ONCE EVERY MORNING 90 mL 2  . baclofen (LIORESAL) 10 MG tablet TAKE 1/2 TO 1 TAB UP TO TWICE A DAY AS NEEDED FOR HEADACHE *LIMIT TO 2 DAYS PER WEEK.AVOID DAILY USE  0  . diphenhydrAMINE (BENADRYL) 25 mg capsule Take 50 mg by mouth every 6 (six) hours as needed.    . DULoxetine (CYMBALTA) 30 MG capsule Take 30 mg by mouth every morning.  0  . lamoTRIgine (LAMICTAL) 150 MG tablet Take 300 mg by mouth daily.     . Linaclotide (LINZESS) 290 MCG CAPS capsule Take 1 capsule (290 mcg total) by mouth daily. 30 capsule 11  . LINZESS 145 MCG CAPS capsule TAKE 1 CAPSULE BY MOUTH ONCE DAILY 30 capsule 1  . omeprazole (PRILOSEC) 20 MG capsule TAKE 1 CAPSULE (20 MG TOTAL) BY MOUTH DAILY. 90 capsule 2  . prochlorperazine (COMPAZINE) 10 MG tablet TAKE 1  TABLET BY MOUTH EVERY 6 HOURS AS NEEDED 30 tablet 2  . sildenafil (VIAGRA) 100 MG tablet Take 1 tablet (100 mg total) by mouth daily as needed for erectile dysfunction. 6 tablet 2  . traMADol (ULTRAM) 50 MG tablet TAKE 1 TABLET BY MOUTH 3 TIMES A DAY AS NEEDED FOR PAIN 30 tablet 2  . zolpidem (AMBIEN) 10 MG tablet     . zonisamide (ZONEGRAN) 25 MG capsule TAKE 4 CAPSULES BY MOUTH ONCE DAILY AT BEDTIME  1  . zonisamide (ZONEGRAN) 50 MG capsule TAKE 3 CAPSULES EVERY DAY  1  . [DISCONTINUED] omeprazole (PRILOSEC OTC) 20 MG tablet 1 tablet twice daily. Do not eat for one hour after ingestion of the medication 60 tablet 11   No current facility-administered medications on file prior to visit.    BP 126/90 mmHg  Pulse 100  Temp(Src) 98.5 F (36.9 C) (Oral)  Resp 20  Ht 5\' 9"  (1.753 m)  Wt 198 lb (89.812 kg)  BMI 29.23 kg/m2  SpO2 98%    Review of Systems  Constitutional: Negative for fever, chills, appetite change and fatigue.  HENT: Negative for congestion, dental problem, ear pain, hearing loss, sore throat, tinnitus, trouble swallowing and voice change.   Eyes: Negative for pain, discharge and visual disturbance.  Respiratory: Negative for cough, chest tightness, wheezing and stridor.   Cardiovascular: Negative for chest pain, palpitations and leg swelling.  Gastrointestinal: Positive for nausea, abdominal pain and constipation. Negative for vomiting, diarrhea, blood in stool and abdominal distention.  Genitourinary: Negative for urgency, hematuria, flank pain, discharge, difficulty urinating and genital sores.  Musculoskeletal: Negative for myalgias, back pain, joint swelling, arthralgias, gait problem and neck stiffness.  Skin: Negative for rash.  Neurological: Negative for dizziness, syncope, speech difficulty, weakness, numbness and headaches.  Hematological: Negative for adenopathy. Does not bruise/bleed easily.  Psychiatric/Behavioral: Negative for behavioral problems and  dysphoric mood. The patient is not nervous/anxious.        Objective:   Physical Exam  Constitutional: He is oriented to person, place, and time. He appears well-developed. No distress.  HENT:  Head: Normocephalic.  Right Ear: External ear normal.  Left Ear: External ear normal.  Eyes: Conjunctivae and EOM are normal.  Neck: Normal range of motion.  Cardiovascular: Normal rate and normal heart sounds.   Pulmonary/Chest: Breath sounds normal.  Abdominal: Soft. Bowel sounds are normal. There is tenderness.  Very mild epigastric tenderness Seems more tender in the left lower quadrant and suprapubic area  Musculoskeletal: Normal range of motion. He  exhibits no edema or tenderness.  Neurological: He is alert and oriented to person, place, and time.  Psychiatric: He has a normal mood and affect. His behavior is normal.          Assessment & Plan:   Epigastric pain.  History of gastritis and gastroparesis.  We'll continue twice a day PPI therapy.  Will add metoclopramide short-term over the next 10 days and observe Chronic constipation  GI follow-up if unimproved

## 2015-12-11 NOTE — Progress Notes (Signed)
Pre visit review using our clinic review tool, if applicable. No additional management support is needed unless otherwise documented below in the visit note. 

## 2015-12-21 ENCOUNTER — Telehealth: Payer: Self-pay | Admitting: Internal Medicine

## 2015-12-21 NOTE — Telephone Encounter (Signed)
Pt states he received a denial letter for his sildenafil (VIAGRA) 100 MG tablet Pt states he has been on this for years, dx ED.  His denial letter states it was denied because he does not have HAPE ( [place] [facility] [facility].) Pt thinks this may have been submitted incorrectly and would like someone to look in to this. thanks

## 2015-12-25 ENCOUNTER — Encounter: Payer: Self-pay | Admitting: Internal Medicine

## 2015-12-25 NOTE — Telephone Encounter (Signed)
Letter of appeal submitted to [employer], will await response.

## 2016-01-01 ENCOUNTER — Other Ambulatory Visit: Payer: Self-pay | Admitting: Internal Medicine

## 2016-01-03 ENCOUNTER — Telehealth: Payer: Self-pay | Admitting: Family Medicine

## 2016-01-03 NOTE — Telephone Encounter (Signed)
Dr.K, Prior authorization for Viagra was denied.

## 2016-01-03 NOTE — Telephone Encounter (Signed)
A Pa was done in Cover My Meds for Viagra for patient. The medication was denied.   PA was already submitted for this patient and drug which was denied.;[id number];Appeal Information: Attention:ATTN: [place] [id number]. [id number];

## 2016-01-04 ENCOUNTER — Telehealth: Payer: Self-pay | Admitting: Gastroenterology

## 2016-01-04 NOTE — Telephone Encounter (Signed)
Patient reports that in Feb he was changed to [place] 290 and had Citrucel powder added to his regimen.  He reports that about 2 weeks ago the linzess and Citrucel have stopped working.  He is going 5-6 days between BM.  Prior to this point the Citrucel and [place] were working great.  Dr. Ardis Hughs please advise next step

## 2016-01-04 NOTE — Telephone Encounter (Signed)
Should take 6-8 doses of miralax today to purge, then restart the same regimen (linzess, citrucel) plus one daily dose of miralax.  He should call back in 3-4 weeks to report on his progress

## 2016-01-04 NOTE — Telephone Encounter (Signed)
Patient notified He will call back in a few weeks

## 2016-01-10 ENCOUNTER — Telehealth: Payer: Self-pay | Admitting: Internal Medicine

## 2016-01-10 DIAGNOSIS — G43809 Other migraine, not intractable, without status migrainosus: Secondary | ICD-10-CM

## 2016-01-10 NOTE — Telephone Encounter (Signed)
Pt would like to be referred to an Neurologist for his chronic migraines.

## 2016-01-10 NOTE — Telephone Encounter (Signed)
Please see message and advise 

## 2016-01-11 NOTE — Telephone Encounter (Signed)
Spoke to pt, told him order for referral to Neurology was done and someone will be contacting you to schedule an appt. Pt verbalized understanding.

## 2016-01-11 NOTE — Telephone Encounter (Signed)
Okay for referral?

## 2016-01-17 ENCOUNTER — Telehealth: Payer: Self-pay | Admitting: Internal Medicine

## 2016-01-17 NOTE — Telephone Encounter (Signed)
Viagra was approved through 01/08/2017

## 2016-01-30 ENCOUNTER — Telehealth: Payer: Self-pay | Admitting: Family Medicine

## 2016-01-30 NOTE — Telephone Encounter (Signed)
Received a fax fro [facility] for pa on Axiron 30mg /Actuation Soln.  Submitted online to CoverMyMeds.  Received approval.  Approval dates are 01/30/16 - 01/29/17.  Left a message on voicemail informing the pt that his medication was approved.  Called the pharmacy and informed them as well.

## 2016-02-04 ENCOUNTER — Telehealth: Payer: Self-pay | Admitting: Gastroenterology

## 2016-02-04 NOTE — Telephone Encounter (Signed)
States no liquid or formed stool is passing. It has been about 5 days now. He feels bloated and slightly nauseous. He does pass some gas. Presently he takes Miralax 3 times each day and Linzess 290 mcg daily.

## 2016-02-05 ENCOUNTER — Other Ambulatory Visit: Payer: Self-pay | Admitting: Internal Medicine

## 2016-02-15 ENCOUNTER — Other Ambulatory Visit: Payer: 59

## 2016-02-19 ENCOUNTER — Ambulatory Visit: Payer: 59 | Admitting: Diagnostic Neuroimaging

## 2016-02-22 ENCOUNTER — Ambulatory Visit (INDEPENDENT_AMBULATORY_CARE_PROVIDER_SITE_OTHER): Payer: 59 | Admitting: Internal Medicine

## 2016-02-22 ENCOUNTER — Encounter: Payer: Self-pay | Admitting: Internal Medicine

## 2016-02-22 VITALS — BP 130/90 | HR 81 | Temp 98.3°F | Resp 20 | Ht 68.0 in | Wt 206.0 lb

## 2016-02-22 DIAGNOSIS — G4733 Obstructive sleep apnea (adult) (pediatric): Secondary | ICD-10-CM

## 2016-02-22 DIAGNOSIS — F17200 Nicotine dependence, unspecified, uncomplicated: Secondary | ICD-10-CM | POA: Diagnosis not present

## 2016-02-22 DIAGNOSIS — J309 Allergic rhinitis, unspecified: Secondary | ICD-10-CM

## 2016-02-22 DIAGNOSIS — R6882 Decreased libido: Secondary | ICD-10-CM | POA: Diagnosis not present

## 2016-02-22 DIAGNOSIS — Z9989 Dependence on other enabling machines and devices: Secondary | ICD-10-CM

## 2016-02-22 DIAGNOSIS — K5900 Constipation, unspecified: Secondary | ICD-10-CM

## 2016-02-22 DIAGNOSIS — Z0000 Encounter for general adult medical examination without abnormal findings: Secondary | ICD-10-CM | POA: Diagnosis not present

## 2016-02-22 DIAGNOSIS — K5909 Other constipation: Secondary | ICD-10-CM

## 2016-02-22 LAB — CBC WITH DIFFERENTIAL/PLATELET
BASOS PCT: 0.7 % (ref 0.0–3.0)
Basophils Absolute: 0 10*3/uL (ref 0.0–0.1)
EOS PCT: 2.5 % (ref 0.0–5.0)
Eosinophils Absolute: 0.2 10*3/uL (ref 0.0–0.7)
HCT: 44.5 % (ref 39.0–52.0)
HEMOGLOBIN: 15 g/dL (ref 13.0–17.0)
LYMPHS ABS: 1.7 10*3/uL (ref 0.7–4.0)
Lymphocytes Relative: 27.6 % (ref 12.0–46.0)
MCHC: 33.7 g/dL (ref 30.0–36.0)
MCV: 90.7 fl (ref 78.0–100.0)
MONOS PCT: 6.1 % (ref 3.0–12.0)
Monocytes Absolute: 0.4 10*3/uL (ref 0.1–1.0)
Neutro Abs: 3.9 10*3/uL (ref 1.4–7.7)
Neutrophils Relative %: 63.1 % (ref 43.0–77.0)
Platelets: 283 10*3/uL (ref 150.0–400.0)
RBC: 4.9 Mil/uL (ref 4.22–5.81)
RDW: 14.5 % (ref 11.5–15.5)
WBC: 6.2 10*3/uL (ref 4.0–10.5)

## 2016-02-22 LAB — BASIC METABOLIC PANEL
BUN: 10 mg/dL (ref 6–23)
CHLORIDE: 109 meq/L (ref 96–112)
CO2: 23 mEq/L (ref 19–32)
Calcium: 9.1 mg/dL (ref 8.4–10.5)
Creatinine, Ser: 1.01 mg/dL (ref 0.40–1.50)
GFR: 84.08 mL/min (ref >60.00)
GLUCOSE: 92 mg/dL (ref 70–99)
POTASSIUM: 3.7 meq/L (ref 3.5–5.1)
SODIUM: 141 meq/L (ref 135–145)

## 2016-02-22 LAB — HEPATIC FUNCTION PANEL
ALBUMIN: 3.9 g/dL (ref 3.5–5.2)
ALT: 15 U/L (ref 0–53)
AST: 16 U/L (ref 0–37)
Alkaline Phosphatase: 56 U/L (ref 39–117)
Bilirubin, Direct: 0.1 mg/dL (ref 0.0–0.3)
Total Bilirubin: 0.4 mg/dL (ref 0.2–1.2)
Total Protein: 6.3 g/dL (ref 6.0–8.3)

## 2016-02-22 LAB — POC URINALSYSI DIPSTICK (AUTOMATED)
Bilirubin, UA: NEGATIVE
Blood, UA: NEGATIVE
Glucose, UA: NEGATIVE
Ketones, UA: NEGATIVE
LEUKOCYTES UA: NEGATIVE
NITRITE UA: NEGATIVE
Protein, UA: NEGATIVE
Spec Grav, UA: 1.025
UROBILINOGEN UA: 0.2
pH, UA: 5.5

## 2016-02-22 LAB — LIPID PANEL
CHOLESTEROL: 240 mg/dL — AB (ref 0–200)
HDL: 40.3 mg/dL (ref >39.00)
NonHDL: 199.56
TRIGLYCERIDES: 226 mg/dL — AB (ref 0.0–149.0)
Total CHOL/HDL Ratio: 6
VLDL: 45.2 mg/dL — ABNORMAL HIGH (ref 0.0–40.0)

## 2016-02-22 LAB — LDL CHOLESTEROL, DIRECT: LDL DIRECT: 141 mg/dL

## 2016-02-22 LAB — TSH: TSH: 0.66 u[IU]/mL (ref 0.35–4.50)

## 2016-02-22 NOTE — Progress Notes (Signed)
Subjective:    Patient ID: Sean Wu, male    DOB: 1971/03/09, 45 y.o.   MRN: [id number]  HPI  45 year old patient who is seen today for a preventive health examination Problems include chronic constipation.  He has a history of anxiety, depression.  There have been some situational stressors He did obtain earlier in the week.  A fasting testosterone level.  He does have a history of testosterone deficiency. He has chronic constipation which is unchanged  No new concerns or complaints. Remains on number of psychoactive medications.  Past Medical History  Diagnosis Date  . TESTICULAR HYPOFUNCTION 06/12/2009  . SMOKER 06/12/2009  . DEPRESSION 05/10/2009  . ALLERGIC RHINITIS 05/10/2009  . GERD 05/10/2009  . WEIGHT GAIN 05/10/2009  . LIBIDO, DECREASED 05/10/2009  . Osgood-Schlatter's disease   . Anxiety   . Chronic headaches   . Gastroparesis   . DDD (degenerative disc disease)   . SLEEP APNEA, OBSTRUCTIVE 06/12/2009    CPAP not utilized after 30 lb. weight loss  . Bipolar disorder ([place])     see medications patient to bring  . Neuromuscular disorder [facility])      Social History   Social History  . Marital Status: Married    Spouse Name: N/A  . Number of Children: 0  . Years of Education: College   Occupational History  . Unemployeed    Social History Main Topics  . Smoking status: Current Every Day Smoker -- 0.25 packs/day for 15 years    Types: Cigarettes  . Smokeless tobacco: Former Systems developer    Types: [place] date: 09/01/1998     Comment: tobacco info given 08/01/13  . Alcohol Use: 0.6 oz/week    0 Standard drinks or equivalent, 1 Cans of beer per week     Comment: social  . Drug Use: No  . Sexual Activity: Yes   Other Topics Concern  . Not on file   Social History Narrative    Past Surgical History  Procedure Laterality Date  . Lasik Bilateral   . Ankle surgery Left   . Colonoscopy  2014    normal  . Eye surgery      eye implant  . Fracture surgery        surgery on right ankle for ligament torn 2013  . Shoulder arthroscopy with distal clavicle resection Right 02/26/2015    Procedure: SHOULDER DIAGNOSTIC ARTHROSCOPY WITH OPEN DISTAL CLAVICLE EXCISION;  Surgeon: Tania Ade, MD;  Location: [facility];  Service: Orthopedics;  Laterality: Right;  Right diagnostic arthroscopy, open distal clavical excision    Family History  Problem Relation Age of Onset  . Heart disease Maternal Grandfather   . Irritable bowel syndrome Father   . Alcoholism Father   . Heart attack Father   . Colon cancer Neg Hx   . Rectal cancer Neg Hx   . Stomach cancer Neg Hx     No Known Allergies  Current Outpatient Prescriptions on File Prior to Visit  Medication Sig Dispense Refill  . ALPRAZolam (XANAX) 1 MG tablet Take 1 tablet as needed    . AMITIZA 8 MCG capsule TAKE 1 CAPSULE BY MOUTH TWICE DAILY WITH A MEAL 60 capsule 2  . Asenapine Maleate (SAPHRIS) 10 MG SUBL Place 20 mg under the tongue daily.    Hinda Kehr 30 MG/ACT SOLN APPLY 2 PUMPS TO AFFECTED AREA ONCE EVERY MORNING 90 mL 2  . baclofen (LIORESAL) 10 MG tablet TAKE 1/2  TO 1 TAB UP TO TWICE A DAY AS NEEDED FOR HEADACHE *LIMIT TO 2 DAYS PER WEEK.AVOID DAILY USE  0  . diphenhydrAMINE (BENADRYL) 25 mg capsule Take 50 mg by mouth every 6 (six) hours as needed.    . lamoTRIgine (LAMICTAL) 150 MG tablet Take 300 mg by mouth daily.     . Linaclotide (LINZESS) 290 MCG CAPS capsule Take 1 capsule (290 mcg total) by mouth daily. 30 capsule 11  . metoCLOPramide (REGLAN) 10 MG tablet Take 1 tablet (10 mg total) by mouth 4 (four) times daily -  before meals and at bedtime. 40 tablet 0  . omeprazole (PRILOSEC) 20 MG capsule TAKE 1 CAPSULE (20 MG TOTAL) BY MOUTH DAILY. 90 capsule 2  . prochlorperazine (COMPAZINE) 10 MG tablet TAKE 1 TABLET BY MOUTH EVERY 6 HOURS AS NEEDED 30 tablet 1  . sildenafil (VIAGRA) 100 MG tablet Take 1 tablet (100 mg total) by mouth daily as needed for erectile dysfunction. 6  tablet 2  . traMADol (ULTRAM) 50 MG tablet TAKE 1 TABLET BY MOUTH 3 TIMES A DAY AS NEEDED FOR PAIN 30 tablet 2  . zolpidem (AMBIEN) 10 MG tablet     . zonisamide (ZONEGRAN) 50 MG capsule TAKE 3 CAPSULES EVERY DAY  1  . [DISCONTINUED] omeprazole (PRILOSEC OTC) 20 MG tablet 1 tablet twice daily. Do not eat for one hour after ingestion of the medication 60 tablet 11   No current facility-administered medications on file prior to visit.    BP 130/90 mmHg  Pulse 81  Temp(Src) 98.3 F (36.8 C) (Oral)  Resp 20  Ht 5\' 8"  (1.727 m)  Wt 206 lb (93.441 kg)  BMI 31.33 kg/m2  SpO2 97%     Review of Systems  Constitutional: Negative for fever, chills, activity change, appetite change and fatigue.  HENT: Negative for congestion, dental problem, ear pain, hearing loss, mouth sores, rhinorrhea, sinus pressure, sneezing, tinnitus, trouble swallowing and voice change.   Eyes: Negative for photophobia, pain, redness and visual disturbance.  Respiratory: Negative for apnea, cough, choking, chest tightness, shortness of breath and wheezing.   Cardiovascular: Negative for chest pain, palpitations and leg swelling.  Gastrointestinal: Negative for nausea, vomiting, abdominal pain, diarrhea, constipation, blood in stool, abdominal distention, anal bleeding and rectal pain.  Genitourinary: Negative for dysuria, urgency, frequency, hematuria, flank pain, decreased urine volume, discharge, penile swelling, scrotal swelling, difficulty urinating, genital sores and testicular pain.  Musculoskeletal: Negative for myalgias, back pain, joint swelling, arthralgias, gait problem, neck pain and neck stiffness.  Skin: Negative for color change, rash and wound.  Neurological: Negative for dizziness, tremors, seizures, syncope, facial asymmetry, speech difficulty, weakness, light-headedness, numbness and headaches.  Hematological: Negative for adenopathy. Does not bruise/bleed easily.  Psychiatric/Behavioral: Negative for  suicidal ideas, hallucinations, behavioral problems, confusion, sleep disturbance, self-injury, dysphoric mood, decreased concentration and agitation. The patient is not nervous/anxious.        Objective:   Physical Exam  Constitutional: He appears well-developed and well-nourished.  HENT:  Head: Normocephalic and atraumatic.  Right Ear: External ear normal.  Left Ear: External ear normal.  Nose: Nose normal.  Mouth/Throat: Oropharynx is clear and moist.  Eyes: Conjunctivae and EOM are normal. Pupils are equal, round, and reactive to light. No scleral icterus.  Neck: Normal range of motion. Neck supple. No JVD present. No thyromegaly present.  Cardiovascular: Regular rhythm, normal heart sounds and intact distal pulses.  Exam reveals no gallop and no friction rub.   No murmur heard.  Pulmonary/Chest: Effort normal and breath sounds normal. He exhibits no tenderness.  Abdominal: Soft. Bowel sounds are normal. He exhibits no distension and no mass. There is no tenderness.  Genitourinary: Penis normal.  Musculoskeletal: Normal range of motion. He exhibits no edema or tenderness.  Lymphadenopathy:    He has no cervical adenopathy.  Neurological: He is alert. He has normal reflexes. No cranial nerve deficit. Coordination normal.  Skin: Skin is warm and dry. No rash noted.  Psychiatric: He has a normal mood and affect. His behavior is normal.          Assessment & Plan:   Preventive health examination Ongoing tobacco abuse.  Total smoking cessation encouraged History of anxiety, depression Testosterone insufficiency.  Continue supplementation Chronic constipation.  No change in therapy Allergic rhinitis Chronic lumbar radiculopathy  Return in 6-12 months Check laboratory update  Nyoka Cowden, MD

## 2016-02-22 NOTE — Patient Instructions (Addendum)
It is important that you exercise regularly, at least 20 minutes 3 to 4 times per week.  If you develop chest pain or shortness of breath seek  medical attention.  You need to lose weight.  Consider a lower calorie diet and regular exercise.  Smoking tobacco is very bad for your health. You should stop smoking immediately.    Health Maintenance, Male A healthy lifestyle and preventative care can promote health and wellness.  Maintain regular health, dental, and eye exams.  Eat a healthy diet. Foods like vegetables, fruits, whole grains, low-fat dairy products, and lean protein foods contain the nutrients you need and are low in calories. Decrease your intake of foods high in solid fats, added sugars, and salt. Get information about a proper diet from your health care provider, if necessary.  Regular physical exercise is one of the most important things you can do for your health. Most adults should get at least 150 minutes of moderate-intensity exercise (any activity that increases your heart rate and causes you to sweat) each week. In addition, most adults need muscle-strengthening exercises on 2 or more days a week.   Maintain a healthy weight. The body mass index (BMI) is a screening tool to identify possible weight problems. It provides an estimate of body fat based on height and weight. Your health care provider can find your BMI and can help you achieve or maintain a healthy weight. For males 20 years and older:  A BMI below 18.5 is considered underweight.  A BMI of 18.5 to 24.9 is normal.  A BMI of 25 to 29.9 is considered overweight.  A BMI of 30 and above is considered obese.  Maintain normal blood lipids and cholesterol by exercising and minimizing your intake of saturated fat. Eat a balanced diet with plenty of fruits and vegetables. Blood tests for lipids and cholesterol should begin at age 36 and be repeated every 5 years. If your lipid or cholesterol levels are high, you  are over age 85, or you are at high risk for heart disease, you may need your cholesterol levels checked more frequently.Ongoing high lipid and cholesterol levels should be treated with medicines if diet and exercise are not working.  If you smoke, find out from your health care provider how to quit. If you do not use tobacco, do not start.  Lung cancer screening is recommended for adults aged 32-80 years who are at high risk for developing lung cancer because of a history of smoking. A yearly low-dose CT scan of the lungs is recommended for people who have at least a 30-pack-year history of smoking and are current smokers or have quit within the past 15 years. A pack year of smoking is smoking an average of 1 pack of cigarettes a day for 1 year (for example, a 30-pack-year history of smoking could mean smoking 1 pack a day for 30 years or 2 packs a day for 15 years). Yearly screening should continue until the smoker has stopped smoking for at least 15 years. Yearly screening should be stopped for people who develop a health problem that would prevent them from having lung cancer treatment.  If you choose to drink alcohol, do not have more than 2 drinks per day. One drink is considered to be 12 oz (360 mL) of beer, 5 oz (150 mL) of wine, or 1.5 oz (45 mL) of liquor.  Avoid the use of street drugs. Do not share needles with anyone. Ask for help if  you need support or instructions about stopping the use of drugs.  High blood pressure causes heart disease and increases the risk of stroke. High blood pressure is more likely to develop in:  People who have blood pressure in the end of the normal range (100-139/85-89 mm Hg).  People who are overweight or obese.  People who are African American.  If you are 105-99 years of age, have your blood pressure checked every 3-5 years. If you are 83 years of age or older, have your blood pressure checked every year. You should have your blood pressure measured  twice--once when you are at a hospital or clinic, and once when you are not at a hospital or clinic. Record the average of the two measurements. To check your blood pressure when you are not at a hospital or clinic, you can use:  An automated blood pressure machine at a pharmacy.  A home blood pressure monitor.  If you are 4-63 years old, ask your health care provider if you should take aspirin to prevent heart disease.  Diabetes screening involves taking a blood sample to check your fasting blood sugar level. This should be done once every 3 years after age 53 if you are at a normal weight and without risk factors for diabetes. Testing should be considered at a younger age or be carried out more frequently if you are overweight and have at least 1 risk factor for diabetes.  Colorectal cancer can be detected and often prevented. Most routine colorectal cancer screening begins at the age of 21 and continues through age 57. However, your health care provider may recommend screening at an earlier age if you have risk factors for colon cancer. On a yearly basis, your health care provider may provide home test kits to check for hidden blood in the stool. A small camera at the end of a tube may be used to directly examine the colon (sigmoidoscopy or colonoscopy) to detect the earliest forms of colorectal cancer. Talk to your health care provider about this at age 54 when routine screening begins. A direct exam of the colon should be repeated every 5-10 years through age 30, unless early forms of precancerous polyps or small growths are found.  People who are at an increased risk for hepatitis B should be screened for this virus. You are considered at high risk for hepatitis B if:  You were born in a country where hepatitis B occurs often. Talk with your health care provider about which countries are considered high risk.  Your parents were born in a high-risk country and you have not received a shot to  protect against hepatitis B (hepatitis B vaccine).  You have HIV or AIDS.  You use needles to inject street drugs.  You live with, or have sex with, someone who has hepatitis B.  You are a man who has sex with other men (MSM).  You get hemodialysis treatment.  You take certain medicines for conditions like cancer, organ transplantation, and autoimmune conditions.  Hepatitis C blood testing is recommended for all people born from [birth year] through 1967 and any individual with known risk factors for hepatitis C.  Healthy men should no longer receive prostate-specific antigen (PSA) blood tests as part of routine cancer screening. Talk to your health care provider about prostate cancer screening.  Testicular cancer screening is not recommended for adolescents or adult males who have no symptoms. Screening includes self-exam, a health care provider exam, and other screening tests. Consult  with your health care provider about any symptoms you have or any concerns you have about testicular cancer.  Practice safe sex. Use condoms and avoid high-risk sexual practices to reduce the spread of sexually transmitted infections (STIs).  You should be screened for STIs, including gonorrhea and chlamydia if:  You are sexually active and are younger than 24 years.  You are older than 24 years, and your health care provider tells you that you are at risk for this type of infection.  Your sexual activity has changed since you were last screened, and you are at an increased risk for chlamydia or gonorrhea. Ask your health care provider if you are at risk.  If you are at risk of being infected with HIV, it is recommended that you take a prescription medicine daily to prevent HIV infection. This is called pre-exposure prophylaxis (PrEP). You are considered at risk if:  You are a man who has sex with other men (MSM).  You are a heterosexual man who is sexually active with multiple partners.  You take drugs by  injection.  You are sexually active with a partner who has HIV.  Talk with your health care provider about whether you are at high risk of being infected with HIV. If you choose to begin PrEP, you should first be tested for HIV. You should then be tested every 3 months for as long as you are taking PrEP.  Use sunscreen. Apply sunscreen liberally and repeatedly throughout the day. You should seek shade when your shadow is shorter than you. Protect yourself by wearing long sleeves, pants, a wide-brimmed hat, and sunglasses year round whenever you are outdoors.  Tell your health care provider of new moles or changes in moles, especially if there is a change in shape or color. Also, tell your health care provider if a mole is larger than the size of a pencil eraser.  A one-time screening for abdominal aortic aneurysm (AAA) and surgical repair of large AAAs by ultrasound is recommended for men aged 40-75 years who are current or former smokers.  Stay current with your vaccines (immunizations).   This information is not intended to replace advice given to you by your health care provider. Make sure you discuss any questions you have with your health care provider.   Document Released: 02/14/2008 Document Revised: 09/08/2014 Document Reviewed: 01/13/2011 Elsevier Interactive Patient Education [employer].

## 2016-02-22 NOTE — Progress Notes (Signed)
Pre visit review using our clinic review tool, if applicable. No additional management support is needed unless otherwise documented below in the visit note. 

## 2016-02-26 ENCOUNTER — Encounter: Payer: Self-pay | Admitting: Diagnostic Neuroimaging

## 2016-02-26 ENCOUNTER — Ambulatory Visit (INDEPENDENT_AMBULATORY_CARE_PROVIDER_SITE_OTHER): Payer: 59 | Admitting: Diagnostic Neuroimaging

## 2016-02-26 VITALS — BP 113/81 | HR 97 | Ht 68.0 in | Wt 202.6 lb

## 2016-02-26 DIAGNOSIS — G4700 Insomnia, unspecified: Secondary | ICD-10-CM

## 2016-02-26 DIAGNOSIS — G43109 Migraine with aura, not intractable, without status migrainosus: Secondary | ICD-10-CM

## 2016-02-26 DIAGNOSIS — F319 Bipolar disorder, unspecified: Secondary | ICD-10-CM

## 2016-02-26 DIAGNOSIS — F3130 Bipolar disorder, current episode depressed, mild or moderate severity, unspecified: Secondary | ICD-10-CM | POA: Diagnosis not present

## 2016-02-26 DIAGNOSIS — F5104 Psychophysiologic insomnia: Secondary | ICD-10-CM

## 2016-02-26 NOTE — Patient Instructions (Signed)
Thank you for coming to see us at [facility]. I hope we have been able to provide you high quality care today.  You may receive a patient satisfaction survey over the next few weeks. We would appreciate your feedback and comments so that we may continue to improve ourselves and the health of our patients.  To prevent or relieve headaches, try the following:  Cool Compress. Lie down and place a cool compress on your head.   Avoid headache triggers. If certain foods or odors seem to have triggered your migraines in the past, avoid them. A headache diary might help you identify triggers.   Include physical activity in your daily routine.   Manage stress. Find healthy ways to cope with the stressors, such as delegating tasks on your to-do list.   Practice relaxation techniques. Try deep breathing, yoga, massage and visualization.   Eat regularly. Eating regularly scheduled meals and maintaining a healthy diet might help prevent headaches. Also, drink plenty of fluids.   Follow a regular sleep schedule. Sleep deprivation might contribute to headaches  Consider biofeedback. With this mind-body technique, you learn to control certain bodily functions - such as muscle tension, heart rate and blood pressure - to prevent headaches or reduce headache pain.   ~~~~~~~~~~~~~~~~~~~~~~~~~~~~~~~~~~~~~~~~~~~~~~~~~~~~~~~~~~~~~~~~~  DR. 'S GUIDE TO HAPPY AND HEALTHY LIVING These are some of my general health and wellness recommendations. Some of them may apply to you better than others. Please use common sense as you try these suggestions and feel free to ask me any questions.   ACTIVITY/FITNESS Mental, social, emotional and physical stimulation are very important for brain and body health. Try learning a new activity (arts, music, language, sports, games).  Keep moving your body to the best of your abilities. You can do this at home, inside or outside, the park,  community center, gym or anywhere you like. Consider a physical therapist or personal trainer to get started. Consider the app Sworkit. Fitness trackers such as smart-watches, smart-phones or Fitbits can help as well.   NUTRITION Eat more plants: colorful vegetables, nuts, seeds and berries.  Eat less sugar, salt, preservatives and processed foods.  Avoid toxins such as cigarettes and alcohol.  Drink water when you are thirsty. Warm water with a slice of lemon is an excellent morning drink to start the day.  Consider these websites for more information The Nutrition Source (https://www.hsph.[employer].edu/nutritionsource) Precision Nutrition (www.precisionnutrition.com/blog/infographics)   RELAXATION Consider practicing mindfulness meditation or other relaxation techniques such as deep breathing, prayer, yoga, tai chi, massage. See website mindful.org or the apps Headspace or Calm to help get started.   SLEEP Try to get at least 7-8+ hours sleep per day. Regular exercise and reduced caffeine will help you sleep better. Practice good sleep hygeine techniques. See website sleep.org for more information.   PLANNING Prepare estate planning, living will, healthcare POA documents. Sometimes this is best planned with the help of an attorney. Theconversationproject.org and agingwithdignity.org are excellent resources.  

## 2016-02-26 NOTE — Progress Notes (Signed)
[facility]  PATIENT: Sean Wu DOB: Oct 22, 1970  REFERRING CLINICIAN: Anselmo Pickler HISTORY FROM: patient  REASON FOR VISIT: new consult    HISTORICAL  CHIEF COMPLAINT:  Chief Complaint  Patient presents with  . Headache    rm 7, New Pt, "migraines as a teen, HA back x 4-5 yrs, [facility] to [facility] but meds not helping; have HA almost daily after 3 pm; have to take 2 Benadryl and go to bed, can cause blurred vision/n/v"    HISTORY OF PRESENT ILLNESS:   46 year old male here for evaluation of headaches. Patient has headaches as a teenager and was diagnosed migraine headaches. He was having severe intense headaches, frontal and temporal, associate with nausea, vomiting, photophobia, phonophobia. Sometimes pounding and sharp pain. Sometimes pulling hair sensation, neck tightness, sinus pressure, blurred vision, sometimes preceded by "fireworks" sensation in his visual fields.  From 2015 2017 patient was tried on topiramate, baclofen, zonisamide without relief. Patient also had significant side effects of topiramate. [facility]. Patient was getting tired of "medicines being thrown at him".  Patient also has history of bipolar disorder, depression, anxiety, gastroparesis, degenerative disc disease, currently on medical therapy. He does see a psychiatrist. Bipolar disorder is stable per patient.  Patient also having his psychosocial stressors related to being unemployed, social withdrawal, disinterest in activities.   REVIEW OF SYSTEMS: Full 14 system review of systems performed and negative with exception of: Weight gain fatigue spinning sensation trouble swallowing diarrhea constipation urination problems impotence allergies runny nose aching muscles feeling hot increased thirst easy bruising memory loss confusion headache weakness dizziness difficulty swallowing passing out insomnia sleepiness depression anxiety not  asleep decreased energy disinterest in activities change in appetite racing thoughts.   ALLERGIES: Allergies  Allergen Reactions  . Topamax [Topiramate]     Sleep walking, hallucinations, "I get mean, can't help it"    HOME MEDICATIONS: Outpatient Prescriptions Prior to Visit  Medication Sig Dispense Refill  . ALPRAZolam (XANAX) 1 MG tablet Take 1 tablet as needed    . AMITIZA 8 MCG capsule TAKE 1 CAPSULE BY MOUTH TWICE DAILY WITH A MEAL 60 capsule 2  . Asenapine Maleate (SAPHRIS) 10 MG SUBL Place 20 mg under the tongue daily.    Hinda Kehr 30 MG/ACT SOLN APPLY 2 PUMPS TO AFFECTED AREA ONCE EVERY MORNING 90 mL 2  . baclofen (LIORESAL) 10 MG tablet TAKE 1/2 TO 1 TAB UP TO TWICE A DAY AS NEEDED FOR HEADACHE *LIMIT TO 2 DAYS PER WEEK.AVOID DAILY USE  0  . cloNIDine (CATAPRES) 0.1 MG tablet TAKE 1 OR 2 TABLETS BY MOUTH AT BEDTIME  12  . diphenhydrAMINE (BENADRYL) 25 mg capsule Take 50 mg by mouth every 6 (six) hours as needed.    . DULoxetine (CYMBALTA) 60 MG capsule Take 60 mg by mouth every morning.  3  . lamoTRIgine (LAMICTAL) 150 MG tablet Take 300 mg by mouth daily.     . Linaclotide (LINZESS) 290 MCG CAPS capsule Take 1 capsule (290 mcg total) by mouth daily. 30 capsule 11  . metoCLOPramide (REGLAN) 10 MG tablet Take 1 tablet (10 mg total) by mouth 4 (four) times daily -  before meals and at bedtime. 40 tablet 0  . naltrexone (DEPADE) 50 MG tablet Take 50 mg by mouth daily.  3  . omeprazole (PRILOSEC) 20 MG capsule TAKE 1 CAPSULE (20 MG TOTAL) BY MOUTH DAILY. 90 capsule 2  . prochlorperazine (COMPAZINE) 10 MG  tablet TAKE 1 TABLET BY MOUTH EVERY 6 HOURS AS NEEDED 30 tablet 1  . sildenafil (VIAGRA) 100 MG tablet Take 1 tablet (100 mg total) by mouth daily as needed for erectile dysfunction. 6 tablet 2  . traMADol (ULTRAM) 50 MG tablet TAKE 1 TABLET BY MOUTH 3 TIMES A DAY AS NEEDED FOR PAIN 30 tablet 2  . zolpidem (AMBIEN) 10 MG tablet     . zonisamide (ZONEGRAN) 50 MG capsule TAKE 3  CAPSULES EVERY DAY  1   No facility-administered medications prior to visit.    PAST MEDICAL HISTORY: Past Medical History  Diagnosis Date  . TESTICULAR HYPOFUNCTION 06/12/2009  . SMOKER 06/12/2009  . DEPRESSION 05/10/2009  . ALLERGIC RHINITIS 05/10/2009  . GERD 05/10/2009  . WEIGHT GAIN 05/10/2009  . LIBIDO, DECREASED 05/10/2009  . Osgood-Schlatter's disease   . Anxiety   . Chronic headaches   . Gastroparesis   . DDD (degenerative disc disease)   . SLEEP APNEA, OBSTRUCTIVE 06/12/2009    CPAP not utilized after 30 lb. weight loss  . Bipolar disorder ([place])     see medications patient to bring  . Neuromuscular disorder ([place])     PAST SURGICAL HISTORY: Past Surgical History  Procedure Laterality Date  . Lasik Bilateral   . Ankle surgery Left   . Colonoscopy  2014    normal  . Eye surgery      eye implant  . Fracture surgery      surgery on right ankle for ligament torn 2013  . Shoulder arthroscopy with distal clavicle resection Right 02/26/2015    Procedure: SHOULDER DIAGNOSTIC ARTHROSCOPY WITH OPEN DISTAL CLAVICLE EXCISION;  Surgeon: Tania Ade, MD;  Location: [facility];  Service: Orthopedics;  Laterality: Right;  Right diagnostic arthroscopy, open distal clavical excision    FAMILY HISTORY: Family History  Problem Relation Age of Onset  . Heart disease Maternal Grandfather   . Irritable bowel syndrome Father   . Alcoholism Father   . Heart attack Father   . Bipolar disorder Father   . Colon cancer Neg Hx   . Rectal cancer Neg Hx   . Stomach cancer Neg Hx     SOCIAL HISTORY:  Social History   Social History  . Marital Status: Married    Spouse Name: [name]  . Number of Children: 0  . Years of Education: 14   Occupational History  . Unemployeed    Social History Main Topics  . Smoking status: Current Every Day Smoker -- 0.50 packs/day for 15 years    Types: Cigarettes  . Smokeless tobacco: Former Systems developer    Types: [place] date:  09/01/1998     Comment: tobacco info given 08/01/13  . Alcohol Use: 0.6 oz/week    1 Cans of beer, 0 Standard drinks or equivalent per week     Comment: social, rare  . Drug Use: No  . Sexual Activity: Yes   Other Topics Concern  . Not on file   Social History Narrative   Lives with wife at home   Caffeine use- tea, 3 glasses daily     PHYSICAL EXAM   GENERAL EXAM/CONSTITUTIONAL: Vitals:  Filed Vitals:   02/26/16 1151  BP: 113/81  Pulse: 97  Height: 5\' 8"  (1.727 m)  Weight: 202 lb 9.6 oz (91.899 kg)     Body mass index is 30.81 kg/(m^2).  No exam data present  Patient is in no distress; well developed, nourished and groomed; neck  is supple  CARDIOVASCULAR:  Examination of carotid arteries is normal; no carotid bruits  Regular rate and rhythm, no murmurs  Examination of peripheral vascular system by observation and palpation is normal  EYES:  Ophthalmoscopic exam of optic discs and posterior segments is normal; no papilledema or hemorrhages  MUSCULOSKELETAL:  Gait, strength, tone, movements noted in Neurologic exam below  NEUROLOGIC: MENTAL STATUS:  MMSE - Mini Mental State Exam 01/16/2015  Orientation to time 5  Orientation to Place 5  Registration 3  Attention/ Calculation 5  Recall 2  Language- name 2 objects 2  Language- repeat 1  Language- follow 3 step command 3  Language- read & follow direction 1  Write a sentence 1  Copy design 1  Total score 29    awake, alert, oriented to person, place and time  recent and remote memory intact  normal attention and concentration  language fluent, comprehension intact, naming intact,   fund of knowledge appropriate  CRANIAL NERVE:   2nd - no papilledema on fundoscopic exam  2nd, 3rd, 4th, 6th - pupils equal and reactive to light, visual fields full to confrontation, extraocular muscles intact, no nystagmus  5th - facial sensation symmetric  7th - facial strength symmetric  8th - hearing  intact  9th - palate elevates symmetrically, uvula midline  11th - shoulder shrug symmetric  12th - tongue protrusion midline  MOTOR:   normal bulk and tone, full strength in the BUE, BLE  SENSORY:   normal and symmetric to light touch, temperature, vibration  COORDINATION:   finger-nose-finger, fine finger movements normal  REFLEXES:   deep tendon reflexes present and symmetric  GAIT/STATION:   narrow based gait    DIAGNOSTIC DATA (LABS, IMAGING, TESTING) - I reviewed patient records, labs, notes, testing and imaging myself where available.  Lab Results  Component Value Date   WBC 6.2 02/22/2016   HGB 15.0 02/22/2016   HCT 44.5 02/22/2016   MCV 90.7 02/22/2016   PLT 283.0 02/22/2016      Component Value Date/Time   NA 141 02/22/2016 0932   K 3.7 02/22/2016 0932   CL 109 02/22/2016 0932   CO2 23 02/22/2016 0932   GLUCOSE 92 02/22/2016 0932   BUN 10 02/22/2016 0932   CREATININE 1.01 02/22/2016 0932   CALCIUM 9.1 02/22/2016 0932   PROT 6.3 02/22/2016 0932   ALBUMIN 3.9 02/22/2016 0932   AST 16 02/22/2016 0932   ALT 15 02/22/2016 0932   ALKPHOS 56 02/22/2016 0932   BILITOT 0.4 02/22/2016 0932   GFRNONAA 89* 05/14/2014 2327   GFRAA >90 05/14/2014 2327   Lab Results  Component Value Date   CHOL 240* 02/22/2016   HDL 40.30 02/22/2016   LDLDIRECT 141.0 02/22/2016   TRIG 226.0* 02/22/2016   CHOLHDL 6 02/22/2016   No results found for: HGBA1C Lab Results  Component Value Date   VITAMINB12 390 01/16/2015   Lab Results  Component Value Date   TSH 0.66 02/22/2016    02/25/15 MRI lumbar spine [I reviewed images myself and agree with interpretation. -VRP]  1. New fatty atrophy of the left posterior paraspinal musculature from L3 through S3 indicating denervation. The possibility of that the patient had localized myositis should be considered. Has the patient ever had facet injections or epidural injections? 2. Chronic small central disc protrusion at  L5-S1 with no neural Impingement.  02/25/15 MRI brain [I reviewed images myself and agree with interpretation. -VRP]  1. No acute intracranial abnormality or mass.  2. Minimal white matter disease in the frontal lobes, nonspecific. Considerations include early chronic small vessel ischemia, migraines, sequelae of trauma, hypercoagulable state, vasculitis, prior infection or less likely demyelination.    ASSESSMENT AND PLAN  45 y.o. year old male here with history of chronic pain, bipolar disorder, gastroparesis, migraine headaches, here for evaluation of intractable headaches. Patient has tried and failed medical therapy of migraine headaches. Had long conversation with patient regarding nonmedication techniques for headache and insomnia management. We talked about nutrition, physical activity and fitness, relaxation techniques, pursuing personal interests and fulfilling activities.  Dx:  1. Migraine with aura and without status migrainosus, not intractable   2. Chronic insomnia   3. Bipolar disorder with depression ([place])      PLAN:  - Reviewed nutrition, fitness, relaxation techniques  - Advised non-medication techniques for headache and insomnia management  - To prevent or relieve headaches, try the following:  Cool Compress. Lie down and place a cool compress on your head.   Avoid headache triggers. If certain foods or odors seem to have triggered your migraines in the past, avoid them. A headache diary might help you identify triggers.   Include physical activity in your daily routine.   Manage stress. Find healthy ways to cope with the stressors, such as delegating tasks on your to-do list.   Practice relaxation techniques. Try deep breathing, yoga, massage and visualization.   Eat regularly. Eating regularly scheduled meals and maintaining a healthy diet might help prevent headaches. Also, drink plenty of fluids.   Follow a regular sleep schedule. Sleep deprivation  might contribute to headaches  Consider biofeedback. With this mind-body technique, you learn to control certain bodily functions - such as muscle tension, heart rate and blood pressure - to prevent headaches or reduce headache pain.  Return in about 6 months (around 08/27/2016).    Penni Bombard, MD [id number], [id number] PM Certified in Neurology, Neurophysiology and Neuroimaging  [facility] [address] [place], [place] [address] [phone]

## 2016-03-06 ENCOUNTER — Other Ambulatory Visit: Payer: Self-pay | Admitting: Internal Medicine

## 2016-03-06 ENCOUNTER — Other Ambulatory Visit: Payer: Self-pay

## 2016-03-06 MED ORDER — PROCHLORPERAZINE MALEATE 10 MG PO TABS: 10.0000 mg | ORAL_TABLET | Freq: Four times a day (QID) | ORAL | Status: DC | PRN

## 2016-03-06 MED ORDER — TRAMADOL HCL 50 MG PO TABS: ORAL_TABLET | ORAL | Status: DC

## 2016-03-06 NOTE — Telephone Encounter (Signed)
Last refill 12/03/2015 #30, 2rf Last OV 12/11/2015 Acute No pending scheduled Please advise on refill

## 2016-03-06 NOTE — Telephone Encounter (Signed)
Pharmacy called to follow up on request

## 2016-03-07 NOTE — Telephone Encounter (Signed)
What medicine is being requested for refill??

## 2016-04-26 ENCOUNTER — Other Ambulatory Visit: Payer: Self-pay | Admitting: Internal Medicine

## 2016-04-28 ENCOUNTER — Ambulatory Visit (INDEPENDENT_AMBULATORY_CARE_PROVIDER_SITE_OTHER): Payer: 59 | Admitting: Internal Medicine

## 2016-04-28 ENCOUNTER — Encounter: Payer: Self-pay | Admitting: Internal Medicine

## 2016-04-28 VITALS — BP 120/90 | HR 93 | Temp 98.6°F | Resp 20 | Ht 68.0 in | Wt 208.5 lb

## 2016-04-28 DIAGNOSIS — R079 Chest pain, unspecified: Secondary | ICD-10-CM | POA: Diagnosis not present

## 2016-04-28 DIAGNOSIS — M5416 Radiculopathy, lumbar region: Secondary | ICD-10-CM | POA: Diagnosis not present

## 2016-04-28 NOTE — Progress Notes (Signed)
Pre visit review using our clinic review tool, if applicable. No additional management support is needed unless otherwise documented below in the visit note. 

## 2016-04-28 NOTE — Progress Notes (Signed)
Subjective:    Patient ID: Sean Wu, male    DOB: 23-Nov-1970, 45 y.o.   MRN: [id number]  HPI 45 year old patient who presents with a chief complaint of left-sided chest discomfort.  He awoke with this discomfort to 2 days ago.  Pain is described as sharp and aggravated by deep inspiration and movement.  There is been no trauma or unusual activities.  No fever, URI symptoms or shortness of breath.  Past Medical History:  Diagnosis Date  . ALLERGIC RHINITIS 05/10/2009  . Anxiety   . Bipolar disorder ([place])    see medications patient to bring  . Chronic headaches   . DDD (degenerative disc disease)   . DEPRESSION 05/10/2009  . Gastroparesis   . GERD 05/10/2009  . LIBIDO, DECREASED 05/10/2009  . Neuromuscular disorder ([place])   . Osgood-Schlatter's disease   . SLEEP APNEA, OBSTRUCTIVE 06/12/2009   CPAP not utilized after 30 lb. weight loss  . SMOKER 06/12/2009  . TESTICULAR HYPOFUNCTION 06/12/2009  . WEIGHT GAIN 05/10/2009     Social History   Social History  . Marital status: Married    Spouse name: [name]  . Number of children: 0  . Years of education: 14   Occupational History  . Unemployeed    Social History Main Topics  . Smoking status: Current Every Day Smoker    Packs/day: 0.50    Years: 15.00    Types: Cigarettes  . Smokeless tobacco: Former Systems developer    Types: [place] date: 09/01/1998     Comment: tobacco info given 08/01/13  . Alcohol use 0.6 oz/week    1 Cans of beer per week     Comment: social, rare  . Drug use: No  . Sexual activity: Yes   Other Topics Concern  . Not on file   Social History Narrative   Lives with wife at home   Caffeine use- tea, 3 glasses daily    Past Surgical History:  Procedure Laterality Date  . ANKLE SURGERY Left   . COLONOSCOPY  2014   normal  . EYE SURGERY     eye implant  . FRACTURE SURGERY     surgery on right ankle for ligament torn 2013  . LASIK Bilateral   . SHOULDER ARTHROSCOPY WITH DISTAL CLAVICLE RESECTION  Right 02/26/2015   Procedure: SHOULDER DIAGNOSTIC ARTHROSCOPY WITH OPEN DISTAL CLAVICLE EXCISION;  Surgeon: Tania Ade, MD;  Location: [facility];  Service: Orthopedics;  Laterality: Right;  Right diagnostic arthroscopy, open distal clavical excision    Family History  Problem Relation Age of Onset  . Heart disease Maternal Grandfather   . Irritable bowel syndrome Father   . Alcoholism Father   . Heart attack Father   . Bipolar disorder Father   . Colon cancer Neg Hx   . Rectal cancer Neg Hx   . Stomach cancer Neg Hx     Allergies  Allergen Reactions  . Topamax [Topiramate]     Sleep walking, hallucinations, "I get mean, can't help it"    Current Outpatient Prescriptions on File Prior to Visit  Medication Sig Dispense Refill  . ALPRAZolam (XANAX) 1 MG tablet Take 1 tablet as needed    . AMITIZA 8 MCG capsule TAKE 1 CAPSULE BY MOUTH TWICE DAILY WITH A MEAL 60 capsule 2  . Asenapine Maleate (SAPHRIS) 10 MG SUBL Place 20 mg under the tongue daily.    Hinda Kehr 30 MG/ACT SOLN APPLY 2 PUMPS TO AFFECTED  AREA ONCE EVERY MORNING 90 mL 2  . baclofen (LIORESAL) 10 MG tablet TAKE 1/2 TO 1 TAB UP TO TWICE A DAY AS NEEDED FOR HEADACHE *LIMIT TO 2 DAYS PER WEEK.AVOID DAILY USE  0  . cloNIDine (CATAPRES) 0.1 MG tablet TAKE 1 OR 2 TABLETS BY MOUTH AT BEDTIME  12  . diphenhydrAMINE (BENADRYL) 25 mg capsule Take 50 mg by mouth every 6 (six) hours as needed.    . DULoxetine (CYMBALTA) 60 MG capsule Take 60 mg by mouth every morning.  3  . lamoTRIgine (LAMICTAL) 150 MG tablet Take 300 mg by mouth daily.     . Linaclotide (LINZESS) 290 MCG CAPS capsule Take 1 capsule (290 mcg total) by mouth daily. 30 capsule 11  . metoCLOPramide (REGLAN) 10 MG tablet Take 1 tablet (10 mg total) by mouth 4 (four) times daily -  before meals and at bedtime. 40 tablet 0  . naltrexone (DEPADE) 50 MG tablet Take 50 mg by mouth daily.  3  . omeprazole (PRILOSEC) 20 MG capsule TAKE 1 CAPSULE (20 MG TOTAL)  BY MOUTH DAILY. 90 capsule 2  . prochlorperazine (COMPAZINE) 10 MG tablet TAKE 1 TABLET (10 MG TOTAL) BY MOUTH EVERY 6 (SIX) HOURS AS NEEDED. 30 tablet 1  . sildenafil (VIAGRA) 100 MG tablet Take 1 tablet (100 mg total) by mouth daily as needed for erectile dysfunction. 6 tablet 2  . traMADol (ULTRAM) 50 MG tablet TAKE 1 TABLET BY MOUTH 3 TIMES A DAY AS NEEDED FOR PAIN 30 tablet 2  . zolpidem (AMBIEN) 10 MG tablet     . [DISCONTINUED] omeprazole (PRILOSEC OTC) 20 MG tablet 1 tablet twice daily. Do not eat for one hour after ingestion of the medication 60 tablet 11   No current facility-administered medications on file prior to visit.     BP 120/90 (BP Location: Left Arm, Patient Position: Sitting, Cuff Size: Normal)   Pulse 93   Temp 98.6 F (37 C) (Oral)   Resp 20   Ht 5\' 8"  (1.727 m)   Wt 208 lb 8 oz (94.6 kg)   SpO2 97%   BMI 31.70 kg/m      Review of Systems  Constitutional: Negative for appetite change, chills, fatigue and fever.  HENT: Negative for congestion, dental problem, ear pain, hearing loss, sore throat, tinnitus, trouble swallowing and voice change.   Eyes: Negative for pain, discharge and visual disturbance.  Respiratory: Negative for cough, chest tightness, shortness of breath, wheezing and stridor.   Cardiovascular: Positive for chest pain. Negative for palpitations and leg swelling.  Gastrointestinal: Negative for abdominal distention, abdominal pain, blood in stool, constipation, diarrhea, nausea and vomiting.  Genitourinary: Negative for difficulty urinating, discharge, flank pain, genital sores, hematuria and urgency.  Musculoskeletal: Negative for arthralgias, back pain, gait problem, joint swelling, myalgias and neck stiffness.  Skin: Negative for rash.  Neurological: Negative for dizziness, syncope, speech difficulty, weakness, numbness and headaches.  Hematological: Negative for adenopathy. Does not bruise/bleed easily.  Psychiatric/Behavioral: Negative  for behavioral problems and dysphoric mood. The patient is not nervous/anxious.        Objective:   Physical Exam  Constitutional: He is oriented to person, place, and time. He appears well-developed.  Comfortable No distress No tachycardia  HENT:  Head: Normocephalic.  Right Ear: External ear normal.  Left Ear: External ear normal.  Eyes: Conjunctivae and EOM are normal.  Neck: Normal range of motion.  Cardiovascular: Normal rate, regular rhythm and normal heart sounds.  Pulmonary/Chest: Effort normal and breath sounds normal. He exhibits tenderness.  No rub Breath sounds symmetrical Mild tenderness over the left lateral lower chest wall area  Abdominal: Bowel sounds are normal.  Mild left upper quadrant discomfort but no guarding.  Bowel sounds active  Musculoskeletal: Normal range of motion. He exhibits no edema or tenderness.  Neurological: He is alert and oriented to person, place, and time.  Psychiatric: He has a normal mood and affect. His behavior is normal.          Assessment & Plan:   Left chest wall pain.  The patient will take Aleve twice daily.  He does have tramadol for additional pain control if needed.  However, there is no pain at rest.  He will report any new or worsening symptoms  Nyoka Cowden, MD

## 2016-05-11 ENCOUNTER — Other Ambulatory Visit: Payer: Self-pay | Admitting: Internal Medicine

## 2016-06-22 ENCOUNTER — Other Ambulatory Visit: Payer: Self-pay | Admitting: Internal Medicine

## 2016-06-22 ENCOUNTER — Other Ambulatory Visit: Payer: Self-pay | Admitting: Gastroenterology

## 2016-06-24 ENCOUNTER — Ambulatory Visit (INDEPENDENT_AMBULATORY_CARE_PROVIDER_SITE_OTHER): Payer: 59 | Admitting: Physician Assistant

## 2016-06-24 ENCOUNTER — Encounter: Payer: Self-pay | Admitting: Physician Assistant

## 2016-06-24 VITALS — BP 128/78 | HR 80 | Ht 68.0 in | Wt 207.2 lb

## 2016-06-24 DIAGNOSIS — R110 Nausea: Secondary | ICD-10-CM | POA: Diagnosis not present

## 2016-06-24 DIAGNOSIS — K219 Gastro-esophageal reflux disease without esophagitis: Secondary | ICD-10-CM

## 2016-06-24 DIAGNOSIS — K3184 Gastroparesis: Secondary | ICD-10-CM

## 2016-06-24 MED ORDER — PANTOPRAZOLE SODIUM 40 MG PO TBEC: 40.0000 mg | DELAYED_RELEASE_TABLET | Freq: Two times a day (BID) | ORAL | 6 refills | Status: DC

## 2016-06-24 MED ORDER — METOCLOPRAMIDE HCL 5 MG PO TABS: ORAL_TABLET | ORAL | 2 refills | Status: DC

## 2016-06-24 NOTE — Progress Notes (Signed)
I agree with the above note, plan 

## 2016-06-24 NOTE — Patient Instructions (Addendum)
We sent prescriptions to [facility] [address]  1. Pantoprazole sodium 40 mg 2. Reglan  5 mg.   Continue the Linzess 290 mcg.  We have provided you with Antireflux pamphlets.  Stop the Omeprazole when you start the Pantoprazole sodium ( Protonix).   You have been scheduled for an endoscopy. Please follow written instructions given to you at your visit today. If you use inhalers (even only as needed), please bring them with you on the day of your procedure. Your physician has requested that you go to www.[email or web address].com and enter the access code given to you at your visit today. This web site gives a general overview about your procedure. However, you should still follow specific instructions given to you by our office regarding your preparation for the procedure.

## 2016-06-24 NOTE — Progress Notes (Signed)
Subjective:    Patient ID: Sean Wu, male    DOB: 11/12/1970, 45 y.o.   MRN: [id number]  HPI Sean Wu is a pleasant 45 year old white male known to Dr. Ardis Hughs. He has history of bipolar disorder, memory loss, obstructive sleep apnea, peripheral neuropathy and has been seen in GI for management of chronic constipation and gastroparesis. L Last office visit was June 2016. He had colonoscopy in March 2014 pertinent only for diverticulosis and EGD in 2014 with H. pylori negative gastritis. He also had gastric emptying scan in 2014 which showed 44% retention at 2 hours Patient comes in today with complaints of ongoing daily constant nausea over the past 2 months. He says that he been given a prescription for Compazine by his PCP but that is not helping. Not remember ever taking Reglan or whether or not it helped. He continues to take Linzess 290 mcg ,and says it works great. He has been having ongoing nausea without vomiting. Appetite has been "up and down" as has his weight. He does admit to some early satiety symptoms and has been having significant increase in heartburn and indigestion since the nausea started. He also has had some intermittent dysphagia with solid foods and occasionally with pills. He has had episodes of regurgitation recently.   He has been on omeprazole 20 mg by mouth every morning long term. No chronic NSAID use. He is on multiple psychotropic medications.  Review of Systems Pertinent positive and negative review of systems were noted in the above HPI section.  All other review of systems was otherwise negative.  Outpatient Encounter Prescriptions as of 06/24/2016  Medication Sig  . Asenapine Maleate (SAPHRIS) 10 MG SUBL Place 20 mg under the tongue daily.  Hinda Kehr 30 MG/ACT SOLN APPLY 2 PUMPS TO AFFECTED AREA ONCE EVERY MORNING  . cloNIDine (CATAPRES) 0.1 MG tablet TAKE 1 OR 2 TABLETS BY MOUTH AT BEDTIME  . diphenhydrAMINE (BENADRYL) 25 mg capsule Take 50 mg by mouth every  6 (six) hours as needed.  . DULoxetine (CYMBALTA) 60 MG capsule Take 60 mg by mouth every morning.  . lamoTRIgine (LAMICTAL) 150 MG tablet Take 300 mg by mouth daily.   Marland Kitchen LINZESS 290 MCG CAPS capsule TAKE 1 CAPSULE (290 MCG TOTAL) BY MOUTH DAILY.  . naltrexone (DEPADE) 50 MG tablet Take 50 mg by mouth daily.  Marland Kitchen omeprazole (PRILOSEC) 20 MG capsule TAKE 1 CAPSULE (20 MG TOTAL) BY MOUTH DAILY.  Marland Kitchen prochlorperazine (COMPAZINE) 10 MG tablet TAKE 1 TABLET (10 MG TOTAL) BY MOUTH EVERY 6 (SIX) HOURS AS NEEDED.  Marland Kitchen sildenafil (VIAGRA) 100 MG tablet Take 1 tablet (100 mg total) by mouth daily as needed for erectile dysfunction.  . traMADol (ULTRAM) 50 MG tablet TAKE 1 TABLET 3 TIMES A DAY AS NEEDED  . zolpidem (AMBIEN) 10 MG tablet   . ALPRAZolam (XANAX) 1 MG tablet Take 1 tablet as needed  . metoCLOPramide (REGLAN) 5 MG tablet Take 1/2 to 1 hour before every meal.  . pantoprazole (PROTONIX) 40 MG tablet Take 1 tablet (40 mg total) by mouth 2 (two) times daily.  . [DISCONTINUED] AMITIZA 8 MCG capsule TAKE 1 CAPSULE BY MOUTH TWICE DAILY WITH A MEAL (Patient not taking: Reported on 06/24/2016)  . [DISCONTINUED] baclofen (LIORESAL) 10 MG tablet TAKE 1/2 TO 1 TAB UP TO TWICE A DAY AS NEEDED FOR HEADACHE *LIMIT TO 2 DAYS PER WEEK.AVOID DAILY USE  . [DISCONTINUED] metoCLOPramide (REGLAN) 10 MG tablet Take 1 tablet (10 mg total) by  mouth 4 (four) times daily -  before meals and at bedtime. (Patient not taking: Reported on 06/24/2016)  . [DISCONTINUED] prochlorperazine (COMPAZINE) 10 MG tablet TAKE 1 TABLET (10 MG TOTAL) BY MOUTH EVERY 6 (SIX) HOURS AS NEEDED.   No facility-administered encounter medications on file as of 06/24/2016.    Allergies  Allergen Reactions  . Topamax [Topiramate]     Sleep walking, hallucinations, "I get mean, can't help it"   Patient Active Problem List   Diagnosis Date Noted  . Anxiety 04/10/2015  . OSA on CPAP 03/30/2015  . Memory loss 01/16/2015  . Bipolar disorder with  depression ([place]) 01/16/2015  . Constipation, chronic 01/05/2015  . Greater trochanteric bursitis of left hip 02/14/2014  . Lumbar radiculopathy, chronic 02/14/2014  . Radiculopathy 11/24/2013  . Unspecified hereditary and idiopathic peripheral neuropathy 11/24/2013  . Back pain 07/12/2013  . Gastroparesis 06/29/2013  . Abscess of arm, right 10/03/2010  . TESTICULAR HYPOFUNCTION 06/12/2009  . SMOKER 06/12/2009  . DEPRESSION 05/10/2009  . Allergic rhinitis 05/10/2009  . GERD 05/10/2009  . LIBIDO, DECREASED 05/10/2009   Social History   Social History  . Marital status: Married    Spouse name: [name]  . Number of children: 0  . Years of education: 14   Occupational History  . Unemployeed    Social History Main Topics  . Smoking status: Current Every Day Smoker    Packs/day: 0.50    Years: 15.00    Types: Cigarettes  . Smokeless tobacco: Former Systems developer    Types: [place] date: 09/01/1998     Comment: tobacco info given 08/01/13  . Alcohol use 0.6 oz/week    1 Cans of beer per week     Comment: social, rare  . Drug use: No  . Sexual activity: Yes   Other Topics Concern  . Not on file   Social History Narrative   Lives with wife at home   Caffeine use- tea, 3 glasses daily    [name] family history includes Alcoholism in his father; Bipolar disorder in his father; Heart attack in his father; Heart disease in his maternal grandfather; Irritable bowel syndrome in his father.      Objective:    Vitals:   06/24/16 1031  BP: 128/78  Pulse: 80    Physical Exam  well-developed white male in no acute distress, pleasant blood pressure 128/78 pulse 80, BMI 31.5. HEENT ;nontraumatic normocephalic EOMI PERRLA sclera anicteric, Cardiovascular; regular rate and rhythm with S1-S2 no murmur or gallop, Pulmonary; clear bilaterally, Abdomen; soft nontender no palpable mass or hepatosplenomegaly bowel sounds are present, Rectal; exam not done, Extremities; no clubbing cyanosis  or edema skin warm and dry, Neuropsych ;mood and affect appropriate       Assessment & Plan:   #51 45 year old white male with previously documented gastroparesis who has not been on any therapy for this presenting with 2 month history of constant nausea and some mild early satiety. Suspect exacerbation of gastroparesis #2 GERD with increased heartburn and indigestion 2 months again likely secondary to gastroparesis #3 intermittent solid food and pill dysphagia rule out stricture #4 chronic constipation-well controlled on Linzess 290 g daily #5 diverticulosis #6 bipolar disorder, memory loss and anxiety on multiple psychotropics  Plan; start step 3 gastroparesis diet and antireflux regimen Start Reglan 5 mg one half to one hour before  each meal-discussed potential of neurologic side effects with patient and advise him to stop the medication and call should  he have any unusual symptoms.. If Reglan is helpful may consider switching to domperidone longer-term Stop omeprazole and start Protonix 40 mg by mouth twice a day initially until symptoms controlled  Schedule for EGD with possible dilation with Dr. Ardis Hughs Procedure was discussed in detail with the patient including risks benefits and he is agreeable to proceed.   Zollie Clemence Genia Harold PA-C 06/24/2016   Cc: Marletta Lor, MD

## 2016-06-25 ENCOUNTER — Other Ambulatory Visit: Payer: Self-pay | Admitting: *Deleted

## 2016-06-25 DIAGNOSIS — K219 Gastro-esophageal reflux disease without esophagitis: Secondary | ICD-10-CM

## 2016-06-25 DIAGNOSIS — R110 Nausea: Secondary | ICD-10-CM

## 2016-06-25 DIAGNOSIS — R1310 Dysphagia, unspecified: Secondary | ICD-10-CM

## 2016-06-25 DIAGNOSIS — K3184 Gastroparesis: Secondary | ICD-10-CM

## 2016-08-01 ENCOUNTER — Ambulatory Visit (AMBULATORY_SURGERY_CENTER): Payer: 59 | Admitting: Gastroenterology

## 2016-08-01 ENCOUNTER — Encounter: Payer: Self-pay | Admitting: Gastroenterology

## 2016-08-01 VITALS — BP 107/73 | HR 76 | Temp 98.2°F | Resp 25 | Ht 68.0 in | Wt 204.0 lb

## 2016-08-01 DIAGNOSIS — R1013 Epigastric pain: Secondary | ICD-10-CM

## 2016-08-01 DIAGNOSIS — K2990 Gastroduodenitis, unspecified, without bleeding: Secondary | ICD-10-CM

## 2016-08-01 DIAGNOSIS — R110 Nausea: Secondary | ICD-10-CM | POA: Diagnosis present

## 2016-08-01 DIAGNOSIS — K2970 Gastritis, unspecified, without bleeding: Secondary | ICD-10-CM | POA: Diagnosis not present

## 2016-08-01 DIAGNOSIS — K2950 Unspecified chronic gastritis without bleeding: Secondary | ICD-10-CM | POA: Diagnosis not present

## 2016-08-01 DIAGNOSIS — K209 Esophagitis, unspecified without bleeding: Secondary | ICD-10-CM

## 2016-08-01 MED ORDER — SODIUM CHLORIDE 0.9 % IV SOLN: 500.0000 mL | INTRAVENOUS | Status: DC

## 2016-08-01 NOTE — Patient Instructions (Addendum)
YOU HAD AN ENDOSCOPIC PROCEDURE TODAY AT [facility]:   Refer to the procedure report that was given to you for any specific questions about what was found during the examination.  If the procedure report does not answer your questions, please call your gastroenterologist to clarify.  If you requested that your care partner not be given the details of your procedure findings, then the procedure report has been included in a sealed envelope for you to review at your convenience later.  YOU SHOULD EXPECT: Some feelings of bloating in the abdomen. Passage of more gas than usual.  Walking can help get rid of the air that was put into your GI tract during the procedure and reduce the bloating. If you had a lower endoscopy (such as a colonoscopy or flexible sigmoidoscopy) you may notice spotting of blood in your stool or on the toilet paper. If you underwent a bowel prep for your procedure, you may not have a normal bowel movement for a few days.  Please Note:  You might notice some irritation and congestion in your nose or some drainage.  This is from the oxygen used during your procedure.  There is no need for concern and it should clear up in a day or so.  SYMPTOMS TO REPORT IMMEDIATELY:    Following upper endoscopy (EGD)  Vomiting of blood or coffee ground material  New chest pain or pain under the shoulder blades  Painful or persistently difficult swallowing  New shortness of breath  Fever of 100F or higher  Black, tarry-looking stools  For urgent or emergent issues, a gastroenterologist can be reached at any hour by calling [phone].   DIET:  We do recommend a small meal at first, but then you may proceed to your regular diet.  Drink plenty of fluids but you should avoid alcoholic beverages for 24 hours.  ACTIVITY:  You should plan to take it easy for the rest of today and you should NOT DRIVE or use heavy machinery until tomorrow (because of the sedation medicines used  during the test).    FOLLOW UP: Our staff will call the number listed on your records the next business day following your procedure to check on you and address any questions or concerns that you may have regarding the information given to you following your procedure. If we do not reach you, we will leave a message.  However, if you are feeling well and you are not experiencing any problems, there is no need to return our call.  We will assume that you have returned to your regular daily activities without incident.  If any biopsies were taken you will be contacted by phone or by letter within the next 1-3 weeks.  Please call us at [phone] if you have not heard about the biopsies in 3 weeks.    SIGNATURES/CONFIDENTIALITY: You and/or your care partner have signed paperwork which will be entered into your electronic medical record.  These signatures attest to the fact that that the information above on your After Visit Summary has been reviewed and is understood.  Full responsibility of the confidentiality of this discharge information lies with you and/or your care-partner.  Gastritist, esophagitis-handout given  Continue to take reglan 5 mg just prior to meals(3 times daily), take protonix 40mg  20-30 mins before breakfast every morning.  Start ranitidine 150 mg over the counter 1 pill every night at bedtime.

## 2016-08-01 NOTE — Progress Notes (Signed)
Called to room to assist during endoscopic procedure.  Patient ID and intended procedure confirmed with present staff. Received instructions for my participation in the procedure from the performing physician.  

## 2016-08-01 NOTE — Op Note (Signed)
[facility] Patient Name: Sean Wu Procedure Date: 08/01/2016 9:12 AM MRN: [id number] Endoscopist: Milus Banister , MD Age: 45 Referring MD:  Date of Birth: [birth year] Gender: Male Account #: [id number] Procedure:                Upper GI endoscopy Indications:              Dyspepsia, Dysphagia, Heartburn; EGD 03/2013 found                            H. Pylori Neg gastritis, + retained food in                            stomach.GES 04/2013 confirmed gastroparesis. 05/2013                            Reglan 5mg  tid started without much help. 10/14                            altered reglan dosing. Recent office visit                            recommended he restart reglan AC and changed PPI to                            twice daily. Medicines:                Monitored Anesthesia Care Procedure:                Pre-Anesthesia Assessment:                           - Prior to the procedure, a History and Physical                            was performed, and patient medications and                            allergies were reviewed. The patient's tolerance of                            previous anesthesia was also reviewed. The risks                            and benefits of the procedure and the sedation                            options and risks were discussed with the patient.                            All questions were answered, and informed consent                            was obtained. Prior Anticoagulants: The patient has  taken no previous anticoagulant or antiplatelet                            agents. ASA Grade Assessment: II - A patient with                            mild systemic disease. After reviewing the risks                            and benefits, the patient was deemed in                            satisfactory condition to undergo the procedure.                           After obtaining informed consent, the endoscope was                            passed under direct vision. Throughout the                            procedure, the patient's blood pressure, pulse, and                            oxygen saturations were monitored continuously. The                            Model GIF-[id number]) scope was introduced                            through the mouth, and advanced to the second part                            of duodenum. The upper GI endoscopy was                            accomplished without difficulty. The patient                            tolerated the procedure well. Scope In: Scope Out: Findings:                 LA Grade A (one or more mucosal breaks less than 5                            mm, not extending between tops of 2 mucosal folds)                            esophagitis was found in the distal esophagus.                           Mild inflammation characterized by erythema and  friability was found in the gastric antrum.                            Biopsies were taken with a cold forceps for                            histology.                           The exam was otherwise without abnormality. Complications:            No immediate complications. Estimated blood loss:                            None. Estimated Blood Loss:     Estimated blood loss: none. Impression:               - LA Grade A reflux esophagitis (mild)                           - Gastritis. Biopsied.                           - The examination was otherwise normal. Recommendation:           - Patient has a contact number available for                            emergencies. The signs and symptoms of potential                            delayed complications were discussed with the                            patient. Return to normal activities tomorrow.                            Written discharge instructions were provided to the                            patient.                            - Resume previous diet.                           - You should take the reglan 5mg  pills just prior                            to meals (three times daily). You should be take                            the protonix 40mg  pill 20-30 min prior to breakfast                            meal. Also please start ranitidine 150mg  pill                            (  OTC), take one pill at bedtime every night.                           - No repeat upper endoscopy. Milus Banister, MD 08/01/2016 9:26:05 AM This report has been signed electronically.

## 2016-08-01 NOTE — Progress Notes (Signed)
To recovery, report to [place], RN, VSS. 

## 2016-08-04 ENCOUNTER — Telehealth: Payer: Self-pay

## 2016-08-04 NOTE — Telephone Encounter (Signed)
No answer. Number identifier. Left voicemail we will attempt another follow up call later today.

## 2016-08-04 NOTE — Telephone Encounter (Signed)
  Follow up Call-  Call back number 08/01/2016  Post procedure Call Back phone  # [phone]  Permission to leave phone message Yes  Some recent data might be hidden    Patient was called for follow up after his procedure on 08/01/2016. No answer at the number given for follow up phone call. I was not able to leave a message because the mail box was full.

## 2016-08-05 ENCOUNTER — Encounter: Payer: 59 | Admitting: Gastroenterology

## 2016-08-11 ENCOUNTER — Encounter: Payer: Self-pay | Admitting: Gastroenterology

## 2016-09-03 ENCOUNTER — Ambulatory Visit: Payer: 59 | Admitting: Diagnostic Neuroimaging

## 2016-09-04 ENCOUNTER — Encounter: Payer: Self-pay | Admitting: Diagnostic Neuroimaging

## 2016-09-05 ENCOUNTER — Encounter: Payer: Self-pay | Admitting: Internal Medicine

## 2016-09-05 ENCOUNTER — Ambulatory Visit (INDEPENDENT_AMBULATORY_CARE_PROVIDER_SITE_OTHER): Payer: 59 | Admitting: Internal Medicine

## 2016-09-05 VITALS — BP 128/72 | HR 111 | Temp 98.0°F | Ht 68.0 in | Wt 203.0 lb

## 2016-09-05 DIAGNOSIS — F17200 Nicotine dependence, unspecified, uncomplicated: Secondary | ICD-10-CM

## 2016-09-05 DIAGNOSIS — F319 Bipolar disorder, unspecified: Secondary | ICD-10-CM

## 2016-09-05 DIAGNOSIS — F3130 Bipolar disorder, current episode depressed, mild or moderate severity, unspecified: Secondary | ICD-10-CM

## 2016-09-05 DIAGNOSIS — J301 Allergic rhinitis due to pollen: Secondary | ICD-10-CM

## 2016-09-05 MED ORDER — HYDROCODONE-ACETAMINOPHEN 5-325 MG PO TABS: 1.0000 | ORAL_TABLET | Freq: Four times a day (QID) | ORAL | 0 refills | Status: DC | PRN

## 2016-09-05 MED ORDER — HYDROCODONE-HOMATROPINE 5-1.5 MG/5ML PO SYRP: 5.0000 mL | ORAL_SOLUTION | Freq: Four times a day (QID) | ORAL | 0 refills | Status: DC | PRN

## 2016-09-05 NOTE — Progress Notes (Signed)
Subjective:    Patient ID: Sean Wu, male    DOB: 1971-08-13, 46 y.o.   MRN: [id number]  HPI  46 year old patient who has a history of allergic rhinitis.  He also has a history of bipolar depression and he has been prescribed naltrexone by psychiatry.  The patient states that he has not taken this medication. He presents with a three-day history of cough, congestion, rhinorrhea, and general sense of unwellness.  There is been no fever.  Past Medical History:  Diagnosis Date  . ALLERGIC RHINITIS 05/10/2009  . Allergy    anemia  . Anxiety   . Bipolar disorder ([place])    see medications patient to bring  . Chronic headaches   . DDD (degenerative disc disease)   . DEPRESSION 05/10/2009  . Gastroparesis   . GERD 05/10/2009  . LIBIDO, DECREASED 05/10/2009  . Neuromuscular disorder ([place])    pt. denies 08/01/16  . Osgood-Schlatter's disease   . SLEEP APNEA, OBSTRUCTIVE 06/12/2009   CPAP not utilized after 30 lb. weight loss  . SMOKER 06/12/2009  . TESTICULAR HYPOFUNCTION 06/12/2009  . WEIGHT GAIN 05/10/2009     Social History   Social History  . Marital status: Married    Spouse name: [name]  . Number of children: 0  . Years of education: 14   Occupational History  . Unemployeed    Social History Main Topics  . Smoking status: Current Every Day Smoker    Packs/day: 0.50    Years: 15.00    Types: Cigarettes  . Smokeless tobacco: Former Systems developer    Types: [place] date: 09/01/1998     Comment: tobacco info given 08/01/13  . Alcohol use 0.6 oz/week    1 Cans of beer per week     Comment: social, rare  . Drug use: No  . Sexual activity: Yes   Other Topics Concern  . Not on file   Social History Narrative   Lives with wife at home   Caffeine use- tea, 3 glasses daily    Past Surgical History:  Procedure Laterality Date  . ANKLE SURGERY Left   . COLONOSCOPY  2014   normal  . EYE SURGERY     eye implant  . FRACTURE SURGERY     surgery on right ankle for ligament  torn 2013  . LASIK Bilateral   . SHOULDER ARTHROSCOPY WITH DISTAL CLAVICLE RESECTION Right 02/26/2015   Procedure: SHOULDER DIAGNOSTIC ARTHROSCOPY WITH OPEN DISTAL CLAVICLE EXCISION;  Surgeon: Tania Ade, MD;  Location: [facility];  Service: Orthopedics;  Laterality: Right;  Right diagnostic arthroscopy, open distal clavical excision    Family History  Problem Relation Age of Onset  . Heart disease Maternal Grandfather   . Irritable bowel syndrome Father   . Alcoholism Father   . Heart attack Father   . Bipolar disorder Father   . Colon cancer Neg Hx   . Rectal cancer Neg Hx   . Stomach cancer Neg Hx     Allergies  Allergen Reactions  . Topamax [Topiramate]     Sleep walking, hallucinations, "I get mean, can't help it"    Current Outpatient Prescriptions on File Prior to Visit  Medication Sig Dispense Refill  . ALPRAZolam (XANAX) 1 MG tablet Take 1 tablet as needed    . Asenapine Maleate (SAPHRIS) 10 MG SUBL Place 20 mg under the tongue daily.    . cloNIDine (CATAPRES) 0.1 MG tablet TAKE 1 OR 2 TABLETS  BY MOUTH AT BEDTIME  12  . diphenhydrAMINE (BENADRYL) 25 mg capsule Take 50 mg by mouth every 6 (six) hours as needed.    . DULoxetine (CYMBALTA) 60 MG capsule Take 60 mg by mouth every morning.  3  . lamoTRIgine (LAMICTAL) 150 MG tablet Take 300 mg by mouth daily.     Marland Kitchen LINZESS 290 MCG CAPS capsule TAKE 1 CAPSULE (290 MCG TOTAL) BY MOUTH DAILY. 30 capsule 11  . naltrexone (DEPADE) 50 MG tablet Take 50 mg by mouth daily.  3  . omeprazole (PRILOSEC) 20 MG capsule TAKE 1 CAPSULE (20 MG TOTAL) BY MOUTH DAILY. 90 capsule 2  . pantoprazole (PROTONIX) 40 MG tablet Take 1 tablet (40 mg total) by mouth 2 (two) times daily. 60 tablet 6  . prochlorperazine (COMPAZINE) 10 MG tablet TAKE 1 TABLET (10 MG TOTAL) BY MOUTH EVERY 6 (SIX) HOURS AS NEEDED. 30 tablet 2  . QUEtiapine (SEROQUEL) 400 MG tablet     . sildenafil (VIAGRA) 100 MG tablet Take 1 tablet (100 mg total) by  mouth daily as needed for erectile dysfunction. 6 tablet 2  . traMADol (ULTRAM) 50 MG tablet TAKE 1 TABLET 3 TIMES A DAY AS NEEDED 30 tablet 5  . zolpidem (AMBIEN) 10 MG tablet     . [DISCONTINUED] omeprazole (PRILOSEC OTC) 20 MG tablet 1 tablet twice daily. Do not eat for one hour after ingestion of the medication 60 tablet 11   Current Facility-Administered Medications on File Prior to Visit  Medication Dose Route Frequency Provider Last Rate Last Dose  . 0.9 %  sodium chloride infusion  500 mL Intravenous Continuous Milus Banister, MD        BP 128/72 (BP Location: Right Arm, Patient Position: Sitting, Cuff Size: Normal)   Pulse (!) 111   Temp 98 F (36.7 C) (Oral)   Ht 5\' 8"  (1.727 m)   Wt 203 lb (92.1 kg)   SpO2 97%   BMI 30.87 kg/m     Review of Systems  Constitutional: Positive for activity change, appetite change and fatigue. Negative for chills and fever.  HENT: Positive for congestion, postnasal drip, rhinorrhea and sinus pressure. Negative for dental problem, ear pain, hearing loss, sore throat, tinnitus, trouble swallowing and voice change.   Eyes: Negative for pain, discharge and visual disturbance.  Respiratory: Positive for cough. Negative for chest tightness, wheezing and stridor.   Cardiovascular: Negative for chest pain, palpitations and leg swelling.  Gastrointestinal: Negative for abdominal distention, abdominal pain, blood in stool, constipation, diarrhea, nausea and vomiting.  Genitourinary: Negative for difficulty urinating, discharge, flank pain, genital sores, hematuria and urgency.  Musculoskeletal: Positive for myalgias. Negative for arthralgias, back pain, gait problem, joint swelling and neck stiffness.  Skin: Negative for rash.  Neurological: Positive for headaches. Negative for dizziness, syncope, speech difficulty, weakness and numbness.  Hematological: Negative for adenopathy. Does not bruise/bleed easily.  Psychiatric/Behavioral: Negative for  behavioral problems and dysphoric mood. The patient is not nervous/anxious.        Objective:   Physical Exam  Constitutional: He is oriented to person, place, and time. He appears well-developed.  Afebrile Appears unwell, but in no acute distress Pulse decrease 90  HENT:  Head: Normocephalic.  Right Ear: External ear normal.  Left Ear: External ear normal.  Mouth/Throat: Oropharynx is clear and moist.  Nasal congestion and some swollen nasal mucosa Oral pharynx only minimally injected  Eyes: Conjunctivae and EOM are normal.  Neck: Normal range of motion. Neck  supple.  Cardiovascular: Normal rate, regular rhythm and normal heart sounds.   Pulmonary/Chest: Breath sounds normal. No respiratory distress. He has no wheezes. He has no rales.  Abdominal: Bowel sounds are normal.  Musculoskeletal: Normal range of motion. He exhibits no edema or tenderness.  Lymphadenopathy:    He has no cervical adenopathy.  Neurological: He is alert and oriented to person, place, and time.  Psychiatric: He has a normal mood and affect. His behavior is normal.          Assessment & Plan:   URI/flu syndrome.  Will treat symptomatically.  Patient will force fluids.  Cough is his predominant complaint, along with rhinorrhea.  Will treat with the Hydromet.  Report any new or worsening symptoms  Nyoka Cowden

## 2016-09-05 NOTE — Patient Instructions (Signed)
Acute bronchitis symptoms for less than 10 days are generally not helped by antibiotics.  Take over-the-counter expectorants and cough medications such as  Mucinex DM.  Call if there is no improvement in 5 to 7 days or if  you develop worsening cough, fever, or new symptoms, such as shortness of breath or chest pain.   

## 2016-09-05 NOTE — Progress Notes (Signed)
Pre visit review using our clinic review tool, if applicable. No additional management support is needed unless otherwise documented below in the visit note. 

## 2016-10-17 ENCOUNTER — Telehealth: Payer: Self-pay | Admitting: Internal Medicine

## 2016-10-17 MED ORDER — HYDROCODONE-HOMATROPINE 5-1.5 MG/5ML PO SYRP: 5.0000 mL | ORAL_SOLUTION | Freq: Four times a day (QID) | ORAL | 0 refills | Status: DC | PRN

## 2016-10-17 NOTE — Telephone Encounter (Signed)
See message below, please advise.

## 2016-10-17 NOTE — Telephone Encounter (Signed)
° °  Pt saif he still has a cough and is asking for a refill of the below med   Pt request refill of the following:  HYDROcodone-homatropine (HYCODAN) 5-1.5 MG/5ML syrup     Phamacy:

## 2016-10-17 NOTE — Telephone Encounter (Signed)
Rx ready for pickup. Rx printed and signed. Attempted to call pt and notify... No answer on cell phone so a detail message was left on voicemail stating that Rx will be awaiting pt pick up at the front desk.

## 2016-10-17 NOTE — Telephone Encounter (Signed)
Okay 6 ounces 

## 2016-10-30 ENCOUNTER — Ambulatory Visit: Payer: 59 | Admitting: Internal Medicine

## 2016-10-31 ENCOUNTER — Ambulatory Visit (INDEPENDENT_AMBULATORY_CARE_PROVIDER_SITE_OTHER): Payer: 59 | Admitting: Internal Medicine

## 2016-10-31 ENCOUNTER — Encounter: Payer: Self-pay | Admitting: Internal Medicine

## 2016-10-31 VITALS — BP 148/72 | HR 100 | Temp 98.1°F | Ht 68.0 in | Wt 205.8 lb

## 2016-10-31 DIAGNOSIS — M5416 Radiculopathy, lumbar region: Secondary | ICD-10-CM | POA: Diagnosis not present

## 2016-10-31 DIAGNOSIS — M549 Dorsalgia, unspecified: Secondary | ICD-10-CM | POA: Diagnosis not present

## 2016-10-31 DIAGNOSIS — G8929 Other chronic pain: Secondary | ICD-10-CM

## 2016-10-31 DIAGNOSIS — K5909 Other constipation: Secondary | ICD-10-CM | POA: Diagnosis not present

## 2016-10-31 MED ORDER — METRONIDAZOLE 0.75 % EX CREA: TOPICAL_CREAM | Freq: Two times a day (BID) | CUTANEOUS | 0 refills | Status: DC

## 2016-10-31 NOTE — Patient Instructions (Addendum)
Follow these instructions at home: Skin Care  Take care of your skin as told by your doctor. Your doctor may tell you do these things:  Wash your skin gently two or more times each day.  Use mild soap.  Use a sunscreen or sunblock with SPF 30 or greater.    Shave with an electric shaver instead of a blade. Lifestyle   Try to keep track of what foods trigger this condition. Avoid any triggers. These may include: ? Spicy foods. ? Seafood. ? Cheese. ? Hot liquids. ? Nuts. ? Chocolate. ? Iodized salt.  Do not drink alcohol.  Avoid extremely cold or hot temperatures.  Try to reduce your stress. If you need help to do this, talk with your doctor.  When you exercise, do these things to stay cool: ? Limit your sun exposure. ? Use a fan. ? Exercise for a shorter time, and exercise more often.

## 2016-10-31 NOTE — Progress Notes (Signed)
Subjective:    Patient ID: Sean Wu, male    DOB: 1971-08-06, 46 y.o.   MRN: [id number]  HPI  46 year old patient who is seen complaining of a dermatitis primarily over the chin region.  He also describes some eruptions over the anterior chest.  He has been self treating with Clearasil without much benefit.  He has a history of chronic low back pain as well as chronic constipation issues.  He also describes chronic shoulder discomfort.  He has bipolar disorder with anxiety disorder and ongoing tobacco use. He states that he is been able to work for 5 years due to his chronic back and shoulder pain as well as his GI issues  He is applying for disability benefits and also is asking for disability form completion  Past Medical History:  Diagnosis Date  . ALLERGIC RHINITIS 05/10/2009  . Allergy    anemia  . Anxiety   . Bipolar disorder ([place])    see medications patient to bring  . Chronic headaches   . DDD (degenerative disc disease)   . DEPRESSION 05/10/2009  . Gastroparesis   . GERD 05/10/2009  . LIBIDO, DECREASED 05/10/2009  . Neuromuscular disorder ([place])    pt. denies 08/01/16  . Osgood-Schlatter's disease   . SLEEP APNEA, OBSTRUCTIVE 06/12/2009   CPAP not utilized after 30 lb. weight loss  . SMOKER 06/12/2009  . TESTICULAR HYPOFUNCTION 06/12/2009  . WEIGHT GAIN 05/10/2009     Social History   Social History  . Marital status: Married    Spouse name: [name]  . Number of children: 0  . Years of education: 14   Occupational History  . Unemployeed    Social History Main Topics  . Smoking status: Current Every Day Smoker    Packs/day: 0.50    Years: 15.00    Types: Cigarettes  . Smokeless tobacco: Former Systems developer    Types: [place] date: 09/01/1998     Comment: tobacco info given 08/01/13  . Alcohol use 0.6 oz/week    1 Cans of beer per week     Comment: social, rare  . Drug use: No  . Sexual activity: Yes   Other Topics Concern  . Not on file   Social History  Narrative   Lives with wife at home   Caffeine use- tea, 3 glasses daily    Past Surgical History:  Procedure Laterality Date  . ANKLE SURGERY Left   . COLONOSCOPY  2014   normal  . EYE SURGERY     eye implant  . FRACTURE SURGERY     surgery on right ankle for ligament torn 2013  . LASIK Bilateral   . SHOULDER ARTHROSCOPY WITH DISTAL CLAVICLE RESECTION Right 02/26/2015   Procedure: SHOULDER DIAGNOSTIC ARTHROSCOPY WITH OPEN DISTAL CLAVICLE EXCISION;  Surgeon: Tania Ade, MD;  Location: [place];  Service: Orthopedics;  Laterality: Right;  Right diagnostic arthroscopy, open distal clavical excision    Family History  Problem Relation Age of Onset  . Heart disease Maternal Grandfather   . Irritable bowel syndrome Father   . Alcoholism Father   . Heart attack Father   . Bipolar disorder Father   . Colon cancer Neg Hx   . Rectal cancer Neg Hx   . Stomach cancer Neg Hx     Allergies  Allergen Reactions  . Topamax [Topiramate]     Sleep walking, hallucinations, "I get mean, can't help it"    Current Outpatient Prescriptions  on File Prior to Visit  Medication Sig Dispense Refill  . ALPRAZolam (XANAX) 1 MG tablet Take 1 tablet as needed    . cloNIDine (CATAPRES) 0.1 MG tablet TAKE 1 OR 2 TABLETS BY MOUTH AT BEDTIME  12  . lamoTRIgine (LAMICTAL) 150 MG tablet Take 300 mg by mouth daily.     Marland Kitchen LINZESS 290 MCG CAPS capsule TAKE 1 CAPSULE (290 MCG TOTAL) BY MOUTH DAILY. 30 capsule 11  . omeprazole (PRILOSEC) 20 MG capsule TAKE 1 CAPSULE (20 MG TOTAL) BY MOUTH DAILY. 90 capsule 2  . prochlorperazine (COMPAZINE) 10 MG tablet TAKE 1 TABLET (10 MG TOTAL) BY MOUTH EVERY 6 (SIX) HOURS AS NEEDED. 30 tablet 2  . QUEtiapine (SEROQUEL) 400 MG tablet     . sildenafil (VIAGRA) 100 MG tablet Take 1 tablet (100 mg total) by mouth daily as needed for erectile dysfunction. 6 tablet 2  . traMADol (ULTRAM) 50 MG tablet TAKE 1 TABLET 3 TIMES A DAY AS NEEDED 30 tablet 5  .  [DISCONTINUED] omeprazole (PRILOSEC OTC) 20 MG tablet 1 tablet twice daily. Do not eat for one hour after ingestion of the medication 60 tablet 11   No current facility-administered medications on file prior to visit.     BP (!) 148/72 (BP Location: Left Arm, Patient Position: Sitting, Cuff Size: Normal)   Pulse 100   Temp 98.1 F (36.7 C) (Oral)   Ht 5\' 8"  (1.727 m)   Wt 205 lb 12.8 oz (93.4 kg)   SpO2 98%   BMI 31.29 kg/m     Review of Systems  Constitutional: Positive for activity change.  Musculoskeletal: Positive for arthralgias and back pain.  Skin: Positive for rash.  Psychiatric/Behavioral: The patient is nervous/anxious.        Objective:   Physical Exam  Constitutional: He appears well-developed and well-nourished. No distress.  Skin:  Scattered scaly papules 2-3 mm in diameter, primarily over the chin area.  He had a few scattered similar lesions over the anterior chest          Assessment & Plan:   Acneform type eruptions.  Possible rosacea.  Will treat with topical metronidazole Bipolar disorder Chronic low back pain Chronic constipation Bilateral shoulder pain  Disability forms completed  Nyoka Cowden

## 2016-10-31 NOTE — Progress Notes (Signed)
Pre visit review using our clinic review tool, if applicable. No additional management support is needed unless otherwise documented below in the visit note. 

## 2016-11-15 ENCOUNTER — Other Ambulatory Visit: Payer: Self-pay | Admitting: Internal Medicine

## 2016-11-21 ENCOUNTER — Encounter: Payer: Self-pay | Admitting: Adult Health

## 2016-11-21 ENCOUNTER — Ambulatory Visit (INDEPENDENT_AMBULATORY_CARE_PROVIDER_SITE_OTHER): Payer: 59 | Admitting: Adult Health

## 2016-11-21 VITALS — BP 126/74 | Temp 97.8°F | Ht 68.0 in | Wt 201.6 lb

## 2016-11-21 DIAGNOSIS — J069 Acute upper respiratory infection, unspecified: Secondary | ICD-10-CM

## 2016-11-21 MED ORDER — AZITHROMYCIN 250 MG PO TABS: ORAL_TABLET | ORAL | 0 refills | Status: DC

## 2016-11-21 MED ORDER — PREDNISONE 10 MG PO TABS: ORAL_TABLET | ORAL | 0 refills | Status: DC

## 2016-11-21 MED ORDER — HYDROCODONE-HOMATROPINE 5-1.5 MG/5ML PO SYRP: 5.0000 mL | ORAL_SOLUTION | Freq: Three times a day (TID) | ORAL | 0 refills | Status: DC | PRN

## 2016-11-21 NOTE — Progress Notes (Signed)
Subjective:    Patient ID: Sean Wu, male    DOB: 08/14/1971, 46 y.o.   MRN: [id number]  HPI  46 year old male who  has a past medical history of ALLERGIC RHINITIS (05/10/2009); Allergy; Anxiety; Bipolar disorder ([place]); Chronic headaches; DDD (degenerative disc disease); DEPRESSION (05/10/2009); Gastroparesis; GERD (05/10/2009); LIBIDO, DECREASED (05/10/2009); Neuromuscular disorder ([place]); Osgood-Schlatter's disease; SLEEP APNEA, OBSTRUCTIVE (06/12/2009); SMOKER (06/12/2009); TESTICULAR HYPOFUNCTION (06/12/2009); and WEIGHT GAIN (05/10/2009).  He is a patient of Dr. Raliegh Ip who I am seeing today for the first time due to an acute issue of cough. He reports that he has had a semi productive cough " for a few months". His PCP had prescribed Hycodan cough syrup in February which he endorsed help. First seen for this on 09/05/2016. He has had two refills of Hycodan since January   He reports that he continues to smoke but is down to 3 cigarettes per day   Review of Systems  Constitutional: Negative.   HENT: Positive for rhinorrhea.   Eyes: Negative.   Respiratory: Positive for cough. Negative for chest tightness, shortness of breath and wheezing.   Cardiovascular: Negative.   Neurological: Negative.   All other systems reviewed and are negative.  Past Medical History:  Diagnosis Date  . ALLERGIC RHINITIS 05/10/2009  . Allergy    anemia  . Anxiety   . Bipolar disorder ([place])    see medications patient to bring  . Chronic headaches   . DDD (degenerative disc disease)   . DEPRESSION 05/10/2009  . Gastroparesis   . GERD 05/10/2009  . LIBIDO, DECREASED 05/10/2009  . Neuromuscular disorder ([place])    pt. denies 08/01/16  . Osgood-Schlatter's disease   . SLEEP APNEA, OBSTRUCTIVE 06/12/2009   CPAP not utilized after 30 lb. weight loss  . SMOKER 06/12/2009  . TESTICULAR HYPOFUNCTION 06/12/2009  . WEIGHT GAIN 05/10/2009    Social History   Social History  . Marital status: Married    Spouse name: [name]    . Number of children: 0  . Years of education: 14   Occupational History  . Unemployeed    Social History Main Topics  . Smoking status: Current Every Day Smoker    Packs/day: 0.50    Years: 15.00    Types: Cigarettes  . Smokeless tobacco: Former Systems developer    Types: [place] date: 09/01/1998     Comment: tobacco info given 08/01/13  . Alcohol use 0.6 oz/week    1 Cans of beer per week     Comment: social, rare  . Drug use: No  . Sexual activity: Yes   Other Topics Concern  . Not on file   Social History Narrative   Lives with wife at home   Caffeine use- tea, 3 glasses daily    Past Surgical History:  Procedure Laterality Date  . ANKLE SURGERY Left   . COLONOSCOPY  2014   normal  . EYE SURGERY     eye implant  . FRACTURE SURGERY     surgery on right ankle for ligament torn 2013  . LASIK Bilateral   . SHOULDER ARTHROSCOPY WITH DISTAL CLAVICLE RESECTION Right 02/26/2015   Procedure: SHOULDER DIAGNOSTIC ARTHROSCOPY WITH OPEN DISTAL CLAVICLE EXCISION;  Surgeon: Tania Ade, MD;  Location: [place];  Service: Orthopedics;  Laterality: Right;  Right diagnostic arthroscopy, open distal clavical excision    Family History  Problem Relation Age of Onset  . Heart disease Maternal Grandfather   .  Irritable bowel syndrome Father   . Alcoholism Father   . Heart attack Father   . Bipolar disorder Father   . Colon cancer Neg Hx   . Rectal cancer Neg Hx   . Stomach cancer Neg Hx     Allergies  Allergen Reactions  . Topamax [Topiramate]     Sleep walking, hallucinations, "I get mean, can't help it"    Current Outpatient Prescriptions on File Prior to Visit  Medication Sig Dispense Refill  . ALPRAZolam (XANAX) 1 MG tablet Take 1 tablet as needed    . cloNIDine (CATAPRES) 0.1 MG tablet TAKE 1 OR 2 TABLETS BY MOUTH AT BEDTIME  12  . lamoTRIgine (LAMICTAL) 150 MG tablet Take 300 mg by mouth daily.     Marland Kitchen LINZESS 290 MCG CAPS capsule TAKE 1 CAPSULE (290  MCG TOTAL) BY MOUTH DAILY. 30 capsule 11  . metroNIDAZOLE (METROCREAM) 0.75 % cream Apply topically 2 (two) times daily. 45 g 0  . omeprazole (PRILOSEC) 20 MG capsule TAKE 1 CAPSULE (20 MG TOTAL) BY MOUTH DAILY. 90 capsule 2  . prochlorperazine (COMPAZINE) 10 MG tablet TAKE 1 TABLET (10 MG TOTAL) BY MOUTH EVERY 6 (SIX) HOURS AS NEEDED. 30 tablet 2  . QUEtiapine (SEROQUEL) 400 MG tablet     . sildenafil (VIAGRA) 100 MG tablet Take 1 tablet (100 mg total) by mouth daily as needed for erectile dysfunction. 6 tablet 2  . traMADol (ULTRAM) 50 MG tablet TAKE 1 TABLET 3 TIMES A DAY AS NEEDED 30 tablet 0  . [DISCONTINUED] omeprazole (PRILOSEC OTC) 20 MG tablet 1 tablet twice daily. Do not eat for one hour after ingestion of the medication 60 tablet 11   No current facility-administered medications on file prior to visit.     BP 126/74 (BP Location: Left Arm, Patient Position: Sitting, Cuff Size: Normal)   Temp 97.8 F (36.6 C) (Oral)   Ht 5\' 8"  (1.727 m)   Wt 201 lb 9.6 oz (91.4 kg)   BMI 30.65 kg/m       Objective:   Physical Exam  Constitutional: He is oriented to person, place, and time. He appears well-developed and well-nourished. No distress.  HENT:  Head: Normocephalic and atraumatic.  Right Ear: External ear normal.  Left Ear: External ear normal.  Nose: Rhinorrhea present. No mucosal edema.  Mouth/Throat: Uvula is midline, oropharynx is clear and moist and mucous membranes are normal. No oropharyngeal exudate.  Eyes: Conjunctivae and EOM are normal. Pupils are equal, round, and reactive to light. Right eye exhibits no discharge. Left eye exhibits no discharge. No scleral icterus.  Neck: Normal range of motion. Neck supple. No thyromegaly present.  Cardiovascular: Normal rate, regular rhythm, normal heart sounds and intact distal pulses.  Exam reveals no gallop and no friction rub.   No murmur heard. Pulmonary/Chest: Effort normal and breath sounds normal. No respiratory distress.  He has no wheezes. He has no rales. He exhibits no tenderness.  Musculoskeletal: Normal range of motion. He exhibits no edema or tenderness.  Lymphadenopathy:       Head (right side): Submandibular and tonsillar adenopathy present.       Head (left side): Submandibular and tonsillar adenopathy present.  Neurological: He is alert and oriented to person, place, and time.  Skin: Skin is warm and dry. No rash noted. He is not diaphoretic. No erythema. No pallor.  Psychiatric: He has a normal mood and affect. His behavior is normal. Judgment and thought content normal.  Nursing note  and vitals reviewed.     Assessment & Plan:  1. Upper respiratory tract infection, unspecified type - Will treat with prednisone and Z pack due to being a smoker and for length of time he has had his symptoms.  - HYDROcodone-homatropine (HYCODAN) 5-1.5 MG/5ML syrup; Take 5 mLs by mouth every 8 (eight) hours as needed for cough.  Dispense: 120 mL; Refill: 0 - predniSONE (DELTASONE) 10 MG tablet; 40 mg x 3 days, 20 mg x 3 days, 10 mg x 3 days  Dispense: 21 tablet; Refill: 0 - azithromycin (ZITHROMAX Z-PAK) 250 MG tablet; Take 2 tablets on Day 1.  Then take 1 tablet daily.  Dispense: 6 tablet; Refill: 0 - Follow up with PCP if no resolution   Dorothyann Peng, NP

## 2016-11-27 ENCOUNTER — Other Ambulatory Visit: Payer: Self-pay | Admitting: Internal Medicine

## 2016-12-10 ENCOUNTER — Telehealth: Payer: Self-pay | Admitting: Gastroenterology

## 2016-12-10 MED ORDER — LINACLOTIDE 290 MCG PO CAPS: 290.0000 ug | ORAL_CAPSULE | Freq: Every day | ORAL | 3 refills | Status: DC

## 2016-12-10 NOTE — Telephone Encounter (Signed)
Medication filled for 1 year.

## 2016-12-15 ENCOUNTER — Ambulatory Visit (INDEPENDENT_AMBULATORY_CARE_PROVIDER_SITE_OTHER): Payer: 59 | Admitting: Family Medicine

## 2016-12-15 ENCOUNTER — Encounter: Payer: Self-pay | Admitting: Family Medicine

## 2016-12-15 VITALS — BP 110/88 | HR 90 | Temp 98.4°F | Wt 204.6 lb

## 2016-12-15 DIAGNOSIS — L03012 Cellulitis of left finger: Secondary | ICD-10-CM | POA: Diagnosis not present

## 2016-12-15 DIAGNOSIS — J309 Allergic rhinitis, unspecified: Secondary | ICD-10-CM

## 2016-12-15 DIAGNOSIS — H6121 Impacted cerumen, right ear: Secondary | ICD-10-CM

## 2016-12-15 MED ORDER — FLUTICASONE PROPIONATE 50 MCG/ACT NA SUSP: 2.0000 | Freq: Every day | NASAL | 6 refills | Status: DC

## 2016-12-15 MED ORDER — CEPHALEXIN 500 MG PO CAPS: 500.0000 mg | ORAL_CAPSULE | Freq: Four times a day (QID) | ORAL | 0 refills | Status: DC

## 2016-12-15 NOTE — Patient Instructions (Addendum)
It was a pleasure to see you today. Please follow up if symptoms do not improve with treatment, worsen, or you develop a fever >100 or increased redness or swelling of your finger. Please consider Allegra, Claritin, or Zyrtec for your allergy symptoms.   Cellulitis, Adult Cellulitis is a skin infection. The infected area is usually red and sore. This condition occurs most often in the arms and lower legs. It is very important to get treated for this condition. Follow these instructions at home:  Take over-the-counter and prescription medicines only as told by your doctor.  If you were prescribed an antibiotic medicine, take it as told by your doctor. Do not stop taking the antibiotic even if you start to feel better.  Drink enough fluid to keep your pee (urine) clear or pale yellow.  Do not touch or rub the infected area.  Raise (elevate) the infected area above the level of your heart while you are sitting or lying down.  Place warm or cold wet cloths (warm or cold compresses) on the infected area. Do this as told by your doctor.  Keep all follow-up visits as told by your doctor. This is important. These visits let your doctor make sure your infection is not getting worse. Contact a doctor if:  You have a fever.  Your symptoms do not get better after 1-2 days of treatment.  Your bone or joint under the infected area starts to hurt after the skin has healed.  Your infection comes back. This can happen in the same area or another area.  You have a swollen bump in the infected area.  You have new symptoms.  You feel ill and also have muscle aches and pains. Get help right away if:  Your symptoms get worse.  You feel very sleepy.  You throw up (vomit) or have watery poop (diarrhea) for a long time.  There are red streaks coming from the infected area.  Your red area gets larger.  Your red area turns darker. This information is not intended to replace advice given to you  by your health care provider. Make sure you discuss any questions you have with your health care provider. Document Released: 02/04/2008 Document Revised: 01/24/2016 Document Reviewed: 06/27/2015 Elsevier Interactive Patient Education  2017 [employer] NOW OFFER   [facility] FAST TRACK!!!  SAME DAY Appointments for ACUTE CARE  Such as: Sprains, Injuries, cuts, abrasions, rashes, muscle pain, joint pain, back pain Colds, flu, sore throats, headache, allergies, cough, fever  Ear pain, sinus and eye infections Abdominal pain, nausea, vomiting, diarrhea, upset stomach Animal/insect bites  3 Easy Ways to Schedule: Walk-In Scheduling Call in scheduling Mychart Sign-up: [email or web address]

## 2016-12-15 NOTE — Progress Notes (Addendum)
Subjective:    Patient ID: Sean Wu, male    DOB: [birth year], 46 y.o.   MRN: [id number]  HPI  Sean Wu is a 46 year old male who is here today due to report of right ear being "stopped up".  Associated rhinitis with clear drainage, nasal congestion, post nasal drip, sneezing, and sinus pressure/pain.  He denies fever, chills, sweats, SOB, N/V/D,  Ear pain, tooth pain, or myalgias.  History of allergic rhinitis. Treatment with benedryl has provided limited benefit.  He reports history of influenza 6 months ago. No recent sick contact exposure or antibiotic use. He is a current smoker.  He reports that his left 4th digit has erythema and edema that has been present for 2 days ago. Trigger of biting nails may have been a trigger for this event. He reported that this has improved as he has opened the area with the needle and expelled a small amount of yellow discharge. Following this event, he reports that area feels much better and he is not experiencing pain. Associated erythema and redness have been present. No trauma to nail has been noted.  No other treatments have been noted.  Review of Systems  Constitutional: Negative for chills, fatigue and fever.  HENT: Positive for congestion, postnasal drip, rhinorrhea, sinus pain, sinus pressure and sneezing. Negative for sore throat.   Respiratory: Negative for cough, shortness of breath and wheezing.   Cardiovascular: Negative for chest pain and palpitations.  Gastrointestinal: Negative for abdominal pain, diarrhea, nausea and vomiting.  Musculoskeletal: Negative for myalgias.  Skin: Negative for rash.       Redness and erythema surrounding cuticle of left fourth digit  Neurological: Negative for dizziness and headaches.   Past Medical History:  Diagnosis Date  . ALLERGIC RHINITIS 05/10/2009  . Allergy    anemia  . Anxiety   . Bipolar disorder ([place])    see medications patient to bring  . Chronic headaches   . DDD (degenerative disc  disease)   . DEPRESSION 05/10/2009  . Gastroparesis   . GERD 05/10/2009  . LIBIDO, DECREASED 05/10/2009  . Neuromuscular disorder ([place])    pt. denies 08/01/16  . Osgood-Schlatter's disease   . SLEEP APNEA, OBSTRUCTIVE 06/12/2009   CPAP not utilized after 30 lb. weight loss  . SMOKER 06/12/2009  . TESTICULAR HYPOFUNCTION 06/12/2009  . WEIGHT GAIN 05/10/2009     Social History   Social History  . Marital status: Married    Spouse name: [name]  . Number of children: 0  . Years of education: 14   Occupational History  . Unemployeed    Social History Main Topics  . Smoking status: Current Every Day Smoker    Packs/day: 0.50    Years: 15.00    Types: Cigarettes  . Smokeless tobacco: Former Systems developer    Types: [place] date: 09/01/1998     Comment: tobacco info given 08/01/13  . Alcohol use 0.6 oz/week    1 Cans of beer per week     Comment: social, rare  . Drug use: No  . Sexual activity: Yes   Other Topics Concern  . Not on file   Social History Narrative   Lives with wife at home   Caffeine use- tea, 3 glasses daily    Past Surgical History:  Procedure Laterality Date  . ANKLE SURGERY Left   . COLONOSCOPY  2014   normal  . EYE SURGERY     eye implant  .  FRACTURE SURGERY     surgery on right ankle for ligament torn 2013  . LASIK Bilateral   . SHOULDER ARTHROSCOPY WITH DISTAL CLAVICLE RESECTION Right 02/26/2015   Procedure: SHOULDER DIAGNOSTIC ARTHROSCOPY WITH OPEN DISTAL CLAVICLE EXCISION;  Surgeon: Tania Ade, MD;  Location: [place];  Service: Orthopedics;  Laterality: Right;  Right diagnostic arthroscopy, open distal clavical excision    Family History  Problem Relation Age of Onset  . Heart disease Maternal Grandfather   . Irritable bowel syndrome Father   . Alcoholism Father   . Heart attack Father   . Bipolar disorder Father   . Colon cancer Neg Hx   . Rectal cancer Neg Hx   . Stomach cancer Neg Hx     Allergies  Allergen Reactions   . Topamax [Topiramate]     Sleep walking, hallucinations, "I get mean, can't help it"    Current Outpatient Prescriptions on File Prior to Visit  Medication Sig Dispense Refill  . ALPRAZolam (XANAX) 1 MG tablet Take 1 tablet as needed    . cloNIDine (CATAPRES) 0.1 MG tablet TAKE 1 OR 2 TABLETS BY MOUTH AT BEDTIME  12  . lamoTRIgine (LAMICTAL) 150 MG tablet Take 300 mg by mouth daily.     Marland Kitchen linaclotide (LINZESS) 290 MCG CAPS capsule Take 1 capsule (290 mcg total) by mouth daily. 90 capsule 3  . metroNIDAZOLE (METROCREAM) 0.75 % cream Apply topically 2 (two) times daily. 45 g 0  . omeprazole (PRILOSEC) 20 MG capsule TAKE 1 CAPSULE (20 MG TOTAL) BY MOUTH DAILY. 90 capsule 2  . prochlorperazine (COMPAZINE) 10 MG tablet TAKE 1 TABLET (10 MG TOTAL) BY MOUTH EVERY 6 (SIX) HOURS AS NEEDED. 30 tablet 2  . QUEtiapine (SEROQUEL) 400 MG tablet     . sildenafil (VIAGRA) 100 MG tablet Take 1 tablet (100 mg total) by mouth daily as needed for erectile dysfunction. 6 tablet 2  . traMADol (ULTRAM) 50 MG tablet TAKE 1 TABLET 3 TIMES A DAY AS NEEDED 30 tablet 0  . [DISCONTINUED] omeprazole (PRILOSEC OTC) 20 MG tablet 1 tablet twice daily. Do not eat for one hour after ingestion of the medication 60 tablet 11   No current facility-administered medications on file prior to visit.     BP 110/88 (BP Location: Left Arm, Patient Position: Sitting, Cuff Size: Normal)   Pulse 90   Temp 98.4 F (36.9 C) (Oral)   Wt 204 lb 9.6 oz (92.8 kg)   SpO2 96%   BMI 31.11 kg/m       Objective:   Physical Exam  Constitutional: He is oriented to person, place, and time. He appears well-developed and well-nourished.  HENT:  Right Ear: Tympanic membrane normal.  Left Ear: Tympanic membrane normal.  Nose: Rhinorrhea present. Right sinus exhibits maxillary sinus tenderness and frontal sinus tenderness. Left sinus exhibits maxillary sinus tenderness and frontal sinus tenderness.  Mouth/Throat: Mucous membranes are  normal. No oropharyngeal exudate or posterior oropharyngeal erythema.  Cerumen impaction in right ear; irrigation provided by CMA; TM normal  Eyes: Pupils are equal, round, and reactive to light. No scleral icterus.  Neck: Neck supple.  Cardiovascular: Normal rate and regular rhythm.   Pulmonary/Chest: Effort normal and breath sounds normal. He has no wheezes. He has no rales.  Abdominal: Soft. Bowel sounds are normal. There is no tenderness.  Lymphadenopathy:    He has no cervical adenopathy.  Neurological: He is alert and oriented to person, place, and time.  Skin: Skin  is warm and dry.  Mild erythema and edema noted at cuticle of left index finger. Small area of eschar noted at base of cuticle. No drainage, warmth, or fluctuance present Capillary refill <2 seconds.       Assessment & Plan:  1. Cellulitis of finger of left hand Improving per patient report. With patient opening up area with a needle that was not sterile and redness and erythema present will treat with cephalexin and advised follow up if symptoms do not improve, worsen, fever develops, or increased erythema and swelling present; - cephALEXin (KEFLEX) 500 MG capsule; Take 1 capsule (500 mg total) by mouth 4 (four) times daily.  Dispense: 20 capsule; Refill: 0  2. Allergic rhinitis, unspecified seasonality, unspecified trigger Advised avoidance of benedryl and initiate either Allegra, Claritin, or Zyrtec for symptoms. Addition of flonase for symptoms. - fluticasone (FLONASE) 50 MCG/ACT nasal spray; Place 2 sprays into both nostrils daily.  Dispense: 16 g; Refill: 6  3. Impacted cerumen of right ear Resolved; Irrigation provided by CMA, TM normal - Ear Lavage   Return precautions advised. Delano Metz, FNP-C

## 2016-12-15 NOTE — Progress Notes (Signed)
Pre visit review using our clinic review tool, if applicable. No additional management support is needed unless otherwise documented below in the visit note. 

## 2016-12-18 ENCOUNTER — Other Ambulatory Visit: Payer: Self-pay | Admitting: Internal Medicine

## 2016-12-23 ENCOUNTER — Other Ambulatory Visit: Payer: Self-pay | Admitting: Internal Medicine

## 2016-12-30 ENCOUNTER — Encounter: Payer: Self-pay | Admitting: Internal Medicine

## 2016-12-30 ENCOUNTER — Ambulatory Visit (INDEPENDENT_AMBULATORY_CARE_PROVIDER_SITE_OTHER): Payer: 59 | Admitting: Internal Medicine

## 2016-12-30 VITALS — BP 138/72 | HR 82 | Temp 98.1°F | Ht 68.0 in | Wt 202.3 lb

## 2016-12-30 DIAGNOSIS — F319 Bipolar disorder, unspecified: Secondary | ICD-10-CM

## 2016-12-30 DIAGNOSIS — F3130 Bipolar disorder, current episode depressed, mild or moderate severity, unspecified: Secondary | ICD-10-CM

## 2016-12-30 DIAGNOSIS — J301 Allergic rhinitis due to pollen: Secondary | ICD-10-CM

## 2016-12-30 DIAGNOSIS — E349 Endocrine disorder, unspecified: Secondary | ICD-10-CM | POA: Diagnosis not present

## 2016-12-30 NOTE — Progress Notes (Signed)
Pre visit review using our clinic review tool, if applicable. No additional management support is needed unless otherwise documented below in the visit note. 

## 2016-12-30 NOTE — Patient Instructions (Addendum)
It is important that you exercise regularly, at least 20 minutes 3 to 4 times per week.  If you develop chest pain or shortness of breath seek  medical attention.  Follow these instructions at home: Medicines   Take, use, or apply over-the-counter and prescription medicines only as told by your health care provider. These may include nasal sprays.   Hydrate and Humidify   Drink enough water to keep your urine clear or pale yellow. Staying hydrated will help to thin your mucus.  Use a cool mist humidifier to keep the humidity level in your home above 50%.  Inhale steam for 10-15 minutes, 3-4 times a day or as told by your health care provider. You can do this in the bathroom while a hot shower is running.  Limit your exposure to cool or dry air. Rest   Rest as much as possible.

## 2016-12-30 NOTE — Progress Notes (Signed)
Subjective:    Patient ID: Sean Wu, male    DOB: May 27, 1971, 46 y.o.   MRN: [id number]  HPI  46 year old patient who is seen today with prostate concerns. He has been followed [facility] and has been on subcutaneous hormone replacement therapy.  His recent PSA was 3.0.  Apparently this was increased compared to prior levels.  A serum testosterone was 840.  Hemoglobin has increased from 15-17 He generally feels unwell and has significant allergy related symptoms  Past Medical History:  Diagnosis Date  . ALLERGIC RHINITIS 05/10/2009  . Allergy    anemia  . Anxiety   . Bipolar disorder ([place])    see medications patient to bring  . Chronic headaches   . DDD (degenerative disc disease)   . DEPRESSION 05/10/2009  . Gastroparesis   . GERD 05/10/2009  . LIBIDO, DECREASED 05/10/2009  . Neuromuscular disorder ([place])    pt. denies 08/01/16  . Osgood-Schlatter's disease   . SLEEP APNEA, OBSTRUCTIVE 06/12/2009   CPAP not utilized after 30 lb. weight loss  . SMOKER 06/12/2009  . TESTICULAR HYPOFUNCTION 06/12/2009  . WEIGHT GAIN 05/10/2009     Social History   Social History  . Marital status: Married    Spouse name: [name]  . Number of children: 0  . Years of education: 14   Occupational History  . Unemployeed    Social History Main Topics  . Smoking status: Current Every Day Smoker    Packs/day: 0.50    Years: 15.00    Types: Cigarettes  . Smokeless tobacco: Former Systems developer    Types: [place] date: 09/01/1998     Comment: tobacco info given 08/01/13  . Alcohol use 0.6 oz/week    1 Cans of beer per week     Comment: social, rare  . Drug use: No  . Sexual activity: Yes   Other Topics Concern  . Not on file   Social History Narrative   Lives with wife at home   Caffeine use- tea, 3 glasses daily    Past Surgical History:  Procedure Laterality Date  . ANKLE SURGERY Left   . COLONOSCOPY  2014   normal  . EYE SURGERY     eye implant  . FRACTURE SURGERY     surgery on right ankle for ligament torn 2013  . LASIK Bilateral   . SHOULDER ARTHROSCOPY WITH DISTAL CLAVICLE RESECTION Right 02/26/2015   Procedure: SHOULDER DIAGNOSTIC ARTHROSCOPY WITH OPEN DISTAL CLAVICLE EXCISION;  Surgeon: Tania Ade, MD;  Location: [facility];  Service: Orthopedics;  Laterality: Right;  Right diagnostic arthroscopy, open distal clavical excision    Family History  Problem Relation Age of Onset  . Heart disease Maternal Grandfather   . Irritable bowel syndrome Father   . Alcoholism Father   . Heart attack Father   . Bipolar disorder Father   . Colon cancer Neg Hx   . Rectal cancer Neg Hx   . Stomach cancer Neg Hx     Allergies  Allergen Reactions  . Topamax [Topiramate]     Sleep walking, hallucinations, "I get mean, can't help it"    Current Outpatient Prescriptions on File Prior to Visit  Medication Sig Dispense Refill  . ALPRAZolam (XANAX) 1 MG tablet Take 1 tablet as needed    . cloNIDine (CATAPRES) 0.1 MG tablet TAKE 1 OR 2 TABLETS BY MOUTH AT BEDTIME  12  . fluticasone (FLONASE) 50 MCG/ACT nasal spray  Place 2 sprays into both nostrils daily. 16 g 6  . lamoTRIgine (LAMICTAL) 150 MG tablet Take 300 mg by mouth daily.     Marland Kitchen linaclotide (LINZESS) 290 MCG CAPS capsule Take 1 capsule (290 mcg total) by mouth daily. 90 capsule 3  . metroNIDAZOLE (METROCREAM) 0.75 % cream Apply topically 2 (two) times daily. 45 g 0  . omeprazole (PRILOSEC) 20 MG capsule TAKE 1 CAPSULE (20 MG TOTAL) BY MOUTH DAILY. 90 capsule 2  . prochlorperazine (COMPAZINE) 10 MG tablet TAKE 1 TABLET (10 MG TOTAL) BY MOUTH EVERY 6 (SIX) HOURS AS NEEDED. 30 tablet 2  . QUEtiapine (SEROQUEL) 400 MG tablet     . traMADol (ULTRAM) 50 MG tablet TAKE 1 TABLET 3 TIMES A DAY AS NEEDED 30 tablet 0  . VIAGRA 100 MG tablet TAKE 1 TABLET DAILY AS NEEDED FOR ERECTILE DYSFUNCTION 6 tablet 0  . [DISCONTINUED] omeprazole (PRILOSEC OTC) 20 MG tablet 1 tablet twice daily. Do not eat for one  hour after ingestion of the medication 60 tablet 11   No current facility-administered medications on file prior to visit.     BP 138/72 (BP Location: Left Arm, Patient Position: Sitting, Cuff Size: Normal)   Pulse 82   Temp 98.1 F (36.7 C) (Oral)   Ht 5\' 8"  (1.727 m)   Wt 202 lb 4.8 oz (91.8 kg)   SpO2 97%   BMI 30.76 kg/m     Review of Systems  Constitutional: Positive for fatigue. Negative for appetite change, chills and fever.  HENT: Positive for congestion, postnasal drip, rhinorrhea and sinus pressure. Negative for dental problem, ear pain, hearing loss, sore throat, tinnitus, trouble swallowing and voice change.   Eyes: Negative for pain, discharge and visual disturbance.  Respiratory: Negative for cough, chest tightness, wheezing and stridor.   Cardiovascular: Negative for chest pain, palpitations and leg swelling.  Gastrointestinal: Negative for abdominal distention, abdominal pain, blood in stool, constipation, diarrhea, nausea and vomiting.  Genitourinary: Negative for difficulty urinating, discharge, flank pain, genital sores, hematuria and urgency.  Musculoskeletal: Negative for arthralgias, back pain, gait problem, joint swelling, myalgias and neck stiffness.  Skin: Negative for rash.  Neurological: Negative for dizziness, syncope, speech difficulty, weakness, numbness and headaches.  Hematological: Negative for adenopathy. Does not bruise/bleed easily.  Psychiatric/Behavioral: Negative for behavioral problems and dysphoric mood. The patient is not nervous/anxious.        Objective:   Physical Exam  Constitutional: He is oriented to person, place, and time. He appears well-developed.  HENT:  Head: Normocephalic.  Right Ear: External ear normal.  Left Ear: External ear normal.  marked sinus congestion  Eyes: Conjunctivae and EOM are normal.  Neck: Normal range of motion.  Cardiovascular: Normal rate and normal heart sounds.   Pulmonary/Chest: Breath sounds  normal.  Abdominal: Bowel sounds are normal.  Genitourinary: Prostate normal. Rectal exam shows guaiac negative stool.  Musculoskeletal: Normal range of motion. He exhibits no edema or tenderness.  Neurological: He is alert and oriented to person, place, and time.  Psychiatric: He has a normal mood and affect. His behavior is normal.          Assessment & Plan:   Normal prostate exam Testosterone deficiency Allergic rhinitis.  A nonsedating antihistamine.  Discussed patient instructions dispensed  Follow-up here when necessary  Nyoka Cowden

## 2017-01-01 DIAGNOSIS — G609 Hereditary and idiopathic neuropathy, unspecified: Secondary | ICD-10-CM | POA: Diagnosis not present

## 2017-01-01 DIAGNOSIS — K219 Gastro-esophageal reflux disease without esophagitis: Secondary | ICD-10-CM | POA: Diagnosis not present

## 2017-01-02 ENCOUNTER — Ambulatory Visit (INDEPENDENT_AMBULATORY_CARE_PROVIDER_SITE_OTHER): Payer: 59 | Admitting: Gastroenterology

## 2017-01-02 ENCOUNTER — Encounter: Payer: Self-pay | Admitting: Gastroenterology

## 2017-01-02 VITALS — BP 96/70 | HR 100 | Ht 68.0 in | Wt 202.0 lb

## 2017-01-02 DIAGNOSIS — K5909 Other constipation: Secondary | ICD-10-CM | POA: Diagnosis not present

## 2017-01-02 DIAGNOSIS — K3184 Gastroparesis: Secondary | ICD-10-CM | POA: Diagnosis not present

## 2017-01-02 NOTE — Progress Notes (Signed)
Review of pertinent gastrointestinal problems:  1. routine risk for colon cancer, colonoscopy March 2014 found diverticulosis only. Recommended for recall colonoscopy at 10 year interval  2. Nausea, dysphagia, weight loss?. Gastroparesis+ : EGD 03/2013 found H. Pylori Neg gastritis, + retained food in stomach. GES 04/2013 confirmed gastroparesis. 05/2013 Reglan 5mg  tid started without much help. 10/14 altered reglan dosing. Repeat EGD 07/2016 Dr. Ardis Hughs mild esophagitis and H. Pylori negative gastritis.  Recommended reglan resumption 5mg  pill before meals, protonix before BF, ranitidine at bedtime. 3. Left flank pain 10/14 underwent non-contrast CT scan abd/pelvis; was normal   HPI: This is a  very pleasant 46 year old man whom I last saw 5 months ago the time of an upper endoscopy. See those results summarized above.  Gets by pretty well on linzess and the other GI meds pretty well.  No real GI complaints.  Chief complaint is constipation, gastroparesis.  ROS: complete GI ROS as described in HPI.  Constitutional:  No unintentional weight loss   Past Medical History:  Diagnosis Date  . ALLERGIC RHINITIS 05/10/2009  . Allergy    anemia  . Anxiety   . Bipolar disorder ([place])    see medications patient to bring  . Chronic headaches   . DDD (degenerative disc disease)   . DEPRESSION 05/10/2009  . Gastroparesis   . GERD 05/10/2009  . LIBIDO, DECREASED 05/10/2009  . Neuromuscular disorder ([place])    pt. denies 08/01/16  . Osgood-Schlatter's disease   . SLEEP APNEA, OBSTRUCTIVE 06/12/2009   CPAP not utilized after 30 lb. weight loss  . SMOKER 06/12/2009  . TESTICULAR HYPOFUNCTION 06/12/2009  . WEIGHT GAIN 05/10/2009    Past Surgical History:  Procedure Laterality Date  . ANKLE SURGERY Left   . COLONOSCOPY  2014   normal  . EYE SURGERY     eye implant  . FRACTURE SURGERY     surgery on right ankle for ligament torn 2013  . LASIK Bilateral   . SHOULDER ARTHROSCOPY WITH DISTAL CLAVICLE  RESECTION Right 02/26/2015   Procedure: SHOULDER DIAGNOSTIC ARTHROSCOPY WITH OPEN DISTAL CLAVICLE EXCISION;  Surgeon: Tania Ade, MD;  Location: [facility];  Service: Orthopedics;  Laterality: Right;  Right diagnostic arthroscopy, open distal clavical excision    Current Outpatient Prescriptions  Medication Sig Dispense Refill  . ALPRAZolam (XANAX) 1 MG tablet Take 1 tablet as needed    . cloNIDine (CATAPRES) 0.1 MG tablet TAKE 1 OR 2 TABLETS BY MOUTH AT BEDTIME  12  . fluticasone (FLONASE) 50 MCG/ACT nasal spray Place 2 sprays into both nostrils daily. 16 g 6  . lamoTRIgine (LAMICTAL) 150 MG tablet Take 300 mg by mouth daily.     Marland Kitchen linaclotide (LINZESS) 290 MCG CAPS capsule Take 1 capsule (290 mcg total) by mouth daily. 90 capsule 3  . metroNIDAZOLE (METROCREAM) 0.75 % cream Apply topically 2 (two) times daily. 45 g 0  . omeprazole (PRILOSEC) 20 MG capsule TAKE 1 CAPSULE (20 MG TOTAL) BY MOUTH DAILY. 90 capsule 2  . prochlorperazine (COMPAZINE) 10 MG tablet TAKE 1 TABLET (10 MG TOTAL) BY MOUTH EVERY 6 (SIX) HOURS AS NEEDED. 30 tablet 2  . QUEtiapine (SEROQUEL) 400 MG tablet     . traMADol (ULTRAM) 50 MG tablet TAKE 1 TABLET 3 TIMES A DAY AS NEEDED 30 tablet 0  . VIAGRA 100 MG tablet TAKE 1 TABLET DAILY AS NEEDED FOR ERECTILE DYSFUNCTION 6 tablet 0   No current facility-administered medications for this visit.  Allergies as of 01/02/2017 - Review Complete 01/02/2017  Allergen Reaction Noted  . Topamax [topiramate]  02/26/2016    Family History  Problem Relation Age of Onset  . Heart disease Maternal Grandfather   . Irritable bowel syndrome Father   . Alcoholism Father   . Heart attack Father   . Bipolar disorder Father   . Colon cancer Neg Hx   . Rectal cancer Neg Hx   . Stomach cancer Neg Hx     Social History   Social History  . Marital status: Married    Spouse name: [name]  . Number of children: 0  . Years of education: 14   Occupational  History  . Unemployeed    Social History Main Topics  . Smoking status: Current Every Day Smoker    Packs/day: 0.50    Years: 15.00    Types: Cigarettes  . Smokeless tobacco: Former Systems developer    Types: [place] date: 09/01/1998     Comment: tobacco info given 08/01/13  . Alcohol use 0.6 oz/week    1 Cans of beer per week     Comment: social, rare  . Drug use: No  . Sexual activity: Yes   Other Topics Concern  . Not on file   Social History Narrative   Lives with wife at home   Caffeine use- tea, 3 glasses daily     Physical Exam: BP 96/70   Pulse 100   Ht 5\' 8"  (1.727 m)   Wt 202 lb (91.6 kg)   BMI 30.71 kg/m  Constitutional: generally well-appearing Psychiatric: alert and oriented x3 Abdomen: soft, nontender, nondistended, no obvious ascites, no peritoneal signs, normal bowel sounds No peripheral edema noted in lower extremities  Assessment and plan: 46 y.o. male with Chronic constipation, gastroparesis  Both of these conditions seem well controlled on his current medical regimen.  He brought some disability paperwork that his attorney asked him for me to fill out. I reviewed this with him. Questions pertaining to physical exertion issues such as how long he can stand, however he can sit, however to wait he can hold. How many hours he can work in a row. I told him that I don't think I'm the correct MD to answer all these survey questions because his GI issues do not really disturb his ability to do any of these things. He is going to bring a copy of this note to his attorney for review and if they still feel like I should fill out the disability paperwork I'll be happy to.  Please see the "Patient Instructions" section for addition details about the plan.  Owens Loffler, MD [facility] 01/02/2017, 3:51 PM

## 2017-01-04 ENCOUNTER — Other Ambulatory Visit: Payer: Self-pay | Admitting: Physician Assistant

## 2017-01-08 ENCOUNTER — Other Ambulatory Visit: Payer: Self-pay | Admitting: Physician Assistant

## 2017-01-09 ENCOUNTER — Other Ambulatory Visit: Payer: Self-pay | Admitting: Internal Medicine

## 2017-01-12 ENCOUNTER — Telehealth: Payer: Self-pay | Admitting: *Deleted

## 2017-01-12 NOTE — Telephone Encounter (Signed)
Last OV was 12/30/16, labs 02/22/16 last refill was 12/19/16 for #30 with 0 refill. Please advise

## 2017-01-12 NOTE — Telephone Encounter (Signed)
This patient's pharmacy requested refill on Reglan 5 mg.  Patient saw Nicoletta Ba PA 06/2016 and had an EGD with Dr. Ardis Hughs in the [facility] 08/2016.  Dr. Ardis Hughs noted on the EGD report that the patient is to continue the Reglan 5 mg 3 times daily before meals.  Per Christian Mate RN, I can send refills for the patient.  Sent # 90 with 3 refills.

## 2017-01-13 NOTE — Telephone Encounter (Signed)
Prescription for Tramadol 50 mg #30 with 0 refill was phoned in to patient pharmacy [facility].

## 2017-01-13 NOTE — Telephone Encounter (Signed)
Okay to refill? 

## 2017-01-20 ENCOUNTER — Other Ambulatory Visit: Payer: Self-pay | Admitting: Internal Medicine

## 2017-02-07 ENCOUNTER — Other Ambulatory Visit: Payer: Self-pay | Admitting: Internal Medicine

## 2017-02-13 ENCOUNTER — Encounter: Payer: Self-pay | Admitting: Internal Medicine

## 2017-02-13 ENCOUNTER — Ambulatory Visit (INDEPENDENT_AMBULATORY_CARE_PROVIDER_SITE_OTHER): Payer: 59 | Admitting: Internal Medicine

## 2017-02-13 VITALS — BP 108/80 | HR 98 | Temp 98.0°F | Wt 201.0 lb

## 2017-02-13 DIAGNOSIS — M25512 Pain in left shoulder: Secondary | ICD-10-CM

## 2017-02-13 DIAGNOSIS — K3184 Gastroparesis: Secondary | ICD-10-CM | POA: Diagnosis not present
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
## 2017-02-13 NOTE — Patient Instructions (Signed)
Call or return to clinic prn if these symptoms worsen or fail to improve as anticipated.  Report any new or worsening symptoms.  Orthopedic follow-up as scheduled

## 2017-03-03 ENCOUNTER — Other Ambulatory Visit: Payer: Self-pay | Admitting: Internal Medicine

## 2017-03-06 NOTE — Telephone Encounter (Signed)
Okay for refill?  

## 2017-03-16 ENCOUNTER — Other Ambulatory Visit: Payer: Self-pay | Admitting: Internal Medicine

## 2017-03-31 ENCOUNTER — Ambulatory Visit (INDEPENDENT_AMBULATORY_CARE_PROVIDER_SITE_OTHER): Payer: 59 | Admitting: Internal Medicine

## 2017-03-31 ENCOUNTER — Encounter: Payer: Self-pay | Admitting: Internal Medicine

## 2017-03-31 VITALS — BP 118/62 | HR 95 | Temp 98.1°F | Ht 68.0 in | Wt 196.8 lb

## 2017-03-31 DIAGNOSIS — F3130 Bipolar disorder, current episode depressed, mild or moderate severity, unspecified: Secondary | ICD-10-CM | POA: Diagnosis not present

## 2017-03-31 DIAGNOSIS — F17200 Nicotine dependence, unspecified, uncomplicated: Secondary | ICD-10-CM | POA: Diagnosis not present

## 2017-03-31 DIAGNOSIS — G40409 Other generalized epilepsy and epileptic syndromes, not intractable, without status epilepticus: Secondary | ICD-10-CM

## 2017-03-31 DIAGNOSIS — F319 Bipolar disorder, unspecified: Secondary | ICD-10-CM

## 2017-03-31 DIAGNOSIS — K3184 Gastroparesis: Secondary | ICD-10-CM

## 2017-03-31 DIAGNOSIS — M5416 Radiculopathy, lumbar region: Secondary | ICD-10-CM

## 2017-03-31 DIAGNOSIS — G609 Hereditary and idiopathic neuropathy, unspecified: Secondary | ICD-10-CM | POA: Diagnosis not present

## 2017-03-31 LAB — TSH: TSH: 2.47 u[IU]/mL (ref 0.35–4.50)

## 2017-03-31 LAB — CBC WITH DIFFERENTIAL/PLATELET
BASOS ABS: 0.1 10*3/uL (ref 0.0–0.1)
BASOS PCT: 0.9 % (ref 0.0–3.0)
EOS ABS: 0.2 10*3/uL (ref 0.0–0.7)
Eosinophils Relative: 2.8 % (ref 0.0–5.0)
HEMATOCRIT: 48 % (ref 39.0–52.0)
HEMOGLOBIN: 15.9 g/dL (ref 13.0–17.0)
LYMPHS PCT: 23.9 % (ref 12.0–46.0)
Lymphs Abs: 2 10*3/uL (ref 0.7–4.0)
MCHC: 33.1 g/dL (ref 30.0–36.0)
MCV: 89.3 fl (ref 78.0–100.0)
MONOS PCT: 10.1 % (ref 3.0–12.0)
Monocytes Absolute: 0.8 10*3/uL (ref 0.1–1.0)
NEUTROS ABS: 5.2 10*3/uL (ref 1.4–7.7)
Neutrophils Relative %: 62.3 % (ref 43.0–77.0)
PLATELETS: 306 10*3/uL (ref 150.0–400.0)
RBC: 5.38 Mil/uL (ref 4.22–5.81)
RDW: 15.5 % (ref 11.5–15.5)
WBC: 8.4 10*3/uL (ref 4.0–10.5)

## 2017-03-31 LAB — COMPREHENSIVE METABOLIC PANEL
ALBUMIN: 4 g/dL (ref 3.5–5.2)
ALT: 9 U/L (ref 0–53)
AST: 10 U/L (ref 0–37)
Alkaline Phosphatase: 81 U/L (ref 39–117)
BILIRUBIN TOTAL: 0.4 mg/dL (ref 0.2–1.2)
BUN: 10 mg/dL (ref 6–23)
CALCIUM: 9.4 mg/dL (ref 8.4–10.5)
CHLORIDE: 100 meq/L (ref 96–112)
CO2: 30 meq/L (ref 19–32)
CREATININE: 0.9 mg/dL (ref 0.40–1.50)
GFR: 95.6 mL/min (ref >60.00)
Glucose, Bld: 100 mg/dL — ABNORMAL HIGH (ref 70–99)
Potassium: 4.5 mEq/L (ref 3.5–5.1)
Sodium: 136 mEq/L (ref 135–145)
Total Protein: 7 g/dL (ref 6.0–8.3)

## 2017-03-31 NOTE — Patient Instructions (Addendum)
  Do not drive  Neurology follow-up as discussed  Discontinue tramadol  Seizure precautions.  Do not drive, climb ladders swim unattended, etc.

## 2017-03-31 NOTE — Progress Notes (Signed)
Subjective:    Patient ID: Sean Wu, male    DOB: 1970/10/07, 46 y.o.   MRN: [id number]  HPI   46 year old patient who presents today with concerns about a possible seizure disorder. 3 days ago at 8 PM, while in bed with his wife, he developed an episode of severe thrashing of arms and legs that was quite vigorous.  He states that he knocked objects off the bedside table.  Duration of the of that was 60 minutes.  No prodromal symptoms.  Postictal symptoms included weakness, only.  No tongue biting or incontinence noted. He states that he eventually took a Xanax with resolution of the tonic-clonic activity. He is followed by psychiatry and does have a history of bipolar disorder with depression. Last night he had a similar episode that occurred also at 8 PM, with his wife present.  This episode was much briefer but lasted 15 minutes.  Again, stopped after ingestion of his Xanax. Neither of these episodes were associated with loss of consciousness.  He states that he was able to converse with his wife during these episodes.  He does have a history migraine headaches and have had MRIs in the past  He has had recent before meals shoulder surgery, and Saturday, did take a tramadol.  He has been prescribed Xanax in the past, which he takes only rarely  Past Medical History:  Diagnosis Date  . ALLERGIC RHINITIS 05/10/2009  . Allergy    anemia  . Anxiety   . Bipolar disorder ([place])    see medications patient to bring  . Chronic headaches   . DDD (degenerative disc disease)   . DEPRESSION 05/10/2009  . Gastroparesis   . GERD 05/10/2009  . LIBIDO, DECREASED 05/10/2009  . Neuromuscular disorder ([place])    pt. denies 08/01/16  . Osgood-Schlatter's disease   . SLEEP APNEA, OBSTRUCTIVE 06/12/2009   CPAP not utilized after 30 lb. weight loss  . SMOKER 06/12/2009  . TESTICULAR HYPOFUNCTION 06/12/2009  . WEIGHT GAIN 05/10/2009     Social History   Social History  . Marital status: Married   Spouse name: [name]  . Number of children: 0  . Years of education: 14   Occupational History  . Unemployeed    Social History Main Topics  . Smoking status: Current Every Day Smoker    Packs/day: 0.50    Years: 15.00    Types: Cigarettes  . Smokeless tobacco: Former Systems developer    Types: [place] date: 09/01/1998     Comment: tobacco info given 08/01/13  . Alcohol use 0.6 oz/week    1 Cans of beer per week     Comment: social, rare  . Drug use: No  . Sexual activity: Yes   Other Topics Concern  . Not on file   Social History Narrative   Lives with wife at home   Caffeine use- tea, 3 glasses daily    Past Surgical History:  Procedure Laterality Date  . ANKLE SURGERY Left   . COLONOSCOPY  2014   normal  . EYE SURGERY     eye implant  . FRACTURE SURGERY     surgery on right ankle for ligament torn 2013  . LASIK Bilateral   . SHOULDER ARTHROSCOPY WITH DISTAL CLAVICLE RESECTION Right 02/26/2015   Procedure: SHOULDER DIAGNOSTIC ARTHROSCOPY WITH OPEN DISTAL CLAVICLE EXCISION;  Surgeon: Tania Ade, MD;  Location: [facility];  Service: Orthopedics;  Laterality: Right;  Right diagnostic arthroscopy,  open distal clavical excision    Family History  Problem Relation Age of Onset  . Heart disease Maternal Grandfather   . Irritable bowel syndrome Father   . Alcoholism Father   . Heart attack Father   . Bipolar disorder Father   . Colon cancer Neg Hx   . Rectal cancer Neg Hx   . Stomach cancer Neg Hx     Allergies  Allergen Reactions  . Topamax [Topiramate]     Sleep walking, hallucinations, "I get mean, can't help it"    Current Outpatient Prescriptions on File Prior to Visit  Medication Sig Dispense Refill  . ALPRAZolam (XANAX) 1 MG tablet Take 1 tablet as needed    . cloNIDine (CATAPRES) 0.1 MG tablet TAKE 1 OR 2 TABLETS BY MOUTH AT BEDTIME  12  . lamoTRIgine (LAMICTAL) 150 MG tablet Take 300 mg by mouth daily.     Marland Kitchen linaclotide (LINZESS) 290 MCG  CAPS capsule Take 1 capsule (290 mcg total) by mouth daily. 90 capsule 3  . pantoprazole (PROTONIX) 40 MG tablet TAKE 1 TABLET TWICE A DAY 60 tablet 6  . prochlorperazine (COMPAZINE) 10 MG tablet TAKE 1 TABLET (10 MG TOTAL) BY MOUTH EVERY 6 (SIX) HOURS AS NEEDED. 30 tablet 2  . QUEtiapine (SEROQUEL) 400 MG tablet     . traMADol (ULTRAM) 50 MG tablet TAKE 1 TABLET 3 TIMES A DAY AS NEEDED 30 tablet 0  . [DISCONTINUED] omeprazole (PRILOSEC OTC) 20 MG tablet 1 tablet twice daily. Do not eat for one hour after ingestion of the medication 60 tablet 11   No current facility-administered medications on file prior to visit.     BP 118/62 (BP Location: Left Arm, Patient Position: Sitting, Cuff Size: Normal)   Pulse 95   Temp 98.1 F (36.7 C) (Oral)   Ht 5\' 8"  (1.727 m)   Wt 196 lb 12.8 oz (89.3 kg)   SpO2 98%   BMI 29.92 kg/m     Review of Systems  Constitutional: Positive for activity change and fatigue. Negative for appetite change, chills and fever.  HENT: Negative for congestion, dental problem, ear pain, hearing loss, sore throat, tinnitus, trouble swallowing and voice change.   Eyes: Negative for pain, discharge and visual disturbance.  Respiratory: Negative for cough, chest tightness, wheezing and stridor.   Cardiovascular: Negative for chest pain, palpitations and leg swelling.  Gastrointestinal: Negative for abdominal distention, abdominal pain, blood in stool, constipation, diarrhea, nausea and vomiting.  Genitourinary: Negative for difficulty urinating, discharge, flank pain, genital sores, hematuria and urgency.  Musculoskeletal: Negative for arthralgias, back pain, gait problem, joint swelling, myalgias and neck stiffness.  Skin: Negative for rash.  Neurological: Positive for weakness. Negative for dizziness, syncope, speech difficulty, numbness and headaches.  Hematological: Negative for adenopathy. Does not bruise/bleed easily.  Psychiatric/Behavioral: Negative for behavioral  problems and dysphoric mood. The patient is not nervous/anxious.        Objective:   Physical Exam  Constitutional: He is oriented to person, place, and time. He appears well-developed and well-nourished. No distress.  Neurological: He is alert and oriented to person, place, and time. No cranial nerve deficit. Coordination normal.  .  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  Patient will follow with neurology Seizure precautions discussed Tramadol discontinued  History of bipolar depression History of migraine headaches  Deundre Thong Pilar Plate

## 2017-04-09 LAB — DRUG ABUSE PANEL 10-50, U
AMPHETAMINES (1000 NG/ML SCRN): NEGATIVE
BARBITURATES: NEGATIVE
BENZODIAZEPINES: POSITIVE — AB
COCAINE METABOLITES: POSITIVE — AB
MARIJUANA MET (50 ng/mL SCRN): NEGATIVE
METHADONE: NEGATIVE
METHAQUALONE: NEGATIVE
OPIATES: NEGATIVE
PHENCYCLIDINE: NEGATIVE
PROPOXYPHENE: NEGATIVE

## 2017-05-19 ENCOUNTER — Ambulatory Visit (INDEPENDENT_AMBULATORY_CARE_PROVIDER_SITE_OTHER): Payer: 59 | Admitting: Internal Medicine

## 2017-05-19 ENCOUNTER — Encounter: Payer: Self-pay | Admitting: Internal Medicine

## 2017-05-19 DIAGNOSIS — Z87898 Personal history of other specified conditions: Secondary | ICD-10-CM

## 2017-05-19 DIAGNOSIS — F1911 Other psychoactive substance abuse, in remission: Secondary | ICD-10-CM

## 2017-05-19 DIAGNOSIS — F1021 Alcohol dependence, in remission: Secondary | ICD-10-CM | POA: Insufficient documentation

## 2017-05-19 NOTE — Progress Notes (Signed)
Subjective:    Patient ID: Sean Wu, male    DOB: December 12, 1970, 46 y.o.   MRN: [id number]  HPI  46 year old patient who presents with the chief complaint of decreased auditory acuity from the left ear. He was noted have a cerumen impaction which was irrigated until clear  In July, he was noted have a positive urine drug screen for cocaine.  This was discussed today and he states that this was a one-time event at a party and does not use this medication chronically.  He was also seen in July for a possible seizure versus PNES, the latter favored.  Tramadol was discontinued at that time.  He has had no further events.  His only control drug is alprazolam which is refilled by psychiatry every 3 months.  Past Medical History:  Diagnosis Date  . ALLERGIC RHINITIS 05/10/2009  . Allergy    anemia  . Anxiety   . Bipolar disorder ([place])    see medications patient to bring  . Chronic headaches   . DDD (degenerative disc disease)   . DEPRESSION 05/10/2009  . Gastroparesis   . GERD 05/10/2009  . LIBIDO, DECREASED 05/10/2009  . Neuromuscular disorder ([place])    pt. denies 08/01/16  . Osgood-Schlatter's disease   . SLEEP APNEA, OBSTRUCTIVE 06/12/2009   CPAP not utilized after 30 lb. weight loss  . SMOKER 06/12/2009  . TESTICULAR HYPOFUNCTION 06/12/2009  . WEIGHT GAIN 05/10/2009     Social History   Social History  . Marital status: Married    Spouse name: [name]  . Number of children: 0  . Years of education: 14   Occupational History  . Unemployeed    Social History Main Topics  . Smoking status: Current Every Day Smoker    Packs/day: 0.50    Years: 15.00    Types: Cigarettes  . Smokeless tobacco: Former Systems developer    Types: [place] date: 09/01/1998     Comment: tobacco info given 08/01/13  . Alcohol use 0.6 oz/week    1 Cans of beer per week     Comment: social, rare  . Drug use: No  . Sexual activity: Yes   Other Topics Concern  . Not on file   Social History Narrative   Lives with wife at home   Caffeine use- tea, 3 glasses daily    Past Surgical History:  Procedure Laterality Date  . ANKLE SURGERY Left   . COLONOSCOPY  2014   normal  . EYE SURGERY     eye implant  . FRACTURE SURGERY     surgery on right ankle for ligament torn 2013  . LASIK Bilateral   . SHOULDER ARTHROSCOPY WITH DISTAL CLAVICLE RESECTION Right 02/26/2015   Procedure: SHOULDER DIAGNOSTIC ARTHROSCOPY WITH OPEN DISTAL CLAVICLE EXCISION;  Surgeon: Tania Ade, MD;  Location: [facility];  Service: Orthopedics;  Laterality: Right;  Right diagnostic arthroscopy, open distal clavical excision    Family History  Problem Relation Age of Onset  . Heart disease Maternal Grandfather   . Irritable bowel syndrome Father   . Alcoholism Father   . Heart attack Father   . Bipolar disorder Father   . Colon cancer Neg Hx   . Rectal cancer Neg Hx   . Stomach cancer Neg Hx     Allergies  Allergen Reactions  . Topamax [Topiramate]     Sleep walking, hallucinations, "I get mean, can't help it"    Current Outpatient Prescriptions on  File Prior to Visit  Medication Sig Dispense Refill  . ALPRAZolam (XANAX) 1 MG tablet Take 1 tablet as needed    . cloNIDine (CATAPRES) 0.1 MG tablet TAKE 1 OR 2 TABLETS BY MOUTH AT BEDTIME  12  . lamoTRIgine (LAMICTAL) 150 MG tablet Take 300 mg by mouth daily.     Marland Kitchen linaclotide (LINZESS) 290 MCG CAPS capsule Take 1 capsule (290 mcg total) by mouth daily. 90 capsule 3  . pantoprazole (PROTONIX) 40 MG tablet TAKE 1 TABLET TWICE A DAY 60 tablet 6  . prochlorperazine (COMPAZINE) 10 MG tablet TAKE 1 TABLET (10 MG TOTAL) BY MOUTH EVERY 6 (SIX) HOURS AS NEEDED. 30 tablet 2  . QUEtiapine (SEROQUEL) 400 MG tablet     . [DISCONTINUED] omeprazole (PRILOSEC OTC) 20 MG tablet 1 tablet twice daily. Do not eat for one hour after ingestion of the medication 60 tablet 11   No current facility-administered medications on file prior to visit.     BP 122/60 (BP  Location: Left Arm, Patient Position: Sitting, Cuff Size: Normal)   Pulse 79   Temp 97.7 F (36.5 C) (Oral)   Ht 5\' 8"  (1.727 m)   Wt 200 lb 3.2 oz (90.8 kg)   SpO2 98%   BMI 30.44 kg/m     Review of Systems  Constitutional: Negative.   HENT: Positive for hearing loss.        Objective:   Physical Exam  Constitutional: He appears well-developed and well-nourished. No distress.  HENT:  Mouth/Throat: Oropharynx is clear and moist.  Both canals now free of cerumen          Assessment & Plan:   Cerumen impaction, left ear.  Of canal irrigated until clear History of substance abuse  (  Urine drug screen positive for cocaine).  Patient counseled and given information concerning substance abuse as well as information concerning available.  Local treatment.  Follow-up psychiatry  Nyoka Cowden

## 2017-05-19 NOTE — Patient Instructions (Addendum)
Stimulant Use Disorder-Cocaine Cocaine belongs to a group of powerful drugs known as stimulants. Common street names for cocaine include coke, crack, blow, snow, C, powder, and nose candy. Cocaine has some medical uses, but it is often misused because of the effects that it produces. These effects include:  A feeling of extreme pleasure (euphoria).  Alertness.  A high energy level.  Stimulant use disorder is when your stimulant use disrupts your daily life. It may disrupt your relationships and how you do your job. Stimulant use disorder can be dangerous. Cocaine increases your blood pressure and heart rate. Using it can lead to a heart attack or stroke. Cocaine can also make your heart rate irregular and cause seizures. These problems can lead to death. What are the causes? This condition is caused by misusing cocaine. Many people start using cocaine because it makes them feel good. Over time, they get addicted to it. When they try to stop using it, they feel sick. What increases the risk? This condition is more likely to develop in:  People who misuse other drugs.  People with a family history of misusing drugs.  What are the signs or symptoms? Symptoms of this condition include:  Using greater amounts of cocaine than you want to, or using cocaine for longer than you want to.  Trying several times to use less cocaine or to control your cocaine use.  Craving cocaine.  Spending a lot of time getting cocaine, using it, or recovering from its effects.  Having problems at work, at school, at home, or with relationships because of cocaine use.  Giving up or cutting down on important life activities because of cocaine use.  Using cocaine when it is dangerous, such as when driving a car.  Continuing to use cocaine even though it is causing or has led to a physical problem, such as: ? Malnutrition. ? Nosebleeds. ? Chest pain. ? High blood pressure. ? A hole between the part of your  nose that separates your nostrils (perforated nasal septum). ? Lung and kidney damage.  Continuing to use cocaine even though it is causing a mental problem, such as: ? Schizophrenia-like symptoms. ? Depression. ? Bipolar mood swings. ? Anxiety. ? Sleep problems.  Needing more and more cocaine to get the same effect that you want (building up a tolerance).  Having symptoms of withdrawal when you stop using cocaine. Symptoms of withdrawal include: ? Depression. ? Irritability. ? Low energy. ? Restlessness. ? Bad dreams. ? Too little or too much sleep. ? Increased appetite.  How is this diagnosed? This condition is diagnosed with an assessment. During the assessment, your health care provider will ask about your cocaine use and about how it affects your life. Your health care provider may also:  Perform a physical exam or do lab tests to see if you have physical problems resulting from cocaine use.  Screen for drug use.  Refer you to a mental health professional for evaluation.  How is this treated? Treatment for this condition is usually provided by mental health professionals with training in substance use disorders. Treatment may involve:  Counseling. This treatment is also called talk therapy. It is provided by substance use treatment counselors. A counselor can address the reasons you use cocaine and suggest ways to keep you from using it again. The goals of talk therapy are to: ? Find healthy activities to replace using cocaine. ? Identify and avoid what triggers your cocaine use. ? Help you learn how to handle  cravings.  Support groups. Support groups are run by people who have quit using stimulants. They provide emotional support, advice, and guidance.  Medicines.  Follow these instructions at home:  Take over-the-counter and prescription medicines only as told by your health care provider.  Check with your health care provider before starting any new  medicines.  Do not use any products that contain nicotine or tobacco, such as cigarettes and e-cigarettes. If you need help quitting, ask your health care provider.  Keep all follow-up visits as told by your health care provider. This is important. Where to find more information:  [employer] on Drug Abuse: motorcyclefax.com  Substance Abuse and Mental Health Services Administration: ktimeonline.com Contact a health care provider if:  You are not able to take your medicines as told.  You use cocaine again.  Your symptoms get worse. Get help right away if:  You have serious thoughts about hurting yourself or others.  You have a seizure.  You have chest pain.  You have sudden weakness.  You lose some of your vision.  You lose some of your speech. If you ever feel like you may hurt yourself or others, or have thoughts about taking your own life, get help right away. You can go to your nearest emergency department or call:  Your local emergency services (911 in the [place]  A suicide crisis helpline, such as the [place] at (254)358-6397. This is open 24 hours a day.  This information is not intended to replace advice given to you by your health care provider. Make sure you discuss any questions you have with your health care provider. Document Released: 08/15/2000 Document Revised: 05/30/2016 Document Reviewed: 05/30/2016 Elsevier Interactive Patient Education  2018 [employer].  Finding Treatment for Addiction What is addiction? Addiction is a complex disease of the brain. It causes an uncontrollable (compulsive) need for a substance. You can be addicted to alcohol, illegal drugs, or prescription medicines such as painkillers. Addiction can also be a behavior, like gambling or shopping. The need for the drug or activity can become so strong that you think about it all the time. You can also become physically dependent on a  substance. Addiction can change the way your brain works. Because of these changes, getting more of whatever you are addicted to becomes the most important thing to you and feels better than other activities or relationships. Addiction can lead to changes in health, behavior, emotions, relationships, and choices that affect you and everyone around you. How do I know if I need treatment for addiction? Addiction is a progressive disease. Without treatment, addiction can get worse. Living with addiction puts you at higher risk for injury, poor health, lost employment, loss of money, and even death. You might need treatment for addiction if:  You have tried to stop or cut down, but you cannot.  Your addiction is causing physical health problems.  You find it annoying that your friends and family are concerned about your alcohol or substance use.  You feel guilty about substance abuse or a compulsive behavior.  You have lied or tried to hide your addiction.  You need a particular substance or activity to start your day or to calm down.  You are getting in trouble at school, work, home, or with the police.  You have done something illegal to support your addiction.  You are running out of money because of your addiction.  You have no time for anything other than your  addiction.  What types of treatment are available? The treatment program that is right for you will depend on many factors, including the type of addiction you have. Treatment programs can be outpatient or inpatient. In an outpatient program, you live at home and go to work or school, but you also go to a clinic for treatment. With an inpatient program, you live and sleep at the program facility during treatment. After treatment, you might need a plan for support during recovery. Other treatment options include:  Medicine. ? Some addictions may be treated with prescription medicines. ? You might also need medicine to treat  anxiety or depression.  Counseling and behavior therapy. Therapy can help individuals and families behave in healthier ways and relate more effectively.  Support groups. Confidential group therapy, such as a 12-step program, can help individuals and families during treatment and recovery.  No single type of program is right for everyone. Many treatment programs involve a combination of education, counseling, and a 12-step, spiritually-based approach. Some treatment programs are government sponsored. They are geared for patients who do not have private insurance. Treatment programs can vary in many respects, such as:  Cost and types of insurance that are accepted.  Types of on-site medical services that are offered.  Length of stay, setting, and size.  Overall philosophy of treatment.  What should I consider when selecting a treatment program? It is important to think about your individual requirements when selecting a treatment program. There are a number of things to consider, such as:  If the program is certified by the appropriate government agency. Even private programs must be certified and employ certified professionals.  If the program is covered by your insurance. If finances are a concern, the first call you should make is to your insurance company, if you have health insurance. Ask for a list of treatment programs that are in your network, and confirm any copayments and deductibles that you may have to pay. ? If you do not have insurance, or if you choose to attend a program that does not accept your insurance, discuss whether a payment plan can be set up.  If treatment is available in languages other than English, if needed.  If the program offers detoxification treatment, if needed.  If 12-step meetings are held at the center or if transport is available for patients to attend meetings at other locations.  If the program is professional, organized, and clean.  If the  program meets all of your needs, including physical and cultural needs.  If the facility offers specific treatment for your particular addiction.  If support continues to be offered after you have left the program.  If your treatment plan is continually looked at to make sure you are receiving the right treatment at the right time.  If mental health counseling is part of your treatment.  If medicine is included in treatment, if needed.  If your family is included in your treatment plan and if support is offered to them throughout the treatment process.  How the treatment works to prevent relapse.  Where else can I get help?  Your health care provider. Ask him or her to help you find addiction treatment. These discussions are confidential.  The [employer] on Alcoholism and Drug Dependence (NCADD). This group has information about treatment centers and programs for people who have an addiction and for family members. ? The telephone number is 1-800-NCA-CALL (548-650-9188). ? The website is https://ncadd.org/about-ncadd/our-affiliates  The Substance Abuse  and [facility]). This group will help you find publicly funded treatment centers, help hotlines, and counseling services near you. ? The telephone number is 1-800-662-HELP (714)516-9847). ? The website is www.findtreatment.SamedayNews.com.cy In countries outside of the [place] and [place], look in [employer] for contact information for services in your area. This information is not intended to replace advice given to you by your health care provider. Make sure you discuss any questions you have with your health care provider. Document Released: 07/17/2005 Document Revised: 07/14/2016 Document Reviewed: 06/06/2014 Elsevier Interactive Patient Education  2017 [place], Adult The ears produce a substance called earwax that helps keep bacteria out of the ear and protects the  skin in the ear canal. Occasionally, earwax can build up in the ear and cause discomfort or hearing loss. What increases the risk? This condition is more likely to develop in people who:  Are male.  Are elderly.  Naturally produce more earwax.  Clean their ears often with cotton swabs.  Use earplugs often.  Use in-ear headphones often.  Wear hearing aids.  Have narrow ear canals.  Have earwax that is overly thick or sticky.  Have eczema.  Are dehydrated.  Have excess hair in the ear canal.  What are the signs or symptoms? Symptoms of this condition include:  Reduced or muffled hearing.  A feeling of fullness in the ear or feeling that the ear is plugged.  Fluid coming from the ear.  Ear pain.  Ear itch.  Ringing in the ear.  Coughing.  An obvious piece of earwax that can be seen inside the ear canal.  How is this diagnosed? This condition may be diagnosed based on:  Your symptoms.  Your medical history.  An ear exam. During the exam, your health care provider will look into your ear with an instrument called an otoscope.  You may have tests, including a hearing test. How is this treated? This condition may be treated by:  Using ear drops to soften the earwax.  Having the earwax removed by a health care provider. The health care provider may: ? Flush the ear with water. ? Use an instrument that has a loop on the end (curette). ? Use a suction device.  Surgery to remove the wax buildup. This may be done in severe cases.  Follow these instructions at home:  Take over-the-counter and prescription medicines only as told by your health care provider.  Do not put any objects, including cotton swabs, into your ear. You can clean the opening of your ear canal with a washcloth or facial tissue.  Follow instructions from your health care provider about cleaning your ears. Do not over-clean your ears.  Drink enough fluid to keep your urine clear or  pale yellow. This will help to thin the earwax.  Keep all follow-up visits as told by your health care provider. If earwax builds up in your ears often or if you use hearing aids, consider seeing your health care provider for routine, preventive ear cleanings. Ask your health care provider how often you should schedule your cleanings.  If you have hearing aids, clean them according to instructions from the manufacturer and your health care provider. Contact a health care provider if:  You have ear pain.  You develop a fever.  You have blood, pus, or other fluid coming from your ear.  You have hearing loss.  You have ringing in your ears that does not go away.  Your symptoms do not improve with treatment.  You feel like the room is spinning (vertigo). Summary  Earwax can build up in the ear and cause discomfort or hearing loss.  The most common symptoms of this condition include reduced or muffled hearing and a feeling of fullness in the ear or feeling that the ear is plugged.  This condition may be diagnosed based on your symptoms, your medical history, and an ear exam.  This condition may be treated by using ear drops to soften the earwax or by having the earwax removed by a health care provider.  Do not put any objects, including cotton swabs, into your ear. You can clean the opening of your ear canal with a washcloth or facial tissue. This information is not intended to replace advice given to you by your health care provider. Make sure you discuss any questions you have with your health care provider. Document Released: 09/25/2004 Document Revised: 10/29/2016 Document Reviewed: 10/29/2016 Elsevier Interactive Patient Education  [employer].

## 2017-05-21 ENCOUNTER — Encounter: Payer: Self-pay | Admitting: Internal Medicine

## 2017-07-29 ENCOUNTER — Telehealth: Payer: Self-pay | Admitting: Family Medicine

## 2017-07-29 NOTE — Telephone Encounter (Signed)
Copied from [place] [id number]. Topic: Referral - Request >> Jul 29, 2017  3:34 PM Vernona Rieger wrote: Reason for CRM:  Pt called and state he wants a referral to [facility] Neuro. Call back number is [phone]

## 2017-07-30 NOTE — Telephone Encounter (Signed)
Left message on voicemail to call office.  What issue is pt having to require Neuro referral? (dx)

## 2017-08-03 NOTE — Telephone Encounter (Signed)
Left message on voicemail to call office.  What issue is pt having to require Neuro referral? (dx)

## 2017-08-11 NOTE — Telephone Encounter (Signed)
Copied from [place] [phone]. Topic: Quick Communication - See Telephone Encounter >> Jul 30, 2017  1:56 PM Robina Ade, Helene Kelp D wrote: Patient called back returning a missed called he had from the office. The reason he needs a referral to see Neurology is because per Dr. Burnice Logan said needs to go there. Also patient said that his head spins, has frequent headaches, he blacks out and fall on the floor. If there are any further questions, feel free to call patient back, thanks.

## 2017-08-11 NOTE — Telephone Encounter (Signed)
Called patient and left message to return call. He is already established w/ neurology, Dr. Leta Baptist. He can call their office to schedule a follow-up appointment.  Recommend seeking emergency treatment if any further episodes of "blacking out and falling on the floor."

## 2017-08-20 ENCOUNTER — Other Ambulatory Visit: Payer: Self-pay | Admitting: Internal Medicine

## 2017-12-30 ENCOUNTER — Other Ambulatory Visit: Payer: Self-pay | Admitting: Internal Medicine

## 2017-12-30 NOTE — Telephone Encounter (Signed)
Okay for refill?  

## 2018-02-18 ENCOUNTER — Other Ambulatory Visit: Payer: Self-pay | Admitting: Gastroenterology

## 2018-02-18 ENCOUNTER — Other Ambulatory Visit: Payer: Self-pay | Admitting: Internal Medicine

## 2018-03-01 ENCOUNTER — Other Ambulatory Visit: Payer: Self-pay | Admitting: Gastroenterology

## 2018-03-01 MED ORDER — LINACLOTIDE 290 MCG PO CAPS: 290.0000 ug | ORAL_CAPSULE | Freq: Every day | ORAL | 0 refills | Status: DC

## 2018-03-15 ENCOUNTER — Other Ambulatory Visit: Payer: Self-pay | Admitting: Internal Medicine

## 2018-03-16 NOTE — Telephone Encounter (Signed)
Not on current med list, but patient has taken in the past. Please advise.

## 2018-06-07 ENCOUNTER — Ambulatory Visit: Payer: 59 | Admitting: Psychiatry

## 2018-06-17 ENCOUNTER — Ambulatory Visit: Payer: 59 | Admitting: Psychiatry

## 2018-06-17 DIAGNOSIS — F451 Undifferentiated somatoform disorder: Secondary | ICD-10-CM | POA: Insufficient documentation

## 2018-06-24 ENCOUNTER — Ambulatory Visit (INDEPENDENT_AMBULATORY_CARE_PROVIDER_SITE_OTHER): Payer: Self-pay | Admitting: Psychiatry

## 2018-06-24 ENCOUNTER — Encounter: Payer: Self-pay | Admitting: Psychiatry

## 2018-06-24 ENCOUNTER — Other Ambulatory Visit: Payer: Self-pay | Admitting: Psychiatry

## 2018-06-24 ENCOUNTER — Other Ambulatory Visit: Payer: Self-pay | Admitting: Internal Medicine

## 2018-06-24 VITALS — BP 118/84 | HR 92 | Ht 68.5 in | Wt 189.0 lb

## 2018-06-24 DIAGNOSIS — G4733 Obstructive sleep apnea (adult) (pediatric): Secondary | ICD-10-CM

## 2018-06-24 DIAGNOSIS — Z9989 Dependence on other enabling machines and devices: Secondary | ICD-10-CM

## 2018-06-24 DIAGNOSIS — F3131 Bipolar disorder, current episode depressed, mild: Secondary | ICD-10-CM

## 2018-06-24 DIAGNOSIS — F451 Undifferentiated somatoform disorder: Secondary | ICD-10-CM

## 2018-06-24 DIAGNOSIS — F4010 Social phobia, unspecified: Secondary | ICD-10-CM

## 2018-06-24 MED ORDER — QUETIAPINE FUMARATE 400 MG PO TABS: 800.0000 mg | ORAL_TABLET | Freq: Every evening | ORAL | 1 refills | Status: DC

## 2018-06-24 MED ORDER — LAMOTRIGINE 150 MG PO TABS: 300.0000 mg | ORAL_TABLET | Freq: Every day | ORAL | 1 refills | Status: DC

## 2018-06-24 MED ORDER — ZOLPIDEM TARTRATE 10 MG PO TABS: 10.0000 mg | ORAL_TABLET | Freq: Every evening | ORAL | 1 refills | Status: DC | PRN

## 2018-06-24 MED ORDER — CLONIDINE HCL 0.1 MG PO TABS: 0.1000 mg | ORAL_TABLET | Freq: Every evening | ORAL | 1 refills | Status: DC

## 2018-06-24 MED ORDER — CHLORDIAZEPOXIDE HCL 25 MG PO CAPS: 25.0000 mg | ORAL_CAPSULE | Freq: Two times a day (BID) | ORAL | 1 refills | Status: DC

## 2018-06-24 MED ORDER — ALPRAZOLAM 1 MG PO TABS: 1.0000 mg | ORAL_TABLET | Freq: Three times a day (TID) | ORAL | 1 refills | Status: DC | PRN

## 2018-06-24 MED ORDER — PAROXETINE HCL 20 MG PO TABS: 20.0000 mg | ORAL_TABLET | Freq: Every evening | ORAL | 1 refills | Status: DC

## 2018-06-24 NOTE — Progress Notes (Signed)
[facility]  Patient ID: Sean Wu,  MRN: [id number]  PCP: Marletta Lor, MD  Date of Evaluation: 06/24/2018 Time spent:20 minutes   HISTORY/CURRENT STATUS: Sean Wu is seen individually face-to-face with consent not collateral for psychiatric interview and exam in 6-week evaluation and management of bipolar and social anxiety insomnia exacerbating sleep apnea with somatic symptom comorbidities. At his last appointment, there was consideration of adding Remeron to his increased Seroquel doubled at 800 mg IR nightly as patient was more depressed and sleep deprived.  In the interim, he did improve his sleep though being somewhat somnolent in the day while completing the cruise required with his mother during which wife had a good time but all he could do was eat then dread gastroparesis and diarrhea, hoping to get back as quick as possible.  Mother then stayed 3 more days after the trip ended and then another friend visited.  The patient returned from his cruise with sinobronchitis after also having symptoms of diarrhea from his gastro paresis during the cruise when he was eating too much.  Though he has cough and congestion currently and must limit his time in the gym until not contagious, he out of some medications missing an appointment here in the interim and pharmacy now notifying him he has low supply.  In the switch over to the electronic medical record here, medications will be updated though with hope that he will reduce Seroquel further to no more than 1 pill at a 400 mg daily nightly, though currently on 1-1/2.  Anxiety  Presents for follow-up visit. Symptoms include chest pain, depressed mood, dry mouth, excessive worry, feeling of choking, insomnia, nausea, nervous/anxious behavior and panic. Patient reports no compulsions, confusion, decreased concentration, dizziness, hyperventilation, impotence, irritability, malaise, muscle tension, obsessions, palpitations,  restlessness, shortness of breath or suicidal ideas. Symptoms occur most days. The severity of symptoms is causing significant distress and moderate. The patient sleeps 5 hours per night. The quality of sleep is fair. Nighttime awakenings: one to two.   Compliance with medications is 76-100%. Side effects of treatment include GI discomfort and headaches.    Individual Medical History/ Review of Systems: Changes? :Yes past medications have included Depakote, Revia, Rozerem, Saphris, Cymbalta, and allergy to Topamax,  Allergies: Topamax [topiramate]  Current Medications:  Current Outpatient Medications:  .  ALPRAZolam (XANAX) 1 MG tablet, Take 1 tablet (1 mg total) by mouth 3 (three) times daily as needed., Disp: 270 tablet, Rfl: 1 .  chlordiazePOXIDE (LIBRIUM) 25 MG capsule, Take 1 capsule (25 mg total) by mouth 2 (two) times daily., Disp: 180 capsule, Rfl: 1 .  cloNIDine (CATAPRES) 0.1 MG tablet, Take 1 tablet (0.1 mg total) by mouth Nightly., Disp: 90 tablet, Rfl: 1 .  lamoTRIgine (LAMICTAL) 150 MG tablet, Take 2 tablets (300 mg total) by mouth daily., Disp: 180 tablet, Rfl: 1 .  linaclotide (LINZESS) 290 MCG CAPS capsule, Take 1 capsule (290 mcg total) by mouth daily. NO MORE REFILLS UNTIL APPOINTMENT WITH DR JACOBS, Disp: 30 capsule, Rfl: 0 .  pantoprazole (PROTONIX) 40 MG tablet, TAKE 1 TABLET TWICE A DAY, Disp: 60 tablet, Rfl: 6 .  PARoxetine (PAXIL) 20 MG tablet, Take 1 tablet (20 mg total) by mouth Nightly., Disp: 90 tablet, Rfl: 1 .  prochlorperazine (COMPAZINE) 10 MG tablet, TAKE 1 TABLET (10 MG TOTAL) BY MOUTH EVERY 6 (SIX) HOURS AS NEEDED., Disp: 30 tablet, Rfl: 2 .  QUEtiapine (SEROQUEL) 400 MG tablet, Take 2 tablets (800 mg total) by  mouth Nightly., Disp: 180 tablet, Rfl: 1 .  sildenafil (VIAGRA) 100 MG tablet, TAKE 1 TABLET BY MOUTH EVERY DAY FOR ERECTILE DYSFUNCTION, Disp: 6 tablet, Rfl: 0 .  zolpidem (AMBIEN) 10 MG tablet, Take 1 tablet (10 mg total) by mouth at bedtime as needed  for sleep., Disp: 90 tablet, Rfl: 1 Medication Side Effects: Headache and Abdominal Pain  Family Medical/ Social History: Changes? Yes, mother and maternal grandmother have anxiety in character based conflicts of communication and relations.  MENTAL HEALTH EXAM:  Blood pressure 118/84, pulse 92, height 5' 8.5" (1.74 m), weight 189 lb (85.7 kg).Body mass index is 28.32 kg/m.  General Appearance: Casual, Fairly Groomed and Guarded  Eye Contact:  Good to fair  Speech:  Clear and Coherent  Volume:  Normal  Mood:  Anxious, Dysphoric, Irritable and Worthless  Affect:  Labile  Thought Process:  Coherent and Goal Directed  Orientation:  Full (Time, Place, and Person)  Thought Content: Obsessions, Paranoid Ideation and Rumination   Suicidal Thoughts:  No  Homicidal Thoughts:  No  Memory:  Remote  Judgement:  Fair  Insight:  Fair  Psychomotor Activity:  Increased  Concentration:  Concentration: Fair and Attention Span: Good  Recall:  Good  Fund of Knowledge: Fair  Language: Fair  Akathisia:  No  AIMS (if indicated): done = 0, postural reflexes 0/0, muscle strength 5/5  Assets:  Desire for Improvement Housing Talents/Skills Transportation  ADL's:  Intact  Cognition: WNL  Prognosis:  Fair    DIAGNOSES:    ICD-10-CM   1. Bipolar I disorder, mild, current or most recent episode depressed, with mixed features (HCC) F31.31 PARoxetine (PAXIL) 20 MG tablet    zolpidem (AMBIEN) 10 MG tablet    QUEtiapine (SEROQUEL) 400 MG tablet    lamoTRIgine (LAMICTAL) 150 MG tablet    ALPRAZolam (XANAX) 1 MG tablet    chlordiazePOXIDE (LIBRIUM) 25 MG capsule  2. Social anxiety disorder F40.10 PARoxetine (PAXIL) 20 MG tablet    zolpidem (AMBIEN) 10 MG tablet    ALPRAZolam (XANAX) 1 MG tablet    chlordiazePOXIDE (LIBRIUM) 25 MG capsule  3. Somatic symptom disorder F45.1 zolpidem (AMBIEN) 10 MG tablet    cloNIDine (CATAPRES) 0.1 MG tablet    chlordiazePOXIDE (LIBRIUM) 25 MG capsule  4. OSA on CPAP  G47.33 zolpidem (AMBIEN) 10 MG tablet   Z99.89     RECOMMENDATIONS: Patient has reduced Seroquel 400 mg IR to 1-1/2 tablets nightly with goal being to get down to 1 nightly soon, and Remeron is not necessary.  He has several other medications to help with insomnia including clonidine zolpidem, and Librium though he staggers the medication for tolerance and is careful not to sleep excessively at night on his CPAP.  He anticipates quiet time with wife at home without visitors for several months during which he can reestablish homeostasis.  He uses Xanax for panic from his social anxiety but is taking Librium on a scheduled dose for prevention of anxiety and mood swings since resolution of his alcohol dependence.  He has stopped cigarettes and alcohol, using no intoxicants now.  He is not attending therapy any longer nor AA.  Medication regimens are reestablished at [facility] [address] 90-day supply and 1 refill each of Seroquel 400 mg IR at most 2 nightly, Lamictal 150 mg taking 2 nightly, Paxil 20 mg nightly, Ambien 10 mg nightly as needed, Librium 25 mg twice daily, Xanax 1 mg 3 times daily as needed, and clonidine 0.1 mg nightly as  needed for insomnia.  He returns in 3 months as his acute decompensation last appointment has stabilized.    Delight Hoh, MD

## 2018-07-12 ENCOUNTER — Telehealth: Payer: Self-pay

## 2018-07-21 NOTE — Telephone Encounter (Signed)
Pt medication refilled  

## 2018-09-03 ENCOUNTER — Other Ambulatory Visit: Payer: Self-pay | Admitting: Psychiatry

## 2018-11-02 ENCOUNTER — Encounter: Payer: Self-pay | Admitting: Psychiatry

## 2018-11-02 ENCOUNTER — Ambulatory Visit (INDEPENDENT_AMBULATORY_CARE_PROVIDER_SITE_OTHER): Payer: Self-pay | Admitting: Psychiatry

## 2018-11-02 VITALS — BP 126/84 | HR 72 | Ht 68.0 in | Wt 192.0 lb

## 2018-11-02 DIAGNOSIS — G4733 Obstructive sleep apnea (adult) (pediatric): Secondary | ICD-10-CM

## 2018-11-02 DIAGNOSIS — F451 Undifferentiated somatoform disorder: Secondary | ICD-10-CM

## 2018-11-02 DIAGNOSIS — F3131 Bipolar disorder, current episode depressed, mild: Secondary | ICD-10-CM

## 2018-11-02 DIAGNOSIS — Z9989 Dependence on other enabling machines and devices: Secondary | ICD-10-CM

## 2018-11-02 DIAGNOSIS — F1021 Alcohol dependence, in remission: Secondary | ICD-10-CM

## 2018-11-02 DIAGNOSIS — F4010 Social phobia, unspecified: Secondary | ICD-10-CM

## 2018-11-02 MED ORDER — ALPRAZOLAM 0.5 MG PO TABS: 0.5000 mg | ORAL_TABLET | Freq: Three times a day (TID) | ORAL | 0 refills | Status: DC | PRN

## 2018-11-02 MED ORDER — CLONIDINE HCL 0.1 MG PO TABS: 0.1000 mg | ORAL_TABLET | Freq: Every day | ORAL | 0 refills | Status: DC

## 2018-11-02 NOTE — Progress Notes (Signed)
[facility]  Patient ID: Sean Wu,  MRN: [id number]  PCP: Marletta Lor, MD  Date of Evaluation: 11/02/2018 Time spent:20 minutes  Chief Complaint:  Chief Complaint    Depression; Anxiety; Manic Behavior      HISTORY/CURRENT STATUS: Sean Wu is seen individually face-to-face with consent with epic collateral for psychiatric interview and exam in 10-monthevaluation and management of bipolar 1 depressed mixed, social anxiety, somatic symptom disorder, obstructive sleep apnea, and alcohol use disorder in remission.  As of last appointment, he increased Seroquel to 800 mg nightly for sustained insomnia with all other medications failing to effect sleep for patient fuctioning the next day.  He has no residual sedation or unusual fatigue.  No new medications are underway but he lost his clonidine and Xanax and is therefore not sleeping or relieving social panic.  He has successfully reduced Seroquel using previous 300 mg tablets taking 1-1/2 tablets total 450 mg achieving adequate sleep, mood, and agitation. He is willing and able now to reduce further to 400 mg IR nightly.  He continues working out in gym as home renovations are underway difficult for the cat who lives with him.  His wife is not worried about any of his symptoms now stating that he is doing good as usual but is out of two of his medications. Depression       The patient presents with depression.  This is a recurrent problem.  The current episode started more than 1 month ago.   The onset quality is sudden.   The problem occurs intermittently.  The problem has been waxing and waning since onset.  Associated symptoms include fatigue, insomnia, irritable, decreased interest, appetite change, headaches, indigestion and sad.  Associated symptoms include no decreased concentration, no helplessness, no hopelessness and no suicidal ideas.     The symptoms are aggravated by social issues and family issues.  Past  treatments include SSRIs - Selective serotonin reuptake inhibitors, SNRIs - Serotonin and norepinephrine reuptake inhibitors and other medications.  Compliance with treatment is good.  Past compliance problems include medical issues, difficulty with treatment plan and medication issues.  Previous treatment provided moderate relief.  Risk factors include a change in medication usage/dosage, family history, family history of mental illness, history of mental illness, major life event, substance abuse and stress.   Past medical history includes chronic illness, anxiety, bipolar disorder, depression, mental health disorder and head trauma.     Pertinent negatives include no recent illness, no life-threatening condition, no recent psychiatric admission, no eating disorder, no obsessive-compulsive disorder, no post-traumatic stress disorder, no schizophrenia and no suicide attempts.   Individual Medical History/ Review of Systems: Changes? :Yes  Epic documents 03/31/2017 appointment with PCP regarding 2 spells at home thrashing in bed knocking over bedside tabletop contents better with Xanax lasting 60 minutes the first night then 10 minutes the next having no postictal symptoms. Though neuro appointment was arranged, the patient did not keep that appointment with Dr. PLeta Baptistfor PCP conclusion of nonepileptic seizure.  UDS at that time was reviewed by PCP at next appointment in 2 months documenting that cocaine was from a 1 night event at neighborhood party with no other use.  Patient considered 6 years ago having delirium symptoms when suddenly stopping his benzodiazepines at the time with none since.  Though he suggests fairly infrequent use, he appears to be taking the Librium now 50 mg nightly and Xanax as needed but using usually 3 daily according to refills  by [facility] registry of 270 Xanax on 06/24/2017 and 09/19/2018.  He did experience theft or other loss of his alprazolam and clonidine while in [place] on vacation  recently.  PCP stopped his tramadol for headache after the thrashing spell.   Ref Range & Units 52yrago  Sodium 135 - 145 mEq/L 136   Potassium 3.5 - 5.1 mEq/L 4.5   Chloride 96 - 112 mEq/L 100   CO2 19 - 32 mEq/L 30   Glucose, Bld 70 - 99 mg/dL 100High    BUN 6 - 23 mg/dL 10   Creatinine, Ser 0.40 - 1.50 mg/dL 0.90   Total Bilirubin 0.2 - 1.2 mg/dL 0.4   Alkaline Phosphatase 39 - 117 U/L 81   AST 0 - 37 U/L 10   ALT 0 - 53 U/L 9   Total Protein 6.0 - 8.3 g/dL 7.0   Albumin 3.5 - 5.2 g/dL 4.0   Calcium 8.4 - 10.5 mg/dL 9.4   GFR >60.00 mL/min 95.60   Resulting Agency  [place] [facility]      Specimen Collected: 03/31/17 12:04  Last Resulted: 03/31/17 14:22      Ref Range & Units 172yrgo  TSH 0.35 - 4.50 uIU/mL 2.47   Resulting Agency  [place] [facility]      Specimen Collected: 03/31/17 12:04 Last Resulted: 03/31/17 14:47        AMPHETAMINES (1000 ng/mL SCRN) NEGATIVE   BARBITURATES NEGATIVE   BENZODIAZEPINES POSITIVEAbnormal    COCAINE METABOLITES POSITIVEAbnormal    MARIJUANA MET (50 ng/mL SCRN) NEGATIVE   METHADONE NEGATIVE   METHAQUALONE NEGATIVE   OPIATES NEGATIVE   PHENCYCLIDINE NEGATIVE   PROPOXYPHENE NEGATIVE     Allergies: Topamax [topiramate]  Current Medications:  Current Outpatient Medications:  .  ALPRAZolam (XANAX) 0.5 MG tablet, Take 1 tablet (0.5 mg total) by mouth 3 (three) times daily as needed for anxiety., Disp: 270 tablet, Rfl: 0 .  chlordiazePOXIDE (LIBRIUM) 25 MG capsule, Take 1 capsule (25 mg total) by mouth 2 (two) times daily., Disp: 180 capsule, Rfl: 1 .  cloNIDine (CATAPRES) 0.1 MG tablet, Take 1 tablet (0.1 mg total) by mouth at bedtime., Disp: 90 tablet, Rfl: 0 .  lamoTRIgine (LAMICTAL) 150 MG tablet, Take 2 tablets (300 mg total) by mouth daily., Disp: 180 tablet, Rfl: 1 .  linaclotide (LINZESS) 290 MCG CAPS capsule, Take 1 capsule (290 mcg total) by mouth daily. NO MORE REFILLS UNTIL APPOINTMENT WITH DR JACOBS, Disp: 30 capsule, Rfl:  0 .  pantoprazole (PROTONIX) 40 MG tablet, TAKE 1 TABLET TWICE A DAY, Disp: 60 tablet, Rfl: 6 .  PARoxetine (PAXIL) 20 MG tablet, Take 1 tablet (20 mg total) by mouth Nightly., Disp: 90 tablet, Rfl: 1 .  prochlorperazine (COMPAZINE) 10 MG tablet, TAKE 1 TABLET (10 MG TOTAL) BY MOUTH EVERY 6 (SIX) HOURS AS NEEDED., Disp: 30 tablet, Rfl: 2 .  QUEtiapine (SEROQUEL) 400 MG tablet, Take 2 tablets (800 mg total) by mouth Nightly., Disp: 180 tablet, Rfl: 1 .  sildenafil (VIAGRA) 100 MG tablet, TAKE 1 TABLET BY MOUTH EVERY DAY FOR ERECTILE DYSFUNCTION, Disp: 6 tablet, Rfl: 0 .  zolpidem (AMBIEN) 10 MG tablet, Take 1 tablet (10 mg total) by mouth at bedtime as needed for sleep., Disp: 90 tablet, Rfl: 1   Medication Side Effects: none  Family Medical/ Social History: Changes? Yes wife works full-time and expects him to accept inability to work to stay at home to care for everything.  He continues to talk to mother  daily next though seeing her infrequently now since their trip to [place].   MENTAL HEALTH EXAM:   Muscle strengths and tone 5/5, postural reflexes and gait 0/0, and AIMS = 0. Blood pressure 126/84, pulse 72, height _0  (1.727 m), weight 192 lb (87.1 kg).Body mass index is 29.19 kg/m.  General Appearance: Casual, Fairly Groomed and Guarded  Eye Contact:  Good  Speech:  Clear and Coherent and Normal Rate  Volume:  Normal  Mood:  Anxious, Dysphoric, Euthymic, Irritable and Worthless  Affect:  Depressed, Inappropriate, Full Range and Anxious  Thought Process:  Goal Directed, Irrelevant and Linear  Orientation:  Full (Time, Place, and Person)  Thought Content: Ilusions, Obsessions, Paranoid Ideation and Rumination   Suicidal Thoughts:  No  Homicidal Thoughts:  No  Memory:  Immediate;   Fair Remote;   Good  Judgement:  Fair  Insight:  Fair  Psychomotor Activity:  Normal, Decreased and Mannerisms  Concentration:  Concentration: Fair and Attention Span: Good  Recall:  Good  Fund of  Knowledge: Fair  Language: Good  Assets:  Resilience Social Support Talents/Skills  ADL's:  Intact  Cognition: within normal limits  Prognosis:  Fair    DIAGNOSES:    ICD-10-CM   1. Bipolar I disorder, mild, current or most recent episode depressed, with mixed features (HCC) F31.31 ALPRAZolam (XANAX) 0.5 MG tablet  2. Social anxiety disorder F40.10 ALPRAZolam (XANAX) 0.5 MG tablet  3. Somatic symptom disorder F45.1 cloNIDine (CATAPRES) 0.1 MG tablet  4. OSA on CPAP G47.33    Z99.89   5. Alcohol use disorder, severe, in sustained remission ([place]) F10.21     Receiving Psychotherapy: No    RECOMMENDATIONS: Librium is continued 25 mg taking 2 at bedtime current supply for baseline anxiety and insomnia associated with the hall use disorder and current remission.  Xanax is reduced to 0.5 mg 3 times daily as needed for social and panic anxiety #270 with [facility] [place]  Clonidine is renewed 0.1 mg nightly at bedtime sent as a 90-day supply and no refill [facility] [address].  Seroquel is reduced to 400 mg IR nightly having current supply for bipolar and social anxiety.  He continues Lamictal 150 mg taking 2 tablets every bedtime current supply for bipolar and social anxiety.  He continues Paxil 20 mg every bedtime having current supply for social anxiety and bipolar depression.  He has current supply of Ambien 10 mg nightly needed for insomnia for bipolar and social anxiety.  He returns in 3 months.   Delight Hoh, MD

## 2018-11-08 ENCOUNTER — Other Ambulatory Visit: Payer: Self-pay | Admitting: Internal Medicine

## 2018-12-17 ENCOUNTER — Other Ambulatory Visit: Payer: Self-pay | Admitting: Psychiatry

## 2018-12-17 DIAGNOSIS — F3131 Bipolar disorder, current episode depressed, mild: Secondary | ICD-10-CM

## 2018-12-17 DIAGNOSIS — F4010 Social phobia, unspecified: Secondary | ICD-10-CM

## 2018-12-17 DIAGNOSIS — F451 Undifferentiated somatoform disorder: Secondary | ICD-10-CM

## 2018-12-17 DIAGNOSIS — G4733 Obstructive sleep apnea (adult) (pediatric): Secondary | ICD-10-CM

## 2018-12-17 DIAGNOSIS — Z9989 Dependence on other enabling machines and devices: Secondary | ICD-10-CM

## 2018-12-24 ENCOUNTER — Other Ambulatory Visit: Payer: Self-pay | Admitting: Psychiatry

## 2018-12-24 DIAGNOSIS — F3131 Bipolar disorder, current episode depressed, mild: Secondary | ICD-10-CM

## 2019-01-05 ENCOUNTER — Telehealth: Payer: Self-pay | Admitting: Psychiatry

## 2019-01-05 DIAGNOSIS — F4010 Social phobia, unspecified: Secondary | ICD-10-CM

## 2019-01-05 DIAGNOSIS — F3131 Bipolar disorder, current episode depressed, mild: Secondary | ICD-10-CM

## 2019-01-05 MED ORDER — ALPRAZOLAM 1 MG PO TABS: 0.5000 mg | ORAL_TABLET | Freq: Two times a day (BID) | ORAL | 0 refills | Status: DC | PRN

## 2019-01-05 NOTE — Telephone Encounter (Signed)
At last appointment 11/02/2018, patient concurred to reduce Xanax to 0.5 mg 3 times daily as needed already taking the Librium twice daily on a scheduled basis.  However he now phones back 4 weeks early requesting refill of the previous Xanax 1 mg as though possibly exhausting the 90-day Xanax 0.5 mg apply in 60 days.  In between dose of Xanax 1 mg twice daily as needed #60 with no refill is [facility] [address] titrating for the optimal but lowest effective dose when he day prescriptions do not facilitate such clarification and titration.

## 2019-01-05 NOTE — Telephone Encounter (Signed)
Patient called and said that he needs a refill on his xanax 01 mg to [facility] [address]

## 2019-01-15 DIAGNOSIS — H6123 Impacted cerumen, bilateral: Secondary | ICD-10-CM | POA: Diagnosis not present

## 2019-01-19 ENCOUNTER — Other Ambulatory Visit: Payer: Self-pay | Admitting: Psychiatry

## 2019-01-19 DIAGNOSIS — F451 Undifferentiated somatoform disorder: Secondary | ICD-10-CM

## 2019-01-19 DIAGNOSIS — F4010 Social phobia, unspecified: Secondary | ICD-10-CM

## 2019-01-19 DIAGNOSIS — F3131 Bipolar disorder, current episode depressed, mild: Secondary | ICD-10-CM

## 2019-01-20 NOTE — Telephone Encounter (Signed)
Next appt 06/03

## 2019-01-20 NOTE — Telephone Encounter (Signed)
Last office visit 11/02/2018 with next 02/02/2019 having remaining supply for Librium 25 mg twice daily for anxiety and insomnia persistent from alcohol use disorder to send update eScription #180 no refill after both fills of 06/25/2019 are exhausted [facility] [address] as [place] registry okay.

## 2019-01-28 ENCOUNTER — Telehealth: Payer: Self-pay | Admitting: Internal Medicine

## 2019-01-28 NOTE — Telephone Encounter (Signed)
Left message on machine for patient to schedule a TOC appointment for further refills.  Medication denied.

## 2019-01-28 NOTE — Telephone Encounter (Signed)
Copied from [place] [phone]. Topic: Quick Communication - Rx Refill/Question >> Jan 28, 2019  3:41 PM Keene Breath wrote: Medication: prochlorperazine (COMPAZINE) 10 MG tablet  Patient called to request a refill for the above medication  Preferred Pharmacy (with phone number or street name): [id number] [place], [address] [address] AT [address] [phone] (Phone) [phone] (Fax)

## 2019-02-02 ENCOUNTER — Ambulatory Visit: Payer: Self-pay | Admitting: Psychiatry

## 2019-02-11 ENCOUNTER — Other Ambulatory Visit: Payer: Self-pay | Admitting: Psychiatry

## 2019-02-11 DIAGNOSIS — F3131 Bipolar disorder, current episode depressed, mild: Secondary | ICD-10-CM

## 2019-02-14 ENCOUNTER — Other Ambulatory Visit: Payer: Self-pay

## 2019-02-14 ENCOUNTER — Ambulatory Visit: Payer: BC Managed Care – PPO | Admitting: Psychiatry

## 2019-02-14 ENCOUNTER — Encounter: Payer: Self-pay | Admitting: Psychiatry

## 2019-02-14 VITALS — Ht 69.0 in | Wt 192.0 lb

## 2019-02-14 DIAGNOSIS — F3131 Bipolar disorder, current episode depressed, mild: Secondary | ICD-10-CM | POA: Diagnosis not present

## 2019-02-14 DIAGNOSIS — Z9989 Dependence on other enabling machines and devices: Secondary | ICD-10-CM

## 2019-02-14 DIAGNOSIS — F4010 Social phobia, unspecified: Secondary | ICD-10-CM

## 2019-02-14 DIAGNOSIS — G4733 Obstructive sleep apnea (adult) (pediatric): Secondary | ICD-10-CM

## 2019-02-14 DIAGNOSIS — F410 Panic disorder [episodic paroxysmal anxiety] without agoraphobia: Secondary | ICD-10-CM | POA: Diagnosis not present

## 2019-02-14 DIAGNOSIS — F1021 Alcohol dependence, in remission: Secondary | ICD-10-CM

## 2019-02-14 DIAGNOSIS — F451 Undifferentiated somatoform disorder: Secondary | ICD-10-CM | POA: Diagnosis not present

## 2019-02-14 MED ORDER — QUETIAPINE FUMARATE 300 MG PO TABS: 300.0000 mg | ORAL_TABLET | Freq: Every day | ORAL | 0 refills | Status: DC

## 2019-02-14 MED ORDER — ZOLPIDEM TARTRATE 10 MG PO TABS: 10.0000 mg | ORAL_TABLET | Freq: Every day | ORAL | 0 refills | Status: DC

## 2019-02-14 MED ORDER — ALPRAZOLAM 1 MG PO TABS: 1.0000 mg | ORAL_TABLET | Freq: Three times a day (TID) | ORAL | 0 refills | Status: DC | PRN

## 2019-02-14 MED ORDER — CLONIDINE HCL 0.1 MG PO TABS: 0.1000 mg | ORAL_TABLET | Freq: Every day | ORAL | 0 refills | Status: DC

## 2019-02-14 MED ORDER — LAMOTRIGINE 150 MG PO TABS: 300.0000 mg | ORAL_TABLET | Freq: Every day | ORAL | 1 refills | Status: DC

## 2019-02-14 MED ORDER — PAROXETINE HCL 20 MG PO TABS: 20.0000 mg | ORAL_TABLET | Freq: Every evening | ORAL | 0 refills | Status: DC

## 2019-02-14 NOTE — Progress Notes (Signed)
[facility]  Patient ID: Sean Wu,  MRN: [id number]  PCP: Marletta Lor, MD  Date of Evaluation: 02/14/2019 Time spent:20 minutes from 1125 to 1145  Chief Complaint:  Chief Complaint    Panic Attack; Anxiety; Depression; Manic Behavior      HISTORY/CURRENT STATUS: Sean Wu is seen individually face-to-face on site in office with consent with epic collateral for psychiatric interview and exam in 34-month evaluation and management of bipolar depressed, social anxiety, somatic symptom disorder, obstructive sleep apnea, and sobriety from alcohol, cocaine, and tobacco.  He predicts significant weight gain as gyms are closed as his most stressful part of the pandemic.  Overall weight is stable at 192 same as 3 months ago he agrees to reduce Seroquel from 400 to 300 mg IR nightly sleep is better and discontinue Librium as best to go up on the strength of and ask for contact such as this morning.  He has renovations underway at the family home where he always spends his day while wife works and he does the cooking.  Mother texts him every day but has not visited in some time.  [place] registry documents Xanax sent in as the 1 mg at his request on 01/05/2019 from the 0.5 which he prefers to continue and stop the Librium.  He thereby reviews all levels of relationships, responsibilities, and opportunities for therapeutic change which she currently refuses other than increasing Xanax and decreasing Seroquel..  Has no current side effects reviews all options and opportunities.  He has no suicidality, mania, ptosis or dissociation.  Depression The patient presents with depression a recurrent problem for which current episode started more than 1 month ago.   The onset quality is sudden.   The problem occurs intermittently.  The problem has been waxing and waning since onset.  Associated symptoms include fatigue, insomnia, irritable, decreased interest, appetite change, headaches, indigestion  and sad.  Associated symptoms include no decreased concentration, no helplessness, no hopelessness and no suicidal ideas.     The symptoms are aggravated by social issues and family issues.  Past treatments include SSRIs - Selective serotonin reuptake inhibitors, SNRIs - Serotonin and norepinephrine reuptake inhibitors and other medications.  Compliance with treatment is good.  Past compliance problems include medical issues, difficulty with treatment plan and medication issues.  Previous treatment provided moderate relief.  Risk factors include a change in medication usage/dosage, family history, family history of mental illness, history of mental illness, major life event, substance abuse and stress.   Past medical history includes chronic illness, anxiety, bipolar disorder, depression, mental health disorder and head trauma.     Pertinent negatives include no recent illness, no life-threatening condition, no recent psychiatric admission, no eating disorder, no obsessive-compulsive disorder, no post-traumatic stress disorder, no schizophrenia and no suicide attempts.  Individual Medical History/ Review of Systems: Changes? :No Gastroparesis, GERD, testosterone deficiency, migraine, and remote cerebral concussion are wise unchanged  Allergies: Topamax [topiramate]  Current Medications:  Current Outpatient Medications:  .  ALPRAZolam (XANAX) 1 MG tablet, Take 1 tablet (1 mg total) by mouth 3 (three) times daily as needed for anxiety., Disp: 270 tablet, Rfl: 0 .  cloNIDine (CATAPRES) 0.1 MG tablet, Take 1 tablet (0.1 mg total) by mouth at bedtime., Disp: 90 tablet, Rfl: 0 .  lamoTRIgine (LAMICTAL) 150 MG tablet, Take 2 tablets (300 mg total) by mouth at bedtime., Disp: 180 tablet, Rfl: 1 .  linaclotide (LINZESS) 290 MCG CAPS capsule, Take 1 capsule (290 mcg total) by  mouth daily. NO MORE REFILLS UNTIL APPOINTMENT WITH DR JACOBS, Disp: 30 capsule, Rfl: 0 .  pantoprazole (PROTONIX) 40 MG tablet, TAKE 1  TABLET TWICE A DAY, Disp: 60 tablet, Rfl: 6 .  PARoxetine (PAXIL) 20 MG tablet, Take 1 tablet (20 mg total) by mouth Nightly., Disp: 90 tablet, Rfl: 0 .  prochlorperazine (COMPAZINE) 10 MG tablet, TAKE 1 TABLET (10 MG TOTAL) BY MOUTH EVERY 6 (SIX) HOURS AS NEEDED., Disp: 30 tablet, Rfl: 2 .  QUEtiapine (SEROQUEL) 300 MG tablet, Take 1 tablet (300 mg total) by mouth at bedtime., Disp: 90 tablet, Rfl: 0 .  sildenafil (VIAGRA) 100 MG tablet, TAKE 1 TABLET BY MOUTH EVERY DAY FOR ERECTILE DYSFUNCTION, Disp: 6 tablet, Rfl: 0 .  zolpidem (AMBIEN) 10 MG tablet, Take 1 tablet (10 mg total) by mouth at bedtime. for sleep, Disp: 90 tablet, Rfl: 0   Medication Side Effects: none  Family Medical/ Social History: Changes No patient is overwhelmed with renovations process becoming extended at the family residence while he is home and wife is working  MENTAL HEALTH EXAM:  Height 5\' 9"  (1.753 m), weight 192 lb (87.1 kg).Body mass index is 28.35 kg/m.  Others deferred as nonessential in coronavirus pandemic  General Appearance: Casual, Fairly Groomed and Guarded  Eye Contact:  Minimal to fair  Speech:  Clear and Coherent, Garbled and Normal Rate  Volume:  Normal  Mood:  Anxious, Dysphoric, Euthymic, Irritable and Worthless  Affect:  Congruent, Restricted and Anxious  Thought Process:  Disorganized, Goal Directed and Linear  Orientation:  Full (Time, Place, and Person)  Thought Content: Ilusions, Obsessions, Paranoid Ideation and Rumination   Suicidal Thoughts:  No  Homicidal Thoughts:  No  Memory:  Immediate;   Good Remote;   Fair  Judgement:  Impaired to fair  Insight:  Fair  Psychomotor Activity:  Increased, Mannerisms and Restlessness  Concentration:  Concentration: Fair and Attention Span: Poor  Recall:  [employer] of Knowledge: Fair  Language: Fair  Assets:  Leisure Time Resilience Social Support Talents/Skills  ADL's:  Intact  Cognition: WNL  Prognosis:  Fair    DIAGNOSES:     ICD-10-CM   1. Bipolar I disorder, mild, current or most recent episode depressed, with mixed features (HCC)  F31.31 QUEtiapine (SEROQUEL) 300 MG tablet    zolpidem (AMBIEN) 10 MG tablet    ALPRAZolam (XANAX) 1 MG tablet    PARoxetine (PAXIL) 20 MG tablet    lamoTRIgine (LAMICTAL) 150 MG tablet  2. Social anxiety disorder  F40.10 zolpidem (AMBIEN) 10 MG tablet    ALPRAZolam (XANAX) 1 MG tablet    PARoxetine (PAXIL) 20 MG tablet  3. Panic disorder  F41.0   4. Somatic symptom disorder  F45.1 zolpidem (AMBIEN) 10 MG tablet    cloNIDine (CATAPRES) 0.1 MG tablet  5. Alcohol use disorder, severe, in sustained remission ([place])  F10.21   6. OSA on CPAP  G47.33 zolpidem (AMBIEN) 10 MG tablet   Z99.89     Receiving Psychotherapy: No    RECOMMENDATIONS: Xanax E scription is updated to 1 mg 3 times daily as needed for panic anxiety sent as #270 with [facility] [address] for panic and social anxiety.  Librium is discontinued as 50 mg twice daily longer needed relative to alcoholism. Seroquel is reduced from 400 to 300 mg IR tablet nightly sent as #90 with no refill for Bipolar disorder and panic [facility] [place].  Renewed sent by E scription  to [facility] [address] for 10 mg every bedtime as #90 with no refill.  Paxil is sent as 20 mg every evening #90 with no refill [facility] [address] for bipolar depression and social and panic anxiety.  Lamictal 150 mg tablet take 2 every bedtime for total of 300 mg is sent as #180 with 1 refill to [facility] [address] for bipolar.  Clonidine is sent as 0.1 mg every bedtime 90 with no refill to [facility] [address].  The increase of Xanax is compensated by stopping Librium as Seroquel is reduced.  He awaits opening of the gym returns in 3 months.  Delight Hoh, MD

## 2019-03-24 ENCOUNTER — Ambulatory Visit (INDEPENDENT_AMBULATORY_CARE_PROVIDER_SITE_OTHER): Payer: BC Managed Care – PPO

## 2019-03-24 ENCOUNTER — Ambulatory Visit (HOSPITAL_COMMUNITY)
Admission: EM | Admit: 2019-03-24 | Discharge: 2019-03-24 | Disposition: A | Discharge status: Home / Self Care | Payer: BC Managed Care – PPO | Attending: Emergency Medicine | Admitting: Emergency Medicine

## 2019-03-24 ENCOUNTER — Ambulatory Visit (INDEPENDENT_AMBULATORY_CARE_PROVIDER_SITE_OTHER): Payer: BC Managed Care – PPO | Admitting: Family Medicine

## 2019-03-24 ENCOUNTER — Other Ambulatory Visit: Payer: Self-pay

## 2019-03-24 ENCOUNTER — Encounter (HOSPITAL_COMMUNITY): Payer: Self-pay | Admitting: Emergency Medicine

## 2019-03-24 DIAGNOSIS — Z20828 Contact with and (suspected) exposure to other viral communicable diseases: Secondary | ICD-10-CM | POA: Diagnosis not present

## 2019-03-24 DIAGNOSIS — R197 Diarrhea, unspecified: Secondary | ICD-10-CM

## 2019-03-24 DIAGNOSIS — R0600 Dyspnea, unspecified: Secondary | ICD-10-CM

## 2019-03-24 DIAGNOSIS — R110 Nausea: Secondary | ICD-10-CM

## 2019-03-24 DIAGNOSIS — R6889 Other general symptoms and signs: Secondary | ICD-10-CM | POA: Insufficient documentation

## 2019-03-24 DIAGNOSIS — R5383 Other fatigue: Secondary | ICD-10-CM | POA: Diagnosis not present

## 2019-03-24 DIAGNOSIS — Z20822 Contact with and (suspected) exposure to covid-19: Secondary | ICD-10-CM

## 2019-03-24 DIAGNOSIS — R05 Cough: Secondary | ICD-10-CM

## 2019-03-24 DIAGNOSIS — R059 Cough, unspecified: Secondary | ICD-10-CM

## 2019-03-24 DIAGNOSIS — M791 Myalgia, unspecified site: Secondary | ICD-10-CM

## 2019-03-24 DIAGNOSIS — R6883 Chills (without fever): Secondary | ICD-10-CM

## 2019-03-24 DIAGNOSIS — R0602 Shortness of breath: Secondary | ICD-10-CM

## 2019-03-24 MED ORDER — ALBUTEROL SULFATE HFA 108 (90 BASE) MCG/ACT IN AERS: 1.0000 | INHALATION_SPRAY | Freq: Four times a day (QID) | RESPIRATORY_TRACT | 0 refills | Status: DC | PRN

## 2019-03-24 MED ORDER — ONDANSETRON 4 MG PO TBDP
ORAL_TABLET | ORAL | Status: AC
  Filled 2019-03-24: qty 2

## 2019-03-24 MED ORDER — ONDANSETRON 8 MG PO TBDP: ORAL_TABLET | ORAL | 0 refills | Status: DC

## 2019-03-24 MED ORDER — AEROCHAMBER PLUS MISC: 2 refills | Status: DC

## 2019-03-24 MED ORDER — ONDANSETRON 4 MG PO TBDP
8.0000 mg | ORAL_TABLET | Freq: Once | ORAL | Status: AC
  Administered 2019-03-24: 8 mg via ORAL

## 2019-03-24 NOTE — ED Triage Notes (Signed)
PT reports fatigue, dizziness, SOB, cough, and nausea for 1 week.

## 2019-03-24 NOTE — ED Provider Notes (Signed)
HPI  SUBJECTIVE:  Sean Wu is a 48 y.o. male who presents with 1 week of nonproductive cough, chills, shortness of breath, fatigue, headache, body aches, nausea, diarrhea.  No fevers.  No loss of sense of smell or taste.  No vomiting.  No tick bite.  States that he has nasal congestion or rhinorrhea at baseline.  It has not changed.  Had an e-visit with his primary care physician today, was told to come in for testing.  There was also a concern for walking pneumonia.  Patient states that he has had this several times before and it does not feel similar to previous episodes of pneumonia.  No antipyretic in the past 4 to 6 hours.  No antibiotics in the past 3 months.  He has been resting without improvement in his symptoms.  Symptoms are worse with exertion.  He has a past medical history of gastroparesis, bipolar disease, smoking, pneumonia.  No history of diabetes, hypertension, asthma, emphysema, COPD, HIV, immunocompromise.  PMD: [facility] at [facility].  Past Medical History:  Diagnosis Date  . ALLERGIC RHINITIS 05/10/2009  . Allergy    anemia  . Anxiety   . Bipolar disorder ([place])    see medications patient to bring  . Chronic headaches   . DDD (degenerative disc disease)   . DEPRESSION 05/10/2009  . Gastroparesis   . GERD 05/10/2009  . LIBIDO, DECREASED 05/10/2009  . Neuromuscular disorder ([place])    pt. denies 08/01/16  . Osgood-Schlatter's disease   . SLEEP APNEA, OBSTRUCTIVE 06/12/2009   CPAP not utilized after 30 lb. weight loss  . SMOKER 06/12/2009  . TESTICULAR HYPOFUNCTION 06/12/2009  . WEIGHT GAIN 05/10/2009    Past Surgical History:  Procedure Laterality Date  . ANKLE SURGERY Left   . COLONOSCOPY  2014   normal  . EYE SURGERY     eye implant  . FRACTURE SURGERY     surgery on right ankle for ligament torn 2013  . LASIK Bilateral   . SHOULDER ARTHROSCOPY WITH DISTAL CLAVICLE RESECTION Right 02/26/2015   Procedure: SHOULDER DIAGNOSTIC ARTHROSCOPY WITH OPEN  DISTAL CLAVICLE EXCISION;  Surgeon: Tania Ade, MD;  Location: [facility];  Service: Orthopedics;  Laterality: Right;  Right diagnostic arthroscopy, open distal clavical excision    Family History  Problem Relation Age of Onset  . Heart disease Maternal Grandfather   . Irritable bowel syndrome Father   . Alcoholism Father   . Heart attack Father   . Bipolar disorder Father   . Colon cancer Neg Hx   . Rectal cancer Neg Hx   . Stomach cancer Neg Hx     Social History   Tobacco Use  . Smoking status: Former Smoker    Packs/day: 0.50    Years: 15.00    Pack years: 7.50    Types: Cigarettes  . Smokeless tobacco: Former Systems developer    Types: Chew    Quit date: 09/01/1998  . Tobacco comment: tobacco info given 08/01/13  Substance Use Topics  . Alcohol use: Not Currently    Alcohol/week: 1.0 standard drinks    Types: 1 Cans of beer per week    Comment: social, rare  . Drug use: No    No current facility-administered medications for this encounter.   Current Outpatient Medications:  .  lamoTRIgine (LAMICTAL) 150 MG tablet, Take 2 tablets (300 mg total) by mouth at bedtime., Disp: 180 tablet, Rfl: 1 .  linaclotide (LINZESS) 290 MCG CAPS capsule, Take  1 capsule (290 mcg total) by mouth daily. NO MORE REFILLS UNTIL APPOINTMENT WITH DR JACOBS, Disp: 30 capsule, Rfl: 0 .  QUEtiapine (SEROQUEL) 300 MG tablet, Take 1 tablet (300 mg total) by mouth at bedtime., Disp: 90 tablet, Rfl: 0 .  albuterol (VENTOLIN HFA) 108 (90 Base) MCG/ACT inhaler, Inhale 1-2 puffs into the lungs every 6 (six) hours as needed for wheezing or shortness of breath., Disp: 18 g, Rfl: 0 .  ALPRAZolam (XANAX) 1 MG tablet, Take 1 tablet (1 mg total) by mouth 3 (three) times daily as needed for anxiety., Disp: 270 tablet, Rfl: 0 .  cloNIDine (CATAPRES) 0.1 MG tablet, Take 1 tablet (0.1 mg total) by mouth at bedtime., Disp: 90 tablet, Rfl: 0 .  ondansetron (ZOFRAN ODT) 8 MG disintegrating tablet, 1/2- 1 tablet  q 8 hr prn nausea, vomiting, Disp: 20 tablet, Rfl: 0 .  pantoprazole (PROTONIX) 40 MG tablet, TAKE 1 TABLET TWICE A DAY, Disp: 60 tablet, Rfl: 6 .  prochlorperazine (COMPAZINE) 10 MG tablet, TAKE 1 TABLET (10 MG TOTAL) BY MOUTH EVERY 6 (SIX) HOURS AS NEEDED., Disp: 30 tablet, Rfl: 2 .  sildenafil (VIAGRA) 100 MG tablet, TAKE 1 TABLET BY MOUTH EVERY DAY FOR ERECTILE DYSFUNCTION, Disp: 6 tablet, Rfl: 0 .  Spacer/Aero-Holding Chambers (AEROCHAMBER PLUS) inhaler, Use as instructed, Disp: 1 each, Rfl: 2 .  zolpidem (AMBIEN) 10 MG tablet, Take 1 tablet (10 mg total) by mouth at bedtime. for sleep, Disp: 90 tablet, Rfl: 0  Allergies  Allergen Reactions  . Topamax [Topiramate]     Sleep walking, hallucinations, "I get mean, can't help it"     ROS  As noted in HPI.   Physical Exam  BP 126/77   Pulse (!) 108   Temp 99.2 F (37.3 C) (Oral)   Resp 20   SpO2 97%   Constitutional: Well developed, well nourished, no acute distress Eyes: PERRL, EOMI, conjunctiva normal bilaterally HENT: Normocephalic, atraumatic,mucus membranes moist Respiratory: Clear to auscultation bilaterally, no rales, no wheezing, no rhonchi Cardiovascular: Mild regular tachycardia, no murmurs, no gallops, no rubs GI: Nondistended skin: No rash, skin intact Musculoskeletal: No deformities Neurologic: Alert & oriented x 3, CN II-XII grossly intact, no motor deficits, sensation grossly intact Psychiatric: Speech and behavior appropriate   ED Course   Medications  ondansetron (ZOFRAN-ODT) disintegrating tablet 8 mg (8 mg Oral Given 03/24/19 1912)  ondansetron (ZOFRAN-ODT) 4 MG disintegrating tablet (has no administration in time range)    Orders Placed This Encounter  Procedures  . Novel Coronavirus, NAA (hospital order; send-out to ref lab)    Standing Status:   Standing    Number of Occurrences:   1  . DG Chest 2 View    Standing Status:   Standing    Number of Occurrences:   1    Order Specific Question:    Reason for Exam (SYMPTOM  OR DIAGNOSIS REQUIRED)    Answer:   cough SOB r/o PNA   No results found for this or any previous visit (from the past 24 hour(s)). Dg Chest 2 View  Result Date: 03/24/2019 CLINICAL DATA:  Cough and fever EXAM: CHEST - 2 VIEW COMPARISON:  03/08/2014 FINDINGS: Cardiac shadows within normal limits. The lungs are well aerated bilaterally. Very minimal atelectatic changes are seen. No bony abnormality is seen. IMPRESSION: Mild bibasilar atelectatic changes. Electronically Signed   By: Inez Catalina M.D.   On: 03/24/2019 19:23    ED Clinical Impression  1. Suspected Covid-19 Virus Infection  ED Assessment/Plan   Ordered COVID testing.  Patient slightly tachycardic, otherwise vitals are acceptable.  Will check chest x-ray, but suspect COVID infection.  Giving Zofran here as patient states that he is nauseous.  Sending home with Zofran.  Tylenol, albuterol inhaler with a spacer.  Continue self quarantine until test results are back and symptoms have improved, no fever for 24 hours without antipyretics.  To the ER if he gets worse.  Reviewed imaging independently.  No infiltrate as read by me.  Mild bibasilar atelectatic changes.  See radiology report for details.   Plan as above.  Discussed labs, imaging, MDM, treatment plan, and plan for follow-up with patient Discussed sn/sx that should prompt return to the ED. patient agrees with plan.   Meds ordered this encounter  Medications  . ondansetron (ZOFRAN-ODT) disintegrating tablet 8 mg  . albuterol (VENTOLIN HFA) 108 (90 Base) MCG/ACT inhaler    Sig: Inhale 1-2 puffs into the lungs every 6 (six) hours as needed for wheezing or shortness of breath.    Dispense:  18 g    Refill:  0  . Spacer/Aero-Holding Chambers (AEROCHAMBER PLUS) inhaler    Sig: Use as instructed    Dispense:  1 each    Refill:  2  . ondansetron (ZOFRAN ODT) 8 MG disintegrating tablet    Sig: 1/2- 1 tablet q 8 hr prn nausea, vomiting     Dispense:  20 tablet    Refill:  0    *This clinic note was created using [employer]. Therefore, there may be occasional mistakes despite careful proofreading.  ?   Melynda Ripple, MD 03/24/19 [phone]

## 2019-03-24 NOTE — Discharge Instructions (Signed)
Chest x-ray was negative for pneumonia.  I have decided to send you home on Zofran rather than Reglan as Zofran is much better with nausea than Reglan is.  1 g of Tylenol 3-4 times a day.  1 puff from your albuterol inhaler 3-4 times a day as needed for shortness of breath, coughing.  Stay at home until your results come back, your symptoms improved, and you have been afebrile without Tylenol for 24 hours.

## 2019-03-24 NOTE — Progress Notes (Signed)
Virtual Visit via Video Note  I connected with Sean Wu  on 03/24/19 at  5:20 PM EDT by a video enabled telemedicine application and verified that I am speaking with the correct person using two identifiers.  Location patient: home Location provider:work or home office Persons participating in the virtual visit: patient, provider  I discussed the limitations of evaluation and management by telemedicine and the availability of in person appointments. The patient expressed understanding and agreed to proceed.   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  Past Medical History:  Diagnosis Date  . ALLERGIC RHINITIS 05/10/2009  . Allergy    anemia  . Anxiety   . Bipolar disorder ([place])    see medications patient to bring  . Chronic headaches   . DDD (degenerative disc disease)   . DEPRESSION 05/10/2009  . Gastroparesis   . GERD 05/10/2009  . LIBIDO, DECREASED 05/10/2009  . Neuromuscular disorder ([place])    pt. denies 08/01/16  . Osgood-Schlatter's disease   . SLEEP APNEA, OBSTRUCTIVE 06/12/2009   CPAP not utilized after 30 lb. weight loss  . SMOKER 06/12/2009  . TESTICULAR HYPOFUNCTION 06/12/2009  . WEIGHT GAIN 05/10/2009    Past Surgical History:  Procedure Laterality Date  . ANKLE SURGERY Left   . COLONOSCOPY  2014   normal  . EYE SURGERY     eye implant  . FRACTURE SURGERY     surgery on right ankle for ligament torn 2013  . LASIK Bilateral   . SHOULDER  ARTHROSCOPY WITH DISTAL CLAVICLE RESECTION Right 02/26/2015   Procedure: SHOULDER DIAGNOSTIC ARTHROSCOPY WITH OPEN DISTAL CLAVICLE EXCISION;  Surgeon: Tania Ade, MD;  Location: [place];  Service: Orthopedics;  Laterality: Right;  Right diagnostic arthroscopy, open distal clavical excision    Family History  Problem Relation Age of Onset  . Heart disease Maternal Grandfather   . Irritable bowel syndrome Father   . Alcoholism Father   . Heart attack Father   . Bipolar disorder Father   . Colon cancer Neg Hx   . Rectal cancer Neg Hx   . Stomach cancer Neg Hx     SOCIAL HX: see hpi   Current Outpatient Medications:  .  ALPRAZolam (XANAX) 1 MG tablet, Take 1 tablet (1 mg total) by mouth 3 (three) times daily as needed for anxiety., Disp: 270 tablet, Rfl: 0 .  cloNIDine (CATAPRES) 0.1 MG tablet, Take 1 tablet (0.1 mg total) by mouth at bedtime., Disp: 90 tablet, Rfl: 0 .  lamoTRIgine (LAMICTAL) 150 MG tablet, Take 2 tablets (300 mg total) by mouth at bedtime., Disp: 180 tablet, Rfl: 1 .  linaclotide (LINZESS) 290 MCG CAPS capsule, Take 1 capsule (290 mcg total) by mouth daily. NO MORE REFILLS UNTIL APPOINTMENT WITH DR JACOBS, Disp: 30 capsule, Rfl: 0 .  pantoprazole (PROTONIX) 40 MG tablet, TAKE 1 TABLET TWICE A DAY, Disp: 60 tablet, Rfl: 6 .  prochlorperazine (COMPAZINE)  10 MG tablet, TAKE 1 TABLET (10 MG TOTAL) BY MOUTH EVERY 6 (SIX) HOURS AS NEEDED., Disp: 30 tablet, Rfl: 2 .  QUEtiapine (SEROQUEL) 300 MG tablet, Take 1 tablet (300 mg total) by mouth at bedtime., Disp: 90 tablet, Rfl: 0 .  sildenafil (VIAGRA) 100 MG tablet, TAKE 1 TABLET BY MOUTH EVERY DAY FOR ERECTILE DYSFUNCTION, Disp: 6 tablet, Rfl: 0 .  zolpidem (AMBIEN) 10 MG tablet, Take 1 tablet (10 mg total) by mouth at bedtime. for sleep, Disp: 90 tablet, Rfl: 0  EXAM:  VITALS per patient if applicable:  GENERAL: alert, oriented, appears well and in no acute distress  HEENT: atraumatic, conjunttiva  clear, no obvious abnormalities on inspection of external nose and ears  NECK: normal movements of the head and neck  LUNGS: on inspection no signs of respiratory distress, breathing rate appears normal, no obvious gross SOB, gasping or wheezing  CV: no obvious cyanosis  MS: moves all visible extremities without noticeable abnormality  PSYCH/NEURO: pleasant and cooperative, no obvious depression or anxiety, speech and thought processing grossly intact  ASSESSMENT AND PLAN:  Discussed the following assessment and plan:  Cough Dyspnea, unspecified type Lethargy   -in person exam advised given SOB, lightheaded, no known sick contacts - though the workers in the homes without masks raises concern for COVID19 -he reports hx of walking pna several times in the past -he agrees to go to nearby [facility] for eval, possible xray, COVID19 testing - advised to call ahead before going  I discussed the assessment and treatment plan with the patient. The patient was provided an opportunity to ask questions and all were answered. The patient agreed with the plan and demonstrated an understanding of the instructions.   The patient was advised to call back or seek an in-person evaluation if the symptoms worsen or if the condition fails to improve as anticipated.   Lucretia Kern, DO

## 2019-03-25 ENCOUNTER — Telehealth: Payer: Self-pay | Admitting: *Deleted

## 2019-03-25 NOTE — Telephone Encounter (Signed)
Pt returned vm to schedule TOC. No answer on FC line x3. Please return pt's call to schedule.

## 2019-03-25 NOTE — Telephone Encounter (Signed)
I left a detailed message stating we were calling to check on him and there is a note from the ER visit on 7/24.  I asked that he call back to schedule an appt with the new PCP of his choice as Dr Martinique, Dr Volanda Napoleon, Dr Ethlyn Gallery and Dr Jerilee Hoh are accepting new patients.

## 2019-03-25 NOTE — Telephone Encounter (Signed)
-----   Message from Lucretia Kern, DO sent at 03/24/2019  6:56 PM EDT ----- -call to check on pt and set up NPV with provider taking new patients-if doing better and did not go to urgent care and wants COVID19 testing - ok to place order

## 2019-03-26 LAB — NOVEL CORONAVIRUS, NAA (HOSP ORDER, SEND-OUT TO REF LAB; TAT 18-24 HRS): SARS-CoV-2, NAA: NOT DETECTED

## 2019-03-28 ENCOUNTER — Encounter (HOSPITAL_COMMUNITY): Payer: Self-pay

## 2019-03-28 NOTE — Telephone Encounter (Signed)
Called pt and lmom for pt to give the office a call back to get scheduled

## 2019-03-30 NOTE — Telephone Encounter (Signed)
Called pt again and lmom for the pt to give our office a call to get the [facility] appointment scheduled

## 2019-03-31 ENCOUNTER — Telehealth (HOSPITAL_COMMUNITY): Payer: Self-pay | Admitting: Emergency Medicine

## 2019-03-31 NOTE — Telephone Encounter (Signed)
Patient called about Covid test results, results negative, pt notified.

## 2019-04-05 ENCOUNTER — Encounter: Payer: BC Managed Care – PPO | Admitting: Internal Medicine

## 2019-04-05 ENCOUNTER — Other Ambulatory Visit: Payer: Self-pay

## 2019-04-07 ENCOUNTER — Other Ambulatory Visit: Payer: Self-pay

## 2019-04-07 ENCOUNTER — Encounter: Payer: Self-pay | Admitting: Internal Medicine

## 2019-04-07 DIAGNOSIS — Z0289 Encounter for other administrative examinations: Secondary | ICD-10-CM

## 2019-05-08 ENCOUNTER — Other Ambulatory Visit: Payer: Self-pay | Admitting: Psychiatry

## 2019-05-08 DIAGNOSIS — F3131 Bipolar disorder, current episode depressed, mild: Secondary | ICD-10-CM

## 2019-05-08 DIAGNOSIS — F451 Undifferentiated somatoform disorder: Secondary | ICD-10-CM

## 2019-05-17 ENCOUNTER — Ambulatory Visit (INDEPENDENT_AMBULATORY_CARE_PROVIDER_SITE_OTHER): Payer: BC Managed Care – PPO | Admitting: Psychiatry

## 2019-05-17 ENCOUNTER — Other Ambulatory Visit: Payer: Self-pay

## 2019-05-17 ENCOUNTER — Encounter: Payer: Self-pay | Admitting: Psychiatry

## 2019-05-17 VITALS — Ht 69.0 in | Wt 196.0 lb

## 2019-05-17 DIAGNOSIS — F410 Panic disorder [episodic paroxysmal anxiety] without agoraphobia: Secondary | ICD-10-CM | POA: Diagnosis not present

## 2019-05-17 DIAGNOSIS — F4010 Social phobia, unspecified: Secondary | ICD-10-CM | POA: Diagnosis not present

## 2019-05-17 DIAGNOSIS — G4733 Obstructive sleep apnea (adult) (pediatric): Secondary | ICD-10-CM

## 2019-05-17 DIAGNOSIS — F1021 Alcohol dependence, in remission: Secondary | ICD-10-CM

## 2019-05-17 DIAGNOSIS — F451 Undifferentiated somatoform disorder: Secondary | ICD-10-CM | POA: Diagnosis not present

## 2019-05-17 DIAGNOSIS — Z9989 Dependence on other enabling machines and devices: Secondary | ICD-10-CM

## 2019-05-17 DIAGNOSIS — F3131 Bipolar disorder, current episode depressed, mild: Secondary | ICD-10-CM | POA: Diagnosis not present

## 2019-05-17 MED ORDER — QUETIAPINE FUMARATE 400 MG PO TABS: 400.0000 mg | ORAL_TABLET | Freq: Every day | ORAL | 0 refills | Status: DC

## 2019-05-17 MED ORDER — LAMOTRIGINE 150 MG PO TABS: 300.0000 mg | ORAL_TABLET | Freq: Every day | ORAL | 0 refills | Status: DC

## 2019-05-17 MED ORDER — ALPRAZOLAM 1 MG PO TABS: 1.0000 mg | ORAL_TABLET | Freq: Three times a day (TID) | ORAL | 0 refills | Status: DC | PRN

## 2019-05-17 MED ORDER — ZOLPIDEM TARTRATE 10 MG PO TABS: 10.0000 mg | ORAL_TABLET | Freq: Every day | ORAL | 0 refills | Status: DC

## 2019-05-17 NOTE — Progress Notes (Signed)
[facility]  Patient ID: Sean Wu,  MRN: [id number]  PCP: Marletta Lor, MD  Date of Evaluation: 05/17/2019 Time spent:20 minutes from 1100 to 1120  Chief Complaint:  Chief Complaint    Depression; Manic Behavior; Anxiety; Panic Attack      HISTORY/CURRENT STATUS: Sean Wu is seen onsite in office 20 minutes face-to-face individually with consent with epic collateral for psychiatric interview and exam in 68-month evaluation and management of bipolar, social and panic anxiety, and history of somatoform in substance use disorders.  The patient is coping quite actively with coronavirus shutdown careful for health and emotions of wife more than self, staying upstairs while she is downstairs in her office.  He has rare panic despite having a lot of stress, but anxiety remains high.  His gym reopened but restrictions allow only 50% of usual performance such that he has gained 4 pounds in the last 3 months.  GI obstruction by slow transit remains symptomatic but he seems to have less headache, though he did have respiratory illness with negative coronavirus test in the interim.  He gradually discloses that Paxil was apparently stopped 03/24/2019 for his GI slow transit obstructive symptoms but that he has increased his Seroquel slightly therefore.  His mother communicates with him from her home frequently wanting to visit and spend future vacation time when any possible with him.  [place] registry documents last Ambien fill 03/15/2019 and last Xanax 02/14/2019 as he continues his established medical treatment structure without other incidental accessory substance use still somewhat preoccupied with having more than adequate medication available at home that he then does not need to take so that he has less anxiety.  He is accepting of household balances and not being employed himself.  He has no from mania, psychosis, suicidality, or delirium.   Depression        The patient presents  withbipolar depression a recurrentproblem for which current episode started more than 1 month ago. The onset quality is sudden. The problem occurs intermittently.The problem has been waxing and waningsince onset.Associated symptoms include oversensitivity,  insomnia,irritable,decreased interest,appetite change,indigestionand sad. Associated symptoms include no fatigue, no decreased concentration,no helplessness,no hopelessness, no headaches,and no suicidal ideas.The symptoms are aggravated by social issues and family issues.Past treatments include SSRIs - Selective serotonin reuptake inhibitors, SNRIs - Serotonin and norepinephrine reuptake inhibitors and other medications.Compliance with treatment is good.Past compliance problems include medical issues, difficulty with treatment plan and medication issues.Previous treatment provided moderaterelief.Risk factors include a change in medication usage/dosage, family history, family history of mental illness, history of mental illness, major life event, substance abuse and stress. Past medical history includes chronic illness,anxiety,bipolar disorder,depression,mental health disorderand head trauma. Pertinent negatives include no recent illness,no life-threatening condition,no recent psychiatric admission,no eating disorder,no obsessive-compulsive disorder,no post-traumatic stress disorder,no schizophreniaand no suicide attempts.   Individual Medical History/ Review of Systems: Changes? :Yes Having negative coronavirus test in July for respiratory illness  Allergies: Topamax [topiramate]  Current Medications:  Current Outpatient Medications:  .  albuterol (VENTOLIN HFA) 108 (90 Base) MCG/ACT inhaler, Inhale 1-2 puffs into the lungs every 6 (six) hours as needed for wheezing or shortness of breath., Disp: 18 g, Rfl: 0 .  ALPRAZolam (XANAX) 1 MG tablet, Take 1 tablet (1 mg total) by mouth 3 (three) times daily  as needed for anxiety., Disp: 270 tablet, Rfl: 0 .  cloNIDine (CATAPRES) 0.1 MG tablet, TAKE 1 TABLET BY MOUTH AT BEDTIME, Disp: 90 tablet, Rfl: 0 .  lamoTRIgine (LAMICTAL) 150 MG tablet,  Take 2 tablets (300 mg total) by mouth at bedtime., Disp: 180 tablet, Rfl: 0 .  linaclotide (LINZESS) 290 MCG CAPS capsule, Take 1 capsule (290 mcg total) by mouth daily. NO MORE REFILLS UNTIL APPOINTMENT WITH DR JACOBS, Disp: 30 capsule, Rfl: 0 .  ondansetron (ZOFRAN ODT) 8 MG disintegrating tablet, 1/2- 1 tablet q 8 hr prn nausea, vomiting, Disp: 20 tablet, Rfl: 0 .  pantoprazole (PROTONIX) 40 MG tablet, TAKE 1 TABLET TWICE A DAY, Disp: 60 tablet, Rfl: 6 .  prochlorperazine (COMPAZINE) 10 MG tablet, TAKE 1 TABLET (10 MG TOTAL) BY MOUTH EVERY 6 (SIX) HOURS AS NEEDED., Disp: 30 tablet, Rfl: 2 .  QUEtiapine (SEROQUEL) 400 MG tablet, Take 1 tablet (400 mg total) by mouth at bedtime., Disp: 90 tablet, Rfl: 0 .  sildenafil (VIAGRA) 100 MG tablet, TAKE 1 TABLET BY MOUTH EVERY DAY FOR ERECTILE DYSFUNCTION, Disp: 6 tablet, Rfl: 0 .  Spacer/Aero-Holding Chambers (AEROCHAMBER PLUS) inhaler, Use as instructed, Disp: 1 each, Rfl: 2 .  zolpidem (AMBIEN) 10 MG tablet, Take 1 tablet (10 mg total) by mouth at bedtime. for sleep, Disp: 90 tablet, Rfl: 0   Medication Side Effects: none  Family Medical/ Social History: Changes? No  MENTAL HEALTH EXAM:  There were no vitals taken for this visit.There is no height or weight on file to calculate BMI.  Weight 196 pounds with height 69 inches otherwise deferred for coronavirus shutdown except Muscle strengths and tone 5/5, postural reflexes and gait 0/0, and AIMS = 0.  General Appearance: Casual, Fairly Groomed and Guarded  Eye Contact:  Fair  Speech:  Clear and Coherent, Normal Rate and Talkative  Volume:  Normal  Mood:  Anxious, Dysphoric, Euthymic, Irritable and Worthless  Affect:  Congruent, Depressed, Labile, Restricted and Anxious  Thought Process:  Goal Directed,  Irrelevant and Descriptions of Associations: Loose  Orientation:  Full (Time, Place, and Person)  Thought Content: Ilusions, Obsessions, Paranoid Ideation and Rumination   Suicidal Thoughts:  No  Homicidal Thoughts:  No  Memory:  Immediate;   Good Remote;   Good  Judgement:  Fair  Insight:  Fair  Psychomotor Activity:  Normal, Increased, Mannerisms and Restlessness  Concentration:  Concentration: Fair and Attention Span: Good  Recall:  Good  Fund of Knowledge: Good  Language: Good  Assets:  Desire for Improvement Leisure Time Resilience  ADL's:  Intact  Cognition: WNL  Prognosis:  Fair    DIAGNOSES:    ICD-10-CM   1. Bipolar I disorder, mild, current or most recent episode depressed, with mixed features (HCC)  F31.31 ALPRAZolam (XANAX) 1 MG tablet    zolpidem (AMBIEN) 10 MG tablet    QUEtiapine (SEROQUEL) 400 MG tablet    lamoTRIgine (LAMICTAL) 150 MG tablet  2. Social anxiety disorder  F40.10 ALPRAZolam (XANAX) 1 MG tablet    zolpidem (AMBIEN) 10 MG tablet  3. Panic disorder  F41.0   4. Somatic symptom disorder  F45.1 zolpidem (AMBIEN) 10 MG tablet  5. OSA on CPAP  G47.33 zolpidem (AMBIEN) 10 MG tablet   Z99.89   6. Alcohol use disorder, severe, in sustained remission ([place])  F10.21     Receiving Psychotherapy: No    RECOMMENDATIONS: Psychosupportive psychoeducation integrates medication with cognitive behavioral nutrition, sleep hygiene, social skills, and frustration management for symptom treatment matching.  In such processing, he acknowledges that he has been taking more often 400 mg rather than 300 mg Seroquel dose agreed-upon last appointment for this time of cognitive and somatic dissonance over  unavoidable stress.  Paxil is discontinued as already on 03/24/2019.  He is E scribed Xanax 1 mg 3 times daily as needed for anxiety sent as #270 [facility] [address] for panic and social anxiety.  He has recent supply of clonidine 0.1 mg at bedtime if  needed for insomnia #90 and [facility] from anxiety.  Lamictal is renewed E [facility] to [facility] as 150 mg tablet taking 2 tablets total 300 mg every bedtime [facility].  Seroquel is increased to 400 mg IR tablet every bedtime sent as #90 with [facility] for [facility].  Ambien is E scribed 10 mg every bedtime #90 with no refill [facility] for anxious and mood related insomnia.Marland Kitchen  He returns in 3 months.   Delight Hoh, MD

## 2019-06-08 ENCOUNTER — Other Ambulatory Visit: Payer: Self-pay | Admitting: Psychiatry

## 2019-06-08 DIAGNOSIS — F3131 Bipolar disorder, current episode depressed, mild: Secondary | ICD-10-CM

## 2019-06-08 DIAGNOSIS — F4010 Social phobia, unspecified: Secondary | ICD-10-CM

## 2019-06-08 DIAGNOSIS — F451 Undifferentiated somatoform disorder: Secondary | ICD-10-CM

## 2019-06-21 ENCOUNTER — Other Ambulatory Visit: Payer: Self-pay | Admitting: Psychiatry

## 2019-06-21 DIAGNOSIS — F451 Undifferentiated somatoform disorder: Secondary | ICD-10-CM

## 2019-06-21 DIAGNOSIS — F3131 Bipolar disorder, current episode depressed, mild: Secondary | ICD-10-CM

## 2019-06-21 DIAGNOSIS — F4010 Social phobia, unspecified: Secondary | ICD-10-CM

## 2019-08-05 DIAGNOSIS — B338 Other specified viral diseases: Secondary | ICD-10-CM | POA: Diagnosis not present

## 2019-08-05 DIAGNOSIS — Z20828 Contact with and (suspected) exposure to other viral communicable diseases: Secondary | ICD-10-CM | POA: Diagnosis not present

## 2019-08-09 ENCOUNTER — Telehealth (INDEPENDENT_AMBULATORY_CARE_PROVIDER_SITE_OTHER): Payer: BC Managed Care – PPO | Admitting: Family Medicine

## 2019-08-09 ENCOUNTER — Other Ambulatory Visit: Payer: Self-pay

## 2019-08-09 ENCOUNTER — Encounter: Payer: Self-pay | Admitting: Family Medicine

## 2019-08-09 VITALS — Ht 69.0 in | Wt 190.0 lb

## 2019-08-09 DIAGNOSIS — R059 Cough, unspecified: Secondary | ICD-10-CM

## 2019-08-09 DIAGNOSIS — R197 Diarrhea, unspecified: Secondary | ICD-10-CM | POA: Diagnosis not present

## 2019-08-09 DIAGNOSIS — R519 Headache, unspecified: Secondary | ICD-10-CM

## 2019-08-09 DIAGNOSIS — Z20828 Contact with and (suspected) exposure to other viral communicable diseases: Secondary | ICD-10-CM

## 2019-08-09 DIAGNOSIS — R0981 Nasal congestion: Secondary | ICD-10-CM

## 2019-08-09 DIAGNOSIS — R05 Cough: Secondary | ICD-10-CM | POA: Diagnosis not present

## 2019-08-09 DIAGNOSIS — R432 Parageusia: Secondary | ICD-10-CM

## 2019-08-09 DIAGNOSIS — Z20822 Contact with and (suspected) exposure to covid-19: Secondary | ICD-10-CM

## 2019-08-09 MED ORDER — ALBUTEROL SULFATE HFA 108 (90 BASE) MCG/ACT IN AERS: 2.0000 | INHALATION_SPRAY | Freq: Four times a day (QID) | RESPIRATORY_TRACT | 0 refills | Status: DC | PRN

## 2019-08-09 MED ORDER — DOXYCYCLINE HYCLATE 100 MG PO TABS: 100.0000 mg | ORAL_TABLET | Freq: Two times a day (BID) | ORAL | 0 refills | Status: DC

## 2019-08-09 NOTE — Patient Instructions (Signed)
Follow up: seek medical care promptly if any worsening, difficulty breathing worsens or persist, severe headache, new concerns or not improving with treatment.  -I sent the medication(s) we discussed to your pharmacy: Meds ordered this encounter  Medications  . albuterol (VENTOLIN HFA) 108 (90 Base) MCG/ACT inhaler    Sig: Inhale 2 puffs into the lungs every 6 (six) hours as needed for shortness of breath.    Dispense:  8.5 g    Refill:  0  . doxycycline (VIBRA-TABS) 100 MG tablet    Sig: Take 1 tablet (100 mg total) by mouth 2 (two) times daily.    Dispense:  14 tablet    Refill:  0    Please let us know if you have any questions or concerns regarding this prescription.  I hope you are feeling better soon! Seek care promptly if your symptoms worsen, new concerns arise or you are not improving with treatment.   Self Isolation/Home Quarantine: -see the CDC site for information:   RunningShows.co.za.html   -STAY HOME except for to seek medical care -stay in your own room away from others in your house and use a separate bathroom if possible -Wash hands frequently, disinfect high touch surface areas often, wear a mask if you leave your room and interact as little as possible with others -seek medical care immediately if worsening - call our office for a visit or call ahead if going elsewhere to an urgent care  -seek emergency care if very sick or severe symptoms - call 911 -isolate for at least 10 days from the onset of symptoms PLUS 1 days of no fever PLUS 1 days of improving symptoms  We recommend wearing a mask, social distancing, good hand hygiene and asking others  around you to do the same at all times when around others outside of your home unit throughout the [place] pandemic.

## 2019-08-09 NOTE — Progress Notes (Signed)
Virtual Visit via Video Note  I connected with Sean Wu  on 08/09/19 at  3:40 PM EST by a video enabled telemedicine application and verified that I am speaking with the correct person using two identifiers.  Location patient: home Location provider:work or home office Persons participating in the virtual visit: patient, provider  I discussed the limitations of evaluation and management by telemedicine and the availability of in person appointments. The patient expressed understanding and agreed to proceed.   HPI:  Acute visit for upper resp symptoms: -symptoms started about 7-8 days ago -symptoms include: nasal congestion, cough, HA, frontal sinus pain, fatigue, mild SOB, loss of taste, body aches, temp of 99 a few days ago, diarrhea -had the flu shot a few months ago -reports has had 2 negative tests at an [facility], done on the same day about 3 days into the illness -no known sick contacts but he was going to the gym and masks were not worn at the time -no tick bites -denies any PMH lung disease or immunosupression  ROS: See pertinent positives and negatives per HPI.  Past Medical History:  Diagnosis Date  . ALLERGIC RHINITIS 05/10/2009  . Allergy    anemia  . Anxiety   . Bipolar disorder ([place])    see medications patient to bring  . Chronic headaches   . DDD (degenerative disc disease)   . DEPRESSION 05/10/2009  . Gastroparesis   . GERD 05/10/2009  . LIBIDO, DECREASED 05/10/2009  . Neuromuscular disorder ([place])    pt. denies 08/01/16  . Osgood-Schlatter's disease   . SLEEP APNEA, OBSTRUCTIVE 06/12/2009   CPAP not utilized after 30 lb. weight loss  . SMOKER 06/12/2009  . TESTICULAR HYPOFUNCTION 06/12/2009  . WEIGHT GAIN 05/10/2009    Past Surgical History:  Procedure Laterality Date  . ANKLE SURGERY Left   . COLONOSCOPY  2014   normal  . EYE SURGERY     eye implant  . FRACTURE SURGERY     surgery on right ankle for ligament torn 2013  . LASIK Bilateral   . SHOULDER ARTHROSCOPY  WITH DISTAL CLAVICLE RESECTION Right 02/26/2015   Procedure: SHOULDER DIAGNOSTIC ARTHROSCOPY WITH OPEN DISTAL CLAVICLE EXCISION;  Surgeon: Tania Ade, MD;  Location: [facility];  Service: Orthopedics;  Laterality: Right;  Right diagnostic arthroscopy, open distal clavical excision    Family History  Problem Relation Age of Onset  . Heart disease Maternal Grandfather   . Irritable bowel syndrome Father   . Alcoholism Father   . Heart attack Father   . Bipolar disorder Father   . Colon cancer Neg Hx   . Rectal cancer Neg Hx   . Stomach cancer Neg Hx     SOCIAL HX: see hpi   Current Outpatient Medications:  .  ALPRAZolam (XANAX) 1 MG tablet, Take 1 tablet (1 mg total) by mouth 3 (three) times daily as needed for anxiety., Disp: 270 tablet, Rfl: 0 .  lamoTRIgine (LAMICTAL) 150 MG tablet, Take 2 tablets (300 mg total) by mouth at bedtime., Disp: 180 tablet, Rfl: 0 .  linaclotide (LINZESS) 290 MCG CAPS capsule, Take 1 capsule (290 mcg total) by mouth daily. NO MORE REFILLS UNTIL APPOINTMENT WITH DR JACOBS, Disp: 30 capsule, Rfl: 0 .  QUEtiapine (SEROQUEL) 400 MG tablet, Take 1 tablet (400 mg total) by mouth at bedtime., Disp: 90 tablet, Rfl: 0 .  zolpidem (AMBIEN) 10 MG tablet, Take 1 tablet (10 mg total) by mouth at bedtime. for sleep, Disp: 90 tablet,  Rfl: 0 .  albuterol (VENTOLIN HFA) 108 (90 Base) MCG/ACT inhaler, Inhale 2 puffs into the lungs every 6 (six) hours as needed for shortness of breath., Disp: 8.5 g, Rfl: 0 .  doxycycline (VIBRA-TABS) 100 MG tablet, Take 1 tablet (100 mg total) by mouth 2 (two) times daily., Disp: 14 tablet, Rfl: 0  EXAM:  VITALS per patient if applicable:denies fever  GENERAL: alert, oriented, appears well and in no acute distress  HEENT: atraumatic, conjunttiva clear, no obvious abnormalities on inspection of external nose and ears  NECK: normal movements of the head and neck  LUNGS: on inspection no signs of respiratory distress,  breathing rate appears normal, no obvious gross SOB, gasping or wheezing  CV: no obvious cyanosis  MS: moves all visible extremities without noticeable abnormality  PSYCH/NEURO: pleasant and cooperative, no obvious depression or anxiety, speech and thought processing grossly intact  ASSESSMENT AND PLAN:  Discussed the following assessment and plan:  Suspected COVID-19 virus infection  Cough  Sinus congestion  Diarrhea, unspecified type  Loss of taste  headache, unspecified chronicity pattern, unspecified headache type  -we discussed possible serious and likely etiologies, options for evaluation and workup, limitations of telemedicine visit vs in person visit, treatment, treatment risks and precautions. Pt prefers to treat via telemedicine empirically rather then risking or undertaking an in person visit at this moment. COVID19, VURI, influenza all possible. Possibly developing sinusitis vs other resp inf given ongoing symptoms, thick sinus congestion. He opted for inhaler in case of bronchitis, doxy for good sinus and resp coverage, home isolation in case of COVID19, symptomatic care and prompt ER or UCC evaluation if worsening, new symptoms arise, or if is not improving with treatment. Pt needs PCP, advised and sent message to schedulers to assist.   I discussed the assessment and treatment plan with the patient. The patient was provided an opportunity to ask questions and all were answered. The patient agreed with the plan and demonstrated an understanding of the instructions.   The patient was advised to call back or seek an in-person evaluation if the symptoms worsen or if the condition fails to improve as anticipated.   Lucretia Kern, DO

## 2019-08-16 ENCOUNTER — Encounter: Payer: Self-pay | Admitting: Psychiatry

## 2019-08-16 ENCOUNTER — Ambulatory Visit (INDEPENDENT_AMBULATORY_CARE_PROVIDER_SITE_OTHER): Payer: BC Managed Care – PPO | Admitting: Psychiatry

## 2019-08-16 ENCOUNTER — Other Ambulatory Visit: Payer: Self-pay

## 2019-08-16 VITALS — Ht 69.0 in | Wt 216.0 lb

## 2019-08-16 DIAGNOSIS — F410 Panic disorder [episodic paroxysmal anxiety] without agoraphobia: Secondary | ICD-10-CM

## 2019-08-16 DIAGNOSIS — F4010 Social phobia, unspecified: Secondary | ICD-10-CM

## 2019-08-16 DIAGNOSIS — F451 Undifferentiated somatoform disorder: Secondary | ICD-10-CM | POA: Diagnosis not present

## 2019-08-16 DIAGNOSIS — F3131 Bipolar disorder, current episode depressed, mild: Secondary | ICD-10-CM

## 2019-08-16 DIAGNOSIS — Z9989 Dependence on other enabling machines and devices: Secondary | ICD-10-CM

## 2019-08-16 DIAGNOSIS — G4733 Obstructive sleep apnea (adult) (pediatric): Secondary | ICD-10-CM

## 2019-08-16 MED ORDER — QUETIAPINE FUMARATE 400 MG PO TABS: 400.0000 mg | ORAL_TABLET | Freq: Every day | ORAL | 0 refills | Status: DC

## 2019-08-16 MED ORDER — ALPRAZOLAM 1 MG PO TABS: 1.0000 mg | ORAL_TABLET | Freq: Three times a day (TID) | ORAL | 0 refills | Status: DC | PRN

## 2019-08-16 MED ORDER — LAMOTRIGINE 150 MG PO TABS: 300.0000 mg | ORAL_TABLET | Freq: Every day | ORAL | 0 refills | Status: DC

## 2019-08-16 MED ORDER — ZOLPIDEM TARTRATE 10 MG PO TABS: 10.0000 mg | ORAL_TABLET | Freq: Every day | ORAL | 0 refills | Status: DC

## 2019-08-16 NOTE — Progress Notes (Signed)
[facility]  Patient ID: ERMAN IKNER,  MRN: [id number]  PCP: Marletta Lor, MD  Date of Evaluation: 08/16/2019 Time spent:20 minutes from 1320 to 1340  Chief Complaint:  Chief Complaint    Depression; Manic Behavior; Anxiety; Panic Attack      HISTORY/CURRENT STATUS: Sean Wu is seen onsite in office 20 minutes face-to-face individually with consent with epic collateral for psychiatric interview and exam and 84-month evaluation and management of bipolar disorder, panic and social anxiety disorders, and somatic symptom disorder with history of alcohol use disorder in sustained remission.  Patient presents some regression to the previous mean of keeping large supplies of medications on hand for addressing every potential discomfort and dysfunction in the past.  He reviews no longer having the Librium wishing he did have it, though accepting that the Xanax alone is sufficient for medication needs.  He finds Seroquel currently his most important and crucial medication treatment acknowledging that 400 mg is required many nights for adequate sleep but he always doses initially as 200 mg or 1/2 tablet not taking the second half if not needed.  He no longer takes any clonidine and has that deleted from his list. Of concern in the interim, he has gained 20 pounds after Thanksgiving with the at home shutdown going to the gym as his home being an ongoing source of stress with construction underway.  Currently he is on the third day without water to the household as plummers and other services are in the house restructuring.  He is more positive about his mother lately stating she aims to be less controlling and intrusive, as he acknowledges that maternal grandmother was mean to his mother.  He addresses upcoming stress of Christmas including for mother and wife likely both together in [place] as he has to navigate all those potential stressors.  Wife is working in the home as they  remodel as another stressor.  However he acknowledges that social and panic anxiety are overall better.  [facility] registry documents last Ambien dispensing was 06/10/2019 and last Xanax fill was 05/17/2019 documenting that he uses less than the prescribed supply having refills remaining at each juncture of the 57-month expiration.  He has no mania, suicidality, psychosis or delirium  Depression        The patient presents withbipolar depressionas a recurrentproblemfor whichmost recent episode started more than 4 month ago. The onset quality is sudden. The problem occurs intermittently.The problem has been waxing and waningsince onset.Associated symptoms include oversensitivity,  insomnia,irritable,appetite change,and sad. Associated symptoms include no fatigue, no decreased concentration, nodecreased interest, no helplessness,no hopelessness, no headaches, no indigestion,and no suicidal ideas.The symptoms are aggravated by social issues and family issues.Past treatments include SSRIs - Selective serotonin reuptake inhibitors, SNRIs - Serotonin and norepinephrine reuptake inhibitors and other medications.Compliance with treatment is good.Past compliance problems include medical issues, difficulty with treatment plan and medication issues.Previous treatment provided moderaterelief.Risk factors include a change in medication usage/dosage, family history, family history of mental illness, history of mental illness, major life event, substance abuse and stress. Past medical history includes chronic illness,anxiety,bipolar disorder,depression,mental health disorderand head trauma. Pertinent negatives include no recent illness,no life-threatening condition,no recent psychiatric admission,no eating disorder,no obsessive-compulsive disorder,no post-traumatic stress disorder,no schizophreniaand no suicide attempts.   Individual Medical History/ Review of Systems: Changes? :No  with no interim Covid or other medical visits  Allergies: Topamax [topiramate]  Current Medications:  Current Outpatient Medications:  .  albuterol (VENTOLIN HFA) 108 (90 Base) MCG/ACT inhaler, Inhale 2  puffs into the lungs every 6 (six) hours as needed for shortness of breath., Disp: 8.5 g, Rfl: 0 .  ALPRAZolam (XANAX) 1 MG tablet, Take 1 tablet (1 mg total) by mouth 3 (three) times daily as needed for anxiety., Disp: 270 tablet, Rfl: 0 .  doxycycline (VIBRA-TABS) 100 MG tablet, Take 1 tablet (100 mg total) by mouth 2 (two) times daily., Disp: 14 tablet, Rfl: 0 .  lamoTRIgine (LAMICTAL) 150 MG tablet, Take 2 tablets (300 mg total) by mouth at bedtime., Disp: 180 tablet, Rfl: 0 .  linaclotide (LINZESS) 290 MCG CAPS capsule, Take 1 capsule (290 mcg total) by mouth daily. NO MORE REFILLS UNTIL APPOINTMENT WITH DR JACOBS, Disp: 30 capsule, Rfl: 0 .  QUEtiapine (SEROQUEL) 400 MG tablet, Take 1 tablet (400 mg total) by mouth at bedtime., Disp: 90 tablet, Rfl: 0 .  zolpidem (AMBIEN) 10 MG tablet, Take 1 tablet (10 mg total) by mouth at bedtime. for sleep, Disp: 90 tablet, Rfl: 0   Medication Side Effects: weight gain  Family Medical/ Social History: Changes? No  MENTAL HEALTH EXAM:  Height 5\' 9"  (1.753 m), weight 216 lb (98 kg).Body mass index is 31.9 kg/m. Muscle strengths and tone 5/5, postural reflexes and gait 0/0, and AIMS = 0 others deferred for coronavirus shutdown  General Appearance: Casual, Fairly Groomed, Guarded and Obese  Eye Contact:  Fair  Speech:  Clear and Coherent, Garbled, Normal Rate and Talkative  Volume:  Normal  Mood:  Anxious, Dysphoric, Euthymic and Irritable  Affect:  Congruent, Depressed, Inappropriate, Full Range and Anxious  Thought Process:  Coherent, Goal Directed, Irrelevant and Descriptions of Associations: Tangential  Orientation:  Full (Time, Place, and Person)  Thought Content: Obsessions, Rumination and Tangential   Suicidal Thoughts:  No  Homicidal  Thoughts:  No  Memory:  Immediate;   Good Remote;   Good  Judgement:  Fair  Insight:  Fair  Psychomotor Activity:  Normal, Increased, Decreased and Mannerisms  Concentration:  Concentration: Fair and Attention Span: Good  Recall:  Good  Fund of Knowledge: Good  Language: Fair  Assets:  Desire for Improvement Leisure Time Resilience Talents/Skills  ADL's:  Intact  Cognition: WNL  Prognosis:  Fair    DIAGNOSES:    ICD-10-CM   1. Bipolar I disorder, mild, current or most recent episode depressed, with mixed features ([place])  F31.31   2. Social anxiety disorder  F40.10   3. Panic disorder  F41.0   4. Somatic symptom disorder  F45.1     Receiving Psychotherapy: No    RECOMMENDATIONS: He considers his medication list smaller than ever but is also pleased with his progress.  Psychosupportive psychoeducation clarifies sleep hygiene, behavioral nutrition, and social skill intervention needs.  He is E scribed to continue Seroquel 400 mg every bedtime though at least 30% to 50% of the time taking only 200 mg 1/2 tablet sent as #90 with no refill to [facility] [address] for bipolar and panic disorder.  He continues Lamictal 150 mg taking 2 every bedtime sent as a 90-day supply for bipolar disorder, Ambien 10 mg nightly #90 with no refill sent for insomnia of anxiety and mood disorders, and Xanax 1 mg 3 times daily as needed panic and social anxiety each with no refill to [facility] [address].  He returns for follow-up in 3 months.  Delight Hoh, MD

## 2019-08-19 ENCOUNTER — Other Ambulatory Visit: Payer: Self-pay | Admitting: Psychiatry

## 2019-08-19 DIAGNOSIS — F451 Undifferentiated somatoform disorder: Secondary | ICD-10-CM

## 2019-09-05 ENCOUNTER — Other Ambulatory Visit: Payer: Self-pay | Admitting: Family Medicine

## 2019-10-10 ENCOUNTER — Telehealth: Payer: Self-pay | Admitting: Family Medicine

## 2019-10-10 NOTE — Telephone Encounter (Signed)
Called pt to see if he wanted to schedule a new pt appt with one of our providers accepting new pts per Colin Benton note. Left vm to call back

## 2019-11-04 ENCOUNTER — Ambulatory Visit: Payer: Self-pay | Admitting: Family Medicine

## 2019-11-04 DIAGNOSIS — Z0289 Encounter for other administrative examinations: Secondary | ICD-10-CM

## 2019-11-07 ENCOUNTER — Other Ambulatory Visit: Payer: Self-pay | Admitting: Psychiatry

## 2019-11-07 DIAGNOSIS — F410 Panic disorder [episodic paroxysmal anxiety] without agoraphobia: Secondary | ICD-10-CM

## 2019-11-07 DIAGNOSIS — F3131 Bipolar disorder, current episode depressed, mild: Secondary | ICD-10-CM

## 2019-11-11 ENCOUNTER — Other Ambulatory Visit: Payer: Self-pay | Admitting: Psychiatry

## 2019-11-11 DIAGNOSIS — F4010 Social phobia, unspecified: Secondary | ICD-10-CM

## 2019-11-11 DIAGNOSIS — F3131 Bipolar disorder, current episode depressed, mild: Secondary | ICD-10-CM

## 2019-11-11 NOTE — Telephone Encounter (Signed)
Last dispensing 08/16/2019 per [place] Registry sending 90 day refill as compliant and consistent in therapeutics with no contraindication being medically necessary to [facility] [address].

## 2019-11-11 NOTE — Telephone Encounter (Signed)
Next apt 03/16

## 2019-11-15 ENCOUNTER — Other Ambulatory Visit: Payer: Self-pay

## 2019-11-15 ENCOUNTER — Encounter: Payer: Self-pay | Admitting: Psychiatry

## 2019-11-15 ENCOUNTER — Ambulatory Visit (INDEPENDENT_AMBULATORY_CARE_PROVIDER_SITE_OTHER): Payer: BC Managed Care – PPO | Admitting: Psychiatry

## 2019-11-15 VITALS — Ht 69.0 in | Wt 221.0 lb

## 2019-11-15 DIAGNOSIS — Z9989 Dependence on other enabling machines and devices: Secondary | ICD-10-CM

## 2019-11-15 DIAGNOSIS — F410 Panic disorder [episodic paroxysmal anxiety] without agoraphobia: Secondary | ICD-10-CM | POA: Diagnosis not present

## 2019-11-15 DIAGNOSIS — G4733 Obstructive sleep apnea (adult) (pediatric): Secondary | ICD-10-CM

## 2019-11-15 DIAGNOSIS — F3131 Bipolar disorder, current episode depressed, mild: Secondary | ICD-10-CM

## 2019-11-15 DIAGNOSIS — F451 Undifferentiated somatoform disorder: Secondary | ICD-10-CM

## 2019-11-15 DIAGNOSIS — F4010 Social phobia, unspecified: Secondary | ICD-10-CM | POA: Diagnosis not present

## 2019-11-15 DIAGNOSIS — F1021 Alcohol dependence, in remission: Secondary | ICD-10-CM

## 2019-11-15 MED ORDER — BREXPIPRAZOLE 2 MG PO TABS: 2.0000 mg | ORAL_TABLET | Freq: Every day | ORAL | 0 refills | Status: DC

## 2019-11-15 MED ORDER — BELSOMRA 15 MG PO TABS: 15.0000 mg | ORAL_TABLET | Freq: Every evening | ORAL | 0 refills | Status: DC | PRN

## 2019-11-15 MED ORDER — ZOLPIDEM TARTRATE 10 MG PO TABS: 10.0000 mg | ORAL_TABLET | Freq: Every day | ORAL | 0 refills | Status: DC

## 2019-11-15 MED ORDER — BREXPIPRAZOLE 1 MG PO TABS: 1.0000 mg | ORAL_TABLET | Freq: Every day | ORAL | 0 refills | Status: DC

## 2019-11-15 MED ORDER — LAMOTRIGINE 150 MG PO TABS: 300.0000 mg | ORAL_TABLET | Freq: Every day | ORAL | 0 refills | Status: DC

## 2019-11-15 MED ORDER — BELSOMRA 20 MG PO TABS: 20.0000 mg | ORAL_TABLET | Freq: Every evening | ORAL | 0 refills | Status: DC | PRN

## 2019-11-15 MED ORDER — ONDANSETRON HCL 4 MG PO TABS: 4.0000 mg | ORAL_TABLET | Freq: Two times a day (BID) | ORAL | 0 refills | Status: DC | PRN

## 2019-11-15 NOTE — Progress Notes (Signed)
[facility]  Patient ID: WELDEN MANWELL,  MRN: [id number]  PCP: Marletta Lor, MD  Date of Evaluation: 11/15/2019 Time spent:20 minutes from 1305 to 1325  Chief Complaint:  Chief Complaint    Depression; Manic Behavior; Anxiety; Alcohol Problem      HISTORY/CURRENT STATUS: Sean Wu is seen onsite in office 20 minutes face-to-face individually with consent with epic collateral for psychiatric interview and exam in 65-month evaluation and management of bipolar most recently depressed, social anxiety and panic, somatic symptom disorder, alcohol use disorder in remission, and obstructive sleep apnea not definitely wearing his CPAP.  Patient is in pain and emotional distress today as he has predominantly a full mandibular dental extraction with sutures and metallic markers present where he reports an apparent alveolar abscessing infection avulsed 2 teeth falling out in the night and apparently being swallowed requiring antibiotics to next have implants possibly also to be extended to the upper maxillary jaw.  He is using ibuprofen for pain as he does not tolerate opiates.  He denies recent alcohol, cocaine, nicotine or other substance.  Sleep has been difficult to achieve alternating Ambien and Seroquel changing the Seroquel nightly between 200 and even more than 400 mg.  He inquires about an alternative for the Ambien having discussed Belsomra in the past, Rexulti as an alternative for the Seroquel, and asks for a supply of Compazine 10 mg for nausea with his gastroparesis and GERD reporting his primary care retired and he cannot see the new one for at least a month or two.  Otherwise he takes his Xanax and Lamictal being off of Paxil, Librium, and Catapres.  His mother did visit twice around Christmas but he does not have to join her in a cruise.  Water was restored to his home with remodeling complete with wife apparently still working from home.  He has no mania, suicidality,  psychosis or delirium in the interim.  Depression The patient presents withbipolardepressionas a recurrentproblemmost recent episode starting more than 7 month ago. The onset quality is sudden. The problem occurs intermittently.The problem has been waxing and waningsince onset.Associated symptomsinclude oversensitivity,insomnia,irritability,appetite change, morbid self-defeat, boredom,and reactive sadness. Associated symptoms include no fatigue, nodecreased concentration, nodecreased interest, no helplessness,no hopelessness, noheadaches, no indigestion,and no suicidal ideas.The symptoms are aggravated by social issues and family issues.Past treatments include SSRIs - Selective serotonin reuptake inhibitors, SNRIs - Serotonin and norepinephrine reuptake inhibitors and other medications.Compliance with treatment is fair.Past compliance problems include medical issues, difficulty with treatment plan and medication issues.Previous treatment provided moderaterelief.Risk factors include a change in medication usage/dosage, family history, family history of mental illness, history of mental illness, major life event, substance abuse and stress. Past medical history includes chronic illness,anxiety,depression, bipolar disorder,mental health disorderand head trauma. Pertinent negatives include no recent illness,no life-threatening condition,no recent psychiatric admission,no eating disorder,no obsessive-compulsive disorder,no post-traumatic stress disorder,no schizophreniaand no suicide attempts.  Individual Medical History/ Review of Systems: Changes? :Yes Weight is up 5 pounds with BMI 32.6 and antibiotic and analgesic are required for dental infection with avulsions to require implants after extractions from the mandibular jaw.  He requests Compazine 10 mg stating he is taking it for years for nausea but allows substitution of Zofran instead till he can  see his new primary care in a month or two after retirement of the last PCP.  Allergies: Topamax [topiramate]  Current Medications:  Current Outpatient Medications:  .  albuterol (VENTOLIN HFA) 108 (90 Base) MCG/ACT inhaler, Inhale 2 puffs into the lungs every  6 (six) hours as needed for shortness of breath., Disp: 8.5 g, Rfl: 0 .  ALPRAZolam (XANAX) 1 MG tablet, TAKE 1 TABLET BY MOUTH 3 TIMES DAILY AS NEEDED FOR ANXIETY., Disp: 270 tablet, Rfl: 0 .  brexpiprazole (REXULTI) 1 MG TABS tablet, Take 1 tablet (1 mg total) by mouth daily., Disp: 14 tablet, Rfl: 0 .  brexpiprazole (REXULTI) 2 MG TABS tablet, Take 1 tablet (2 mg total) by mouth daily., Disp: 14 tablet, Rfl: 0 .  doxycycline (VIBRA-TABS) 100 MG tablet, Take 1 tablet (100 mg total) by mouth 2 (two) times daily., Disp: 14 tablet, Rfl: 0 .  lamoTRIgine (LAMICTAL) 150 MG tablet, Take 2 tablets (300 mg total) by mouth at bedtime., Disp: 180 tablet, Rfl: 0 .  linaclotide (LINZESS) 290 MCG CAPS capsule, Take 1 capsule (290 mcg total) by mouth daily. NO MORE REFILLS UNTIL APPOINTMENT WITH DR JACOBS, Disp: 30 capsule, Rfl: 0 .  ondansetron (ZOFRAN) 4 MG tablet, Take 1 tablet (4 mg total) by mouth 2 (two) times daily as needed for nausea or vomiting., Disp: 60 tablet, Rfl: 0 .  Suvorexant (BELSOMRA) 15 MG TABS, Take 15 mg by mouth at bedtime as needed., Disp: 3 tablet, Rfl: 0 .  Suvorexant (BELSOMRA) 20 MG TABS, Take 20 mg by mouth at bedtime as needed., Disp: 6 tablet, Rfl: 0 .  zolpidem (AMBIEN) 10 MG tablet, Take 1 tablet (10 mg total) by mouth at bedtime. for sleep, Disp: 90 tablet, Rfl: 0  Medication Side Effects: weight gain  Family Medical/ Social History: Changes? No  MENTAL HEALTH EXAM:  Height 5\' 9"  (1.753 m), weight 221 lb (100.2 kg).Body mass index is 32.64 kg/m. Muscle strengths and tone 5/5, postural reflexes and gait 0/0, and AIMS = 0 otherwise deferred for coronavirus shutdown  General Appearance: Casual, Fairly Groomed,  Guarded, Obese and With much dental pain apologizing for drooling  Eye Contact:  Fair  Speech:  Clear and Coherent, Garbled, Normal Rate and Talkative  Volume:  Normal  Mood:  Anxious, Dysphoric, Euthymic, Irritable and Worthless  Affect:  Congruent, Depressed, Inappropriate, Labile and Anxious  Thought Process:  Coherent, Irrelevant and Descriptions of Associations: Tangential  Orientation:  Full (Time, Place, and Person)  Thought Content: Logical, Ilusions, Rumination and Tangential   Suicidal Thoughts:  No  Homicidal Thoughts:  No  Memory:  Immediate;   Good Remote;   Good  Judgement:  Fair  Insight:  Fair and Lacking  Psychomotor Activity:  Normal, Increased, Mannerisms and Restlessness  Concentration:  Concentration: Fair and Attention Span: Fair  Recall:  [employer] of Knowledge: Good  Language: Good  Assets:  Resilience Social Support Talents/Skills  ADL's:  Intact  Cognition: WNL  Prognosis:  Fair    DIAGNOSES:    ICD-10-CM   1. Bipolar I disorder, mild, current or most recent episode depressed, with mixed features (HCC)  F31.31 lamoTRIgine (LAMICTAL) 150 MG tablet    zolpidem (AMBIEN) 10 MG tablet    Suvorexant (BELSOMRA) 15 MG TABS    Suvorexant (BELSOMRA) 20 MG TABS    brexpiprazole (REXULTI) 1 MG TABS tablet    brexpiprazole (REXULTI) 2 MG TABS tablet  2. Social anxiety disorder  F40.10 zolpidem (AMBIEN) 10 MG tablet  3. Panic disorder  F41.0 Suvorexant (BELSOMRA) 15 MG TABS    Suvorexant (BELSOMRA) 20 MG TABS  4. Somatic symptom disorder  F45.1 zolpidem (AMBIEN) 10 MG tablet    ondansetron (ZOFRAN) 4 MG tablet  5. Alcohol use disorder, severe, in  sustained remission ([place])  F10.21   6. OSA on CPAP  G47.33 zolpidem (AMBIEN) 10 MG tablet   Z99.89 Suvorexant (BELSOMRA) 15 MG TABS    Suvorexant (BELSOMRA) 20 MG TABS    Receiving Psychotherapy: No    RECOMMENDATIONS: The patient's adjustment of Seroquel frequently over the last 3 years targeting sleep more  than mood may destabilize mood even as sufficient sleep is accomplished to maintain the next day.  However, the patient has taken any number of medications times simultaneously to try to secure sleep.  He is currently discontinued from clonidine, Paxil, and Librium and only has Ambien and Xanax.  He would request today to move ahead with the previous discussion of replacing Seroquel with Rexulti and facilitating sleep with Belsomra rather than Ambien if effective.  He is provided Belsomra samples as 15 mg #3 and 20 mg #6 to take 1 tablet of either not both at night for insomnia to notify the office of mg most effective if he seeks to continue the [place] in place of Ambien 10 mg nightly as needed for insomnia sent as #90 with no refill to [facility] [address].  Likewise he is provided samples of Rexulti to replace Seroquel as 1 mg every morning samples #14 and then 2 mg tablet as one half or 1 every morning thereafter for 14 to 28 days for bipolar disorder.  He may then contact the office or through the pharmacy for refills of either.  Otherwise he is prescribed Xanax 1 mg 3 times daily as needed for panic or other anxiety #270 with no refill [facility] to [facility] [address].  Ambien is sent 10 mg nightly #90 with no refill for insomnia and anxiety to  [facility] [address].  Lamictal is sent 150 mg tablet to take 2 tablets total to 300 mg every bedtime as #180 no refill to [facility] [address] for bipolar disorder.  He is E scribed Zofran 4 mg twice daily as needed for nausea or vomiting sent as #60 with no refill.  He understands warnings and risk of diagnoses and treatment including medication for prevention and monitoring, safety hygiene, and crisis plans if needed.  He returns for follow-up in 3 months or sooner if needed.   Delight Hoh, MD

## 2019-12-30 DIAGNOSIS — H04123 Dry eye syndrome of bilateral lacrimal glands: Secondary | ICD-10-CM | POA: Diagnosis not present

## 2020-01-03 ENCOUNTER — Other Ambulatory Visit: Payer: Self-pay | Admitting: Family Medicine

## 2020-01-04 ENCOUNTER — Encounter: Payer: Self-pay | Admitting: Nurse Practitioner

## 2020-01-04 ENCOUNTER — Ambulatory Visit: Payer: BC Managed Care – PPO | Admitting: Nurse Practitioner

## 2020-01-04 VITALS — BP 110/72 | HR 96 | Temp 97.7°F | Ht 67.75 in | Wt 224.0 lb

## 2020-01-04 DIAGNOSIS — K3184 Gastroparesis: Secondary | ICD-10-CM | POA: Diagnosis not present

## 2020-01-04 DIAGNOSIS — K5909 Other constipation: Secondary | ICD-10-CM

## 2020-01-04 DIAGNOSIS — R140 Abdominal distension (gaseous): Secondary | ICD-10-CM

## 2020-01-04 MED ORDER — LUBIPROSTONE 8 MCG PO CAPS: 8.0000 ug | ORAL_CAPSULE | Freq: Two times a day (BID) | ORAL | 0 refills | Status: DC

## 2020-01-04 NOTE — Progress Notes (Signed)
IMPRESSION and PLAN:    49 year old male with PMH significant for bipolar disorder , depression, chronic constipation, gastroparesis, DDD, sleep apnea, mild diverticulosis  # Bloating / nausea  --Started 1.5 months ago --Known gastroparesis but has done well over the last few years without medication --Query if constipation contributing factor.  Linzess no longer working for him. Will first treat his constipation to see if that helps --Follow up with me ~ 3 weeks. If nausea / bloating don't improve with resolution of constipation then consider Reglan.  Records imply that he has taken it before but he no recollection of it. Will discuss further when I see him back  # Chronic constipation --Takes Linzess as needed, uses every few days for a bowel purge.  Cannot tolerate it on a daily basis.  As of late, Linzess hasn't been giving him adequate purge. Doesn't feel empty after BM.  --Recommend he purge bowels with Mg+ citrate then start trial of Amitiza 8 mcg twice daily.  Samples given.  --Discontinue Linzess --Increase water intake to 64 ounces daily --Okay to eat diet rich in fiber but not adding a fiber supplement due to bloating / history of gastroparesis      # Colon cancer screening --10-year recall colonoscopy due March 2024  HPI:    Primary GI: Dr. Ardis Hughs  Chief complaint : Bloating and nausea  Patient is a 49 year old male known to Dr. Ardis Hughs.  He was last seen May 2018 for evaluation of chronic constipation.  He was worked up in 2014 for nausea, weight loss, gastroparesis, dysphagia.  EGD July 2014 found H. pylori negative gastritis and retained food in stomach.  Gastric emptying study in 2014 confirmed gastroparesis.  Metoclopramide was not helpful.  Repeat EGD November 2017 by Dr. Ardis Hughs showed mild esophagitis and H. pylori negative gastritis.  HISTORY SINCE LAST VISIT Sean Wu says he has done very well from a GI standpoint over the last few years.  About a month and a  half ago he developed problems with abdominal bloating and nausea.  Symptoms not necessarily postprandial but even if they do occur after eating it is not related to the type or amount of food consumed.  He also complains of silent hot air belches.  He has not vomited.  Weight is up but he attributes that to working out and developing muscle mass.  Additionally, patient has a history of chronic constipation.  He takes Linzess as needed, mainly for purging.  He generally feels fine for a few days after Linzess then takes it again when needed.  Now he is not emptying as well on Linzess.  He will not take it every day because of the loose stool that it causes.  He feels constipated, last BM was several days ago.  Patient doubts he drinks adequate water.  He has not started any new medications or changed diet.  He is physically active  Review of systems:     No chest pain, no SOB, no fevers, no urinary sx   Past Medical History:  Diagnosis Date  . ALLERGIC RHINITIS 05/10/2009  . Allergy    anemia  . Anxiety   . Bipolar disorder ([place])    see medications patient to bring  . Chronic headaches   . DDD (degenerative disc disease)   . DEPRESSION 05/10/2009  . Gastroparesis   . GERD 05/10/2009  . LIBIDO, DECREASED 05/10/2009  . Neuromuscular disorder ([place])    pt. denies 08/01/16  . Osgood-Schlatter's disease   .  SLEEP APNEA, OBSTRUCTIVE 06/12/2009   CPAP not utilized after 30 lb. weight loss  . SMOKER 06/12/2009  . TESTICULAR HYPOFUNCTION 06/12/2009  . WEIGHT GAIN 05/10/2009    Patient's surgical history, family medical history, social history, medications and allergies were all reviewed in Epic   Creatinine clearance cannot be calculated (Patient's most recent lab result is older than the maximum 21 days allowed.)  Current Outpatient Medications  Medication Sig Dispense Refill  . ALPRAZolam (XANAX) 1 MG tablet TAKE 1 TABLET BY MOUTH 3 TIMES DAILY AS NEEDED FOR ANXIETY. 270 tablet 0  . lamoTRIgine  (LAMICTAL) 150 MG tablet Take 2 tablets (300 mg total) by mouth at bedtime. 180 tablet 0  . linaclotide (LINZESS) 290 MCG CAPS capsule Take 1 capsule (290 mcg total) by mouth daily. NO MORE REFILLS UNTIL APPOINTMENT WITH DR JACOBS 30 capsule 0  . zolpidem (AMBIEN) 10 MG tablet Take 1 tablet (10 mg total) by mouth at bedtime. for sleep 90 tablet 0  . brexpiprazole (REXULTI) 1 MG TABS tablet Take 1 tablet (1 mg total) by mouth daily. (Patient not taking: Reported on 01/04/2020) 14 tablet 0  . brexpiprazole (REXULTI) 2 MG TABS tablet Take 1 tablet (2 mg total) by mouth daily. (Patient not taking: Reported on 01/04/2020) 14 tablet 0  . QUEtiapine (SEROQUEL) 100 MG tablet Take by mouth.    . Suvorexant (BELSOMRA) 15 MG TABS Take 15 mg by mouth at bedtime as needed. (Patient not taking: Reported on 01/04/2020) 3 tablet 0  . Suvorexant (BELSOMRA) 20 MG TABS Take 20 mg by mouth at bedtime as needed. (Patient not taking: Reported on 01/04/2020) 6 tablet 0   No current facility-administered medications for this visit.    Physical Exam:     BP 110/72 (BP Location: Left Arm, Patient Position: Sitting, Cuff Size: Normal)   Pulse 96   Temp 97.7 F (36.5 C) (Other (Comment))   Ht 5' 7.75" (1.721 m)   Wt 224 lb (101.6 kg)   BMI 34.31 kg/m   GENERAL:  Pleasant male in NAD PSYCH: : Cooperative, normal affect CARDIAC:  RRR,  PULM: Normal respiratory effort, lungs CTA bilaterally, no wheezing ABDOMEN:  Nondistended, soft, nontender. No obvious masses, no hepatomegaly,  normal bowel sounds SKIN:  turgor, no lesions seen Musculoskeletal:  Normal muscle tone, normal strength NEURO: Alert and oriented x 3, no focal neurologic deficits   Tye Savoy , NP 01/04/2020, 11:20 AM

## 2020-01-04 NOTE — Patient Instructions (Addendum)
If you are age 51 or older, your body mass index should be between 23-30. Your Body mass index is 34.31 kg/m. If this is out of the aforementioned range listed, please consider follow up with your Primary Care Provider.  If you are age 47 or younger, your body mass index should be between 19-25. Your Body mass index is 34.31 kg/m. If this is out of the aformentioned range listed, please consider follow up with your Primary Care Provider.   You have been given samples of Amitiza 8 mcg to take with a meal twice daily. If this works, please call the office for a prescription.  Increase water to 64 ounces daily.  Purge bowels with Magnesium Citrate - use as directed.  Eat small frequent meals.  Follow up with me on 01/25/20 at 11 am.  Thank you for choosing me and [facility].   Tye Savoy, NP.

## 2020-01-05 ENCOUNTER — Encounter: Payer: Self-pay | Admitting: Nurse Practitioner

## 2020-01-05 NOTE — Progress Notes (Signed)
I agree with the above note, plan 

## 2020-01-09 ENCOUNTER — Telehealth: Payer: Self-pay | Admitting: Nurse Practitioner

## 2020-01-10 ENCOUNTER — Encounter: Payer: Self-pay | Admitting: Nurse Practitioner

## 2020-01-10 ENCOUNTER — Other Ambulatory Visit: Payer: Self-pay | Admitting: Psychiatry

## 2020-01-10 ENCOUNTER — Telehealth: Payer: Self-pay

## 2020-01-10 DIAGNOSIS — F410 Panic disorder [episodic paroxysmal anxiety] without agoraphobia: Secondary | ICD-10-CM

## 2020-01-10 DIAGNOSIS — F4010 Social phobia, unspecified: Secondary | ICD-10-CM

## 2020-01-10 DIAGNOSIS — F3131 Bipolar disorder, current episode depressed, mild: Secondary | ICD-10-CM

## 2020-01-10 MED ORDER — QUETIAPINE FUMARATE 400 MG PO TABS: 400.0000 mg | ORAL_TABLET | Freq: Every day | ORAL | 0 refills | Status: DC

## 2020-01-10 MED ORDER — ALPRAZOLAM 1 MG PO TABS: 1.0000 mg | ORAL_TABLET | Freq: Three times a day (TID) | ORAL | 0 refills | Status: DC | PRN

## 2020-01-10 MED ORDER — LUBIPROSTONE 24 MCG PO CAPS: 24.0000 ug | ORAL_CAPSULE | Freq: Two times a day (BID) | ORAL | 3 refills | Status: DC

## 2020-01-10 NOTE — Telephone Encounter (Signed)
Patient called and left a message that said that he was out of town this past weekend and left his xanax and seroquel at the hotel and needs you to send in scripts to his pharmacy. Please give him a call at [phone]

## 2020-01-10 NOTE — Telephone Encounter (Signed)
OPENED IN ERROR

## 2020-01-10 NOTE — Addendum Note (Signed)
Addended by: Candie Mile on: 01/10/2020 01:30 PM   Modules accepted: Orders

## 2020-01-10 NOTE — Telephone Encounter (Signed)
Patient aware to pick up new prescription.

## 2020-01-10 NOTE — Telephone Encounter (Signed)
I do not think we have samples of the 24 mcg but we can call them into his pharmacy to take twice daily #60.Thanks

## 2020-01-10 NOTE — Telephone Encounter (Signed)
Return phone call to patient could only leave message that 30-day supply of Xanax 1 mg 3 times daily as needed for panic or social anxiety [facility] [address] with request they notify him if he can pick up possibly his private pay that portion he lost of the last 90-day fill.  I also sent Seroquel 400 mg every bedtime #30 with no refill having sent the 100 mg on 01/04/2020 apparently placing the Rexulti not adequate but needing higher dose for now having lost medications at the hotel out of town.

## 2020-01-11 ENCOUNTER — Telehealth: Payer: Self-pay | Admitting: Nurse Practitioner

## 2020-01-11 NOTE — Telephone Encounter (Signed)
Pt calling again about this prescription. He wants to know if an alternative for Amatiza could be prescribed. He is not sure of why his pharmacy could not rf amatiza. He stated that they did not give him a reason.

## 2020-01-12 NOTE — Telephone Encounter (Signed)
Pt called to inform that he contacted his insurance and was informed that generic of Amatiza (Lubiprostone) is covered but needs PA. No other similar med is covered. Pharmacy told pt that they faxed PA form 3 days ago and are pending to hear from [place].

## 2020-01-12 NOTE — Telephone Encounter (Signed)
Pt called to express his frustration because neither his pharmacy or [place] gave him the option to pay out-of-pocket for Amatiza. He stated that he is going to call his insurance to find out what the out-of-pocket price is for this med.

## 2020-01-12 NOTE — Telephone Encounter (Signed)
Spoke with patient today.  I told him his insurance will not cover Amitiza. I asked him to call his insurance to find out what they will cover, and call us back and we will send to pharmacy.

## 2020-01-13 NOTE — Telephone Encounter (Signed)
Pt called back  Regarding this msg. He stated that the insurance will pay if we get pre-authorization for Amatiza.

## 2020-01-16 ENCOUNTER — Other Ambulatory Visit: Payer: Self-pay | Admitting: Psychiatry

## 2020-01-16 ENCOUNTER — Telehealth: Payer: Self-pay | Admitting: Psychiatry

## 2020-01-16 DIAGNOSIS — F451 Undifferentiated somatoform disorder: Secondary | ICD-10-CM

## 2020-01-16 NOTE — Telephone Encounter (Signed)
Pharmacy request for Zofran refill is declined as discontinued by [facility] 01/04/2020

## 2020-01-16 NOTE — Telephone Encounter (Signed)
Please review

## 2020-01-17 ENCOUNTER — Telehealth: Payer: Self-pay

## 2020-01-17 NOTE — Telephone Encounter (Signed)
Amitiza approved.  Pt aware.

## 2020-01-25 ENCOUNTER — Ambulatory Visit: Payer: BC Managed Care – PPO | Admitting: Nurse Practitioner

## 2020-02-14 ENCOUNTER — Other Ambulatory Visit: Payer: Self-pay

## 2020-02-14 ENCOUNTER — Ambulatory Visit (INDEPENDENT_AMBULATORY_CARE_PROVIDER_SITE_OTHER): Payer: BC Managed Care – PPO | Admitting: Psychiatry

## 2020-02-14 ENCOUNTER — Encounter: Payer: Self-pay | Admitting: Psychiatry

## 2020-02-14 DIAGNOSIS — F4010 Social phobia, unspecified: Secondary | ICD-10-CM | POA: Diagnosis not present

## 2020-02-14 DIAGNOSIS — F3131 Bipolar disorder, current episode depressed, mild: Secondary | ICD-10-CM | POA: Diagnosis not present

## 2020-02-14 DIAGNOSIS — G4733 Obstructive sleep apnea (adult) (pediatric): Secondary | ICD-10-CM

## 2020-02-14 DIAGNOSIS — F410 Panic disorder [episodic paroxysmal anxiety] without agoraphobia: Secondary | ICD-10-CM | POA: Diagnosis not present

## 2020-02-14 DIAGNOSIS — F451 Undifferentiated somatoform disorder: Secondary | ICD-10-CM

## 2020-02-14 DIAGNOSIS — Z9989 Dependence on other enabling machines and devices: Secondary | ICD-10-CM

## 2020-02-14 MED ORDER — ZOLPIDEM TARTRATE 10 MG PO TABS: 10.0000 mg | ORAL_TABLET | Freq: Every day | ORAL | 0 refills | Status: DC

## 2020-02-14 MED ORDER — LAMOTRIGINE 150 MG PO TABS: 300.0000 mg | ORAL_TABLET | Freq: Every day | ORAL | 0 refills | Status: DC

## 2020-02-14 MED ORDER — ALPRAZOLAM 1 MG PO TABS: ORAL_TABLET | ORAL | 0 refills | Status: DC

## 2020-02-14 MED ORDER — QUETIAPINE FUMARATE 200 MG PO TABS: 200.0000 mg | ORAL_TABLET | Freq: Two times a day (BID) | ORAL | 0 refills | Status: DC

## 2020-02-14 NOTE — Progress Notes (Signed)
[facility]  Patient ID: Sean Wu,  MRN: [id number]  PCP: Patient, No Pcp Per  Date of Evaluation: 02/14/2020 Time spent:25 minutes from 1300 to 1325  Chief Complaint:  Chief Complaint    Depression; Manic Behavior; Anxiety; Panic Attack      HISTORY/CURRENT STATUS: Sean Wu is seen Onsite in office 25 minutes face-to-face individually with consent with epic collateral for psychiatric interview and exam in 60-month evaluation and management of bipolar depression, social and panic anxiety, somatic symptom disorder, and obstructive sleep apnea.  He sees the new primary care in a couple of weeks and does have authorization for Amitiza he continues.  Dental work remains underway since last appointment as upper implants have been cracking and the lower have not been inserted.   registry documents last Ambien 02/03/2020 not taking Belsomra as ineffective and his last Xanax dispensing was 01/02/2020. He takes Ambien when Seroquel does not work for sleep dosing Seroquel between 200 and 400 mg not taking 600 mg any longer. Rexulti replacing Seroquel was  not helpful from last appointment as well.  He has a new workout plan but weight is up 1 pound in 3 months.  Mother visited for 5 days and bought his wife a new car before she returned home.  The patient has a sense of becoming up-to-date on his medical and dental care soon.  He has no mania, suicidality, psychosis or delirium.  Depression The patient presents withbipolardepressionas arecurrentproblemmost recentepisode starting 10 month ago. The onset quality is sudden. The problem occurs intermittently.The problem has been waxing and waningsince onset.Associated symptomsinclude oversensitivity,insomnia,irritability,appetite change, morbid cognitive fixations, social withdrawal, decreased interest, boredom,and reactive sadness. Associated symptoms include no fatigue, nodecreased concentration,nodecreased  interest, no helplessness,no hopelessness, noheadaches,noindigestion,and no suicidal ideas.The symptoms are aggravated by social issues and family issues.Past treatments include SSRIs - Selective serotonin reuptake inhibitors, SNRIs - Serotonin and norepinephrine reuptake inhibitors and other medications.Compliance with treatment is fair.Past compliance problems include medical issues, difficulty with treatment plan and medication issues.Previous treatment provided moderaterelief.Risk factors include a change in medication usage/dosage, family history, family history of mental illness, history of mental illness, major life event, substance abuse and stress. Past medical history includes chronic illness,anxiety,depression, bipolar disorder,mental health disorderand head trauma. Pertinent negatives include no recent illness,no life-threatening condition,no recent psychiatric admission,no eating disorder,no obsessive-compulsive disorder,no post-traumatic stress disorder,no schizophreniaand no suicide attempts.  Individual Medical History/ Review of Systems: Changes? :Yes He is taking Amitiza now and starts with new primary care in a couple of weeks for hyperlipidemia, obesity, GERD, gastroparesis, sleep apnea, low testosterone, migraine and history of cerebral concussion.  Dental implants continue to be placed.  Allergies: Topamax [topiramate]  Current Medications:  Current Outpatient Medications:  .  ALPRAZolam (XANAX) 1 MG tablet, Take 1 tablet (1 mg total) by mouth 3 (three) times daily as needed for anxiety (Panic)., Disp: 90 tablet, Rfl: 0 .  ALPRAZolam (XANAX) 1 MG tablet, TAKE 1 TABLET BY MOUTH 3 TIMES DAILY AS NEEDED FOR ANXIETY., Disp: 270 tablet, Rfl: 0 .  lamoTRIgine (LAMICTAL) 150 MG tablet, Take 2 tablets (300 mg total) by mouth at bedtime., Disp: 180 tablet, Rfl: 0 .  linaclotide (LINZESS) 290 MCG CAPS capsule, Take 1 capsule (290 mcg total) by mouth daily.  NO MORE REFILLS UNTIL APPOINTMENT WITH DR JACOBS, Disp: 30 capsule, Rfl: 0 .  lubiprostone (AMITIZA) 24 MCG capsule, Take 1 capsule (24 mcg total) by mouth 2 (two) times daily with a meal., Disp: 60 capsule, Rfl: 3 .  QUEtiapine (SEROQUEL) 200 MG tablet, Take 1 tablet (200 mg total) by mouth 2 (two) times daily., Disp: 180 tablet, Rfl: 0 .  zolpidem (AMBIEN) 10 MG tablet, Take 1 tablet (10 mg total) by mouth at bedtime. for sleep, Disp: 90 tablet, Rfl: 0  Medication Side Effects: weight gain  Family Medical/ Social History: Changes? No  MENTAL HEALTH EXAM:  Height 5' 7.75" (1.721 m), weight 222 lb (100.7 kg).Body mass index is 34 kg/m. Muscle strengths and tone 5/5, postural reflexes and gait 0/0, and AIMS = 0.  General Appearance: Casual, Fairly Groomed, Guarded and Obese  Eye Contact:  Fair  Speech:  Clear and Coherent, Garbled, Normal Rate and Talkative  Volume:  Normal  Mood:  Anxious, Dysphoric, Euthymic and Irritable  Affect:  Congruent, Depressed, Inappropriate, Labile and Anxious  Thought Process:  Coherent, Irrelevant and Descriptions of Associations: Tangential  Orientation:  Full (Time, Place, and Person)  Thought Content: Logical, Ilusions, Rumination and Tangential   Suicidal Thoughts:  No  Homicidal Thoughts:  No  Memory:  Immediate;   Good Remote;   Good  Judgement:  Fair  Insight:  Fair  Psychomotor Activity:  Normal, Increased, Mannerisms and Restlessness  Concentration:  Concentration: Fair and Attention Span: Good  Recall:  [place] of Knowledge: Good  Language: Good  Assets:  Desire for Improvement Intimacy Resilience Talents/Skills  ADL's:  Intact  Cognition: WNL  Prognosis:  Fair    DIAGNOSES:    ICD-10-CM   1. Bipolar I disorder, mild, current or most recent episode depressed, with mixed features (HCC)  F31.31 QUEtiapine (SEROQUEL) 200 MG tablet    lamoTRIgine (LAMICTAL) 150 MG tablet    ALPRAZolam (XANAX) 1 MG tablet    zolpidem (AMBIEN) 10 MG  tablet  2. Panic disorder  F41.0   3. Social anxiety disorder  F40.10 ALPRAZolam (XANAX) 1 MG tablet    zolpidem (AMBIEN) 10 MG tablet  4. Somatic symptom disorder  F45.1 zolpidem (AMBIEN) 10 MG tablet  5. OSA on CPAP  G47.33 zolpidem (AMBIEN) 10 MG tablet   Z99.89     Receiving Psychotherapy: No    RECOMMENDATIONS: Off of Rexulti and Belsomra from last appointment trials, patient again has 4 psychotropic medications.  Psychoeducation is provided regarding warnings and risks of diagnoses and medications including prevention and monitoring safety hygiene.  He is E scribed Seroquel 200 mg taking 1 tablet twice daily using both in the evening sent as #180 with no refill to [facility] [address] for bipolar depressed panic disorder.  He is E scribed to continue Lamictal 150 mg taking 2 every bedtime sent as #180 with no refill to [facility] [address] for bipolar depressed.  He is E scribed Ambien 2 mg every bedtime sent #90 with no refill to [facility] [address] for insomnia associated with anxiety and bipolar as well as sleep apnea.  He is E scribed Xanax 1 mg 3 times daily as needed #270 with no refill for panic and social anxiety sent to [facility] [address].  He returns for follow-up in 3 months or sooner if needed.   Delight Hoh, MD

## 2020-02-23 ENCOUNTER — Other Ambulatory Visit: Payer: Self-pay

## 2020-02-24 ENCOUNTER — Encounter: Payer: Self-pay | Admitting: Family Medicine

## 2020-02-24 ENCOUNTER — Ambulatory Visit: Payer: BC Managed Care – PPO | Admitting: Family Medicine

## 2020-02-24 VITALS — BP 110/78 | HR 89 | Temp 97.9°F | Resp 16 | Ht 67.75 in | Wt 226.5 lb

## 2020-02-24 DIAGNOSIS — E785 Hyperlipidemia, unspecified: Secondary | ICD-10-CM | POA: Diagnosis not present

## 2020-02-24 DIAGNOSIS — K3184 Gastroparesis: Secondary | ICD-10-CM | POA: Diagnosis not present

## 2020-02-24 DIAGNOSIS — R110 Nausea: Secondary | ICD-10-CM

## 2020-02-24 DIAGNOSIS — K5909 Other constipation: Secondary | ICD-10-CM

## 2020-02-24 MED ORDER — METOCLOPRAMIDE HCL 5 MG PO TABS: 5.0000 mg | ORAL_TABLET | Freq: Three times a day (TID) | ORAL | 1 refills | Status: DC

## 2020-02-24 MED ORDER — PROCHLORPERAZINE MALEATE 10 MG PO TABS: 10.0000 mg | ORAL_TABLET | Freq: Every day | ORAL | 0 refills | Status: DC | PRN

## 2020-02-24 NOTE — Progress Notes (Signed)
HPI: [name] is a 49 y.o. male, who is here today to establish care.  Former PCP: Dr Maudie Mercury Last preventive routine visit: 02/22/16.  Chronic medical problems: Bipolar disorder,anxiety,insomnia,gastroparesis,HLD.  He follows with psychiatrist every q 3 months. Dental disease, which he attributes to some of his psych medications.  Constipation: Recently started on Amitiza 24 mg bid.  Medication seems to be helping. He follows with GI. Linzess caused severe diarrhea.  Concerns today: Nausea.  Requesting refills on Prochlorperazine 10 mg, which was prescribed years ago and helps with nausea. Dx'ed with gastroparesis. Negative for abdominal pain,dysphagia,changing in bowel movement,melena, or blood in stool. It seems to be worse when he eats meat.  HLD: He is on non pharmacologic treatment.  Lab Results  Component Value Date   CHOL 240 (H) 02/22/2016   HDL 40.30 02/22/2016   LDLDIRECT 141.0 02/22/2016   TRIG 226.0 (H) 02/22/2016   CHOLHDL 6 02/22/2016    Review of Systems  Constitutional: Negative for activity change, appetite change and fever.  HENT: Negative for mouth sores, nosebleeds and sore throat.   Eyes: Negative for redness and visual disturbance.  Respiratory: Negative for cough, shortness of breath and wheezing.   Cardiovascular: Negative for chest pain, palpitations and leg swelling.  Neurological: Negative for syncope, weakness and headaches.  Psychiatric/Behavioral: Negative for confusion. The patient is nervous/anxious.   Rest see pertinent positives and negatives per HPI.  Current Outpatient Medications on File Prior to Visit  Medication Sig Dispense Refill  . ALPRAZolam (XANAX) 1 MG tablet TAKE 1 TABLET BY MOUTH 3 TIMES DAILY AS NEEDED FOR ANXIETY. 270 tablet 0  . lamoTRIgine (LAMICTAL) 150 MG tablet Take by mouth.    . lubiprostone (AMITIZA) 24 MCG capsule Take 1 capsule (24 mcg total) by mouth 2 (two) times daily with a meal. 60 capsule 3   . QUEtiapine (SEROQUEL) 200 MG tablet Take 1 tablet (200 mg total) by mouth 2 (two) times daily. 180 tablet 0  . zolpidem (AMBIEN) 10 MG tablet Take 1 tablet (10 mg total) by mouth at bedtime. for sleep 90 tablet 0  . [DISCONTINUED] omeprazole (PRILOSEC OTC) 20 MG tablet 1 tablet twice daily. Do not eat for one hour after ingestion of the medication 60 tablet 11   No current facility-administered medications on file prior to visit.    Past Medical History:  Diagnosis Date  . ALLERGIC RHINITIS 05/10/2009  . Allergy    anemia  . Anxiety   . Bipolar disorder ([place])    see medications patient to bring  . Chronic headaches   . DDD (degenerative disc disease)   . DEPRESSION 05/10/2009  . Gastroparesis   . GERD 05/10/2009  . LIBIDO, DECREASED 05/10/2009  . Neuromuscular disorder ([place])    pt. denies 08/01/16  . Osgood-Schlatter's disease   . SLEEP APNEA, OBSTRUCTIVE 06/12/2009   CPAP not utilized after 30 lb. weight loss  . SMOKER 06/12/2009  . TESTICULAR HYPOFUNCTION 06/12/2009  . WEIGHT GAIN 05/10/2009   Allergies  Allergen Reactions  . Topamax [Topiramate]     Sleep walking, hallucinations, "I get mean, can't help it"    Family History  Problem Relation Age of Onset  . Heart disease Maternal Grandfather   . Irritable bowel syndrome Father   . Alcoholism Father   . Heart attack Father   . Bipolar disorder Father   . Colon cancer Neg Hx   . Rectal cancer Neg Hx   . Stomach cancer Neg  Hx     Social History   Socioeconomic History  . Marital status: Married    Spouse name: [name]  . Number of children: 0  . Years of education: 59  . Highest education level: Not on file  Occupational History  . Occupation: Unemployeed  Tobacco Use  . Smoking status: Former Smoker    Packs/day: 0.50    Years: 15.00    Pack years: 7.50    Types: Cigarettes  . Smokeless tobacco: Former Systems developer    Types: Chew    Quit date: 09/01/1998  . Tobacco comment: tobacco info given 08/01/13  Vaping Use    . Vaping Use: Never used  Substance and Sexual Activity  . Alcohol use: Not Currently    Alcohol/week: 1.0 standard drink    Types: 1 Cans of beer per week    Comment: social, rare  . Drug use: No  . Sexual activity: Yes  Other Topics Concern  . Not on file  Social History Narrative   Lives with wife at home   Caffeine use- tea, 3 glasses daily   Social Determinants of Health   Financial Resource Strain:   . Difficulty of Paying Living Expenses:   Food Insecurity:   . Worried About Charity fundraiser in the Last Year:   . Arboriculturist in the Last Year:   Transportation Needs:   . Film/video editor (Medical):   Marland Kitchen Lack of Transportation (Non-Medical):   Physical Activity:   . Days of Exercise per Week:   . Minutes of Exercise per Session:   Stress:   . Feeling of Stress :   Social Connections:   . Frequency of Communication with Friends and Family:   . Frequency of Social Gatherings with Friends and Family:   . Attends Religious Services:   . Active Member of Clubs or Organizations:   . Attends Archivist Meetings:   Marland Kitchen Marital Status:     Vitals:   02/24/20 0903  BP: 110/78  Pulse: 89  Resp: 16  Temp: 97.9 F (36.6 C)  SpO2: 94%   Body mass index is 34.69 kg/m.  Physical Exam Constitutional:      General: He is not in acute distress.    Appearance: He is well-developed.  HENT:     Head: Normocephalic and atraumatic.     Mouth/Throat:     Mouth: Mucous membranes are moist.     Pharynx: Oropharynx is clear.     Comments: Missing upper teeth. Eyes:     Conjunctiva/sclera: Conjunctivae normal.     Pupils: Pupils are equal, round, and reactive to light.  Cardiovascular:     Rate and Rhythm: Normal rate and regular rhythm.     Heart sounds: No murmur heard.      Comments: DP pulses present. Pulmonary:     Effort: Pulmonary effort is normal.     Breath sounds: No wheezing or rales.  Abdominal:     General: There is no distension.      Palpations: Abdomen is soft. There is no hepatomegaly or mass.     Tenderness: There is no abdominal tenderness.  Musculoskeletal:     Cervical back: Neck supple.     Right lower leg: No edema.     Left lower leg: No edema.  Lymphadenopathy:     Cervical: No cervical adenopathy.  Skin:    General: Skin is warm.  Neurological:     Mental Status: He is alert  and oriented to person, place, and time.     Coordination: Coordination normal.     Gait: Gait normal.  Psychiatric:     Comments: Well groomed,good eye contact.    ASSESSMENT AND PLAN:  [name] was seen today for transfer of care.  Diagnoses and all orders for this visit:  Nausea without vomiting He does not take medication daily.  No changes in current management.  -     prochlorperazine (COMPAZINE) 10 MG tablet; Take 1 tablet (10 mg total) by mouth daily as needed.  Gastroparesis After discussing some side effects including tarditive dyskinesis,he would like to try. Also recommend avoiding food that could aggravate problem.  Constipation, chronic Adequate hydration and fiber intake. Continue imatiza. Following with GI.  Hyperlipidemia He is not fasting today. Continue non pharmacologic treatment. Will check next visit.  Return in about 2 months (around 04/25/2020) for cpe.   Reba Hulett G. [place], MD  [facility]. [place] office.   A few things to remember from today's visit:   Gastroparesis - Plan: metoCLOPramide (REGLAN) 5 MG tablet  Constipation, chronic  Hyperlipidemia, unspecified hyperlipidemia type  Today we started a new medication to help with stomach emptying. Avoid foods that can aggravate problems. Monitor for side effect.  If you need refills please call your pharmacy. Do not use My Chart to request refills or for acute issues that need immediate attention.    Please be sure medication list is accurate. If a new problem present, please set up appointment sooner than  planned today.

## 2020-02-24 NOTE — Assessment & Plan Note (Addendum)
Adequate hydration and fiber intake. Continue imatiza. Following with GI.

## 2020-02-24 NOTE — Assessment & Plan Note (Signed)
After discussing some side effects including tarditive dyskinesis,he would like to try. Also recommend avoiding food that could aggravate problem.

## 2020-02-24 NOTE — Assessment & Plan Note (Signed)
He is not fasting today. Continue non pharmacologic treatment. Will check next visit.

## 2020-02-24 NOTE — Patient Instructions (Addendum)
A few things to remember from today's visit:   Gastroparesis - Plan: metoCLOPramide (REGLAN) 5 MG tablet  Constipation, chronic  Hyperlipidemia, unspecified hyperlipidemia type  Today we started a new medication to help with stomach emptying. Avoid foods that can aggravate problems. Monitor for side effect.  If you need refills please call your pharmacy. Do not use My Chart to request refills or for acute issues that need immediate attention.    Please be sure medication list is accurate. If a new problem present, please set up appointment sooner than planned today.

## 2020-04-15 ENCOUNTER — Other Ambulatory Visit: Payer: Self-pay | Admitting: Family Medicine

## 2020-04-15 DIAGNOSIS — K3184 Gastroparesis: Secondary | ICD-10-CM

## 2020-04-25 ENCOUNTER — Encounter: Payer: Self-pay | Admitting: Family Medicine

## 2020-04-25 ENCOUNTER — Ambulatory Visit (INDEPENDENT_AMBULATORY_CARE_PROVIDER_SITE_OTHER): Payer: BC Managed Care – PPO | Admitting: Family Medicine

## 2020-04-25 ENCOUNTER — Other Ambulatory Visit: Payer: Self-pay

## 2020-04-25 VITALS — BP 130/80 | HR 95 | Temp 97.4°F | Resp 16 | Ht 67.75 in | Wt 217.0 lb

## 2020-04-25 DIAGNOSIS — Z0000 Encounter for general adult medical examination without abnormal findings: Secondary | ICD-10-CM

## 2020-04-25 DIAGNOSIS — Z130 Encounter for screening for diseases of the blood and blood-forming organs and certain disorders involving the immune mechanism: Secondary | ICD-10-CM | POA: Diagnosis not present

## 2020-04-25 DIAGNOSIS — E785 Hyperlipidemia, unspecified: Secondary | ICD-10-CM | POA: Diagnosis not present

## 2020-04-25 DIAGNOSIS — Z1329 Encounter for screening for other suspected endocrine disorder: Secondary | ICD-10-CM

## 2020-04-25 DIAGNOSIS — Z13228 Encounter for screening for other metabolic disorders: Secondary | ICD-10-CM

## 2020-04-25 DIAGNOSIS — Z1159 Encounter for screening for other viral diseases: Secondary | ICD-10-CM | POA: Diagnosis not present

## 2020-04-25 NOTE — Progress Notes (Signed)
HPI: [name] is a 49 y.o.male here today for his routine physical examination.  Last CPE: 02/22/16. He lives with his wife.  Regular exercise 3 or more times per week: Almost daily, bikes and wt lifting. Following a healthy diet: Yes.  Chronic medical problems: HLD, insomnia,bipolar disorder (follows with psychiatrist,Dr Creig Hines), constipation,and gastroparesis among some.  He follows with GI regularly.  Immunization History  Administered Date(s) Administered   Influenza Split 06/15/2012   Influenza,inj,Quad PF,6+ Mos 06/03/2019   Influenza-Unspecified 06/13/2015   Tdap 11/14/2014   -Hep C screening: Never.  Last colon cancer screening: 11/29/2012 due to GI symptoms. Last prostate ca screening: Not sure. Nocturia x 1-2, stable for years.  HLD: He is on non pharmacologic treatment.  Lab Results  Component Value Date   CHOL 240 (H) 02/22/2016   HDL 40.30 02/22/2016   LDLDIRECT 141.0 02/22/2016   TRIG 226.0 (H) 02/22/2016   CHOLHDL 6 02/22/2016    Review of Systems  Constitutional: Negative for activity change, appetite change, fatigue and fever.  HENT: Negative for dental problem, nosebleeds, sore throat and trouble swallowing.   Eyes: Negative for redness and visual disturbance.  Respiratory: Negative for cough, shortness of breath and wheezing.   Cardiovascular: Negative for chest pain, palpitations and leg swelling.  Gastrointestinal: Positive for nausea. Negative for abdominal pain, blood in stool and vomiting.  Endocrine: Negative for cold intolerance, heat intolerance, polydipsia, polyphagia and polyuria.  Genitourinary: Negative for decreased urine volume, dysuria, genital sores, hematuria and testicular pain.  Musculoskeletal: Positive for arthralgias. Negative for gait problem and myalgias.  Skin: Negative for color change and rash.  Allergic/Immunologic: Positive for environmental allergies.  Neurological: Negative for syncope and headaches.    Hematological: Negative for adenopathy. Does not bruise/bleed easily.  Psychiatric/Behavioral: Positive for sleep disturbance. Negative for confusion and hallucinations.  All other systems reviewed and are negative.  Current Outpatient Medications on File Prior to Visit  Medication Sig Dispense Refill   ALPRAZolam (XANAX) 1 MG tablet TAKE 1 TABLET BY MOUTH 3 TIMES DAILY AS NEEDED FOR ANXIETY. 270 tablet 0   lamoTRIgine (LAMICTAL) 150 MG tablet Take by mouth.     lubiprostone (AMITIZA) 24 MCG capsule Take 1 capsule (24 mcg total) by mouth 2 (two) times daily with a meal. 60 capsule 3   metoCLOPramide (REGLAN) 5 MG tablet TAKE 1 TABLET (5 MG TOTAL) BY MOUTH 3 (THREE) TIMES DAILY BEFORE MEALS. 90 tablet 1   prochlorperazine (COMPAZINE) 10 MG tablet Take 1 tablet (10 mg total) by mouth daily as needed. 30 tablet 0   QUEtiapine (SEROQUEL) 200 MG tablet Take 1 tablet (200 mg total) by mouth 2 (two) times daily. 180 tablet 0   zolpidem (AMBIEN) 10 MG tablet Take 1 tablet (10 mg total) by mouth at bedtime. for sleep 90 tablet 0   [DISCONTINUED] omeprazole (PRILOSEC OTC) 20 MG tablet 1 tablet twice daily. Do not eat for one hour after ingestion of the medication 60 tablet 11   No current facility-administered medications on file prior to visit.   Past Medical History:  Diagnosis Date   ALLERGIC RHINITIS 05/10/2009   Allergy    anemia   Anxiety    Bipolar disorder ([place])    see medications patient to bring   Chronic headaches    DDD (degenerative disc disease)    DEPRESSION 05/10/2009   Gastroparesis    GERD 05/10/2009   LIBIDO, DECREASED 05/10/2009   Neuromuscular disorder ([place])    pt. denies  08/01/16   Osgood-Schlatter's disease    SLEEP APNEA, OBSTRUCTIVE 06/12/2009   CPAP not utilized after 30 lb. weight loss   SMOKER 06/12/2009   TESTICULAR HYPOFUNCTION 06/12/2009   WEIGHT GAIN 05/10/2009    Past Surgical History:  Procedure Laterality Date   ANKLE SURGERY Left     COLONOSCOPY  2014   normal   EYE SURGERY     eye implant   FRACTURE SURGERY     surgery on right ankle for ligament torn 2013   LASIK Bilateral    SHOULDER ARTHROSCOPY WITH DISTAL CLAVICLE RESECTION Right 02/26/2015   Procedure: SHOULDER DIAGNOSTIC ARTHROSCOPY WITH OPEN DISTAL CLAVICLE EXCISION;  Surgeon: Tania Ade, MD;  Location: [facility];  Service: Orthopedics;  Laterality: Right;  Right diagnostic arthroscopy, open distal clavical excision    Allergies  Allergen Reactions   Topamax [Topiramate]     Sleep walking, hallucinations, "I get mean, can't help it"    Family History  Problem Relation Age of Onset   Heart disease Maternal Grandfather    Irritable bowel syndrome Father    Alcoholism Father    Heart attack Father    Bipolar disorder Father    Colon cancer Neg Hx    Rectal cancer Neg Hx    Stomach cancer Neg Hx     Social History   Socioeconomic History   Marital status: Married    Spouse name: [name]   Number of children: 0   Years of education: 14   Highest education level: Not on file  Occupational History   Occupation: Unemployeed  Tobacco Use   Smoking status: Former Smoker    Packs/day: 0.50    Years: 15.00    Pack years: 7.50    Types: Cigarettes   Smokeless tobacco: Former Systems developer    Types: Chew    Quit date: 09/01/1998   Tobacco comment: tobacco info given 08/01/13  Vaping Use   Vaping Use: Never used  Substance and Sexual Activity   Alcohol use: Not Currently    Alcohol/week: 1.0 standard drink    Types: 1 Cans of beer per week    Comment: social, rare   Drug use: No   Sexual activity: Yes  Other Topics Concern   Not on file  Social History Narrative   Lives with wife at home   Caffeine use- tea, 3 glasses daily   Social Determinants of Health   Financial Resource Strain:    Difficulty of Paying Living Expenses: Not on file  Food Insecurity:    Worried About Charity fundraiser in  the Last Year: Not on file   [employer] of Food in the Last Year: Not on file  Transportation Needs:    Lack of Transportation (Medical): Not on file   Lack of Transportation (Non-Medical): Not on file  Physical Activity:    Days of Exercise per Week: Not on file   Minutes of Exercise per Session: Not on file  Stress:    Feeling of Stress : Not on file  Social Connections:    Frequency of Communication with Friends and Family: Not on file   Frequency of Social Gatherings with Friends and Family: Not on file   Attends Religious Services: Not on file   Active Member of Clubs or Organizations: Not on file   Attends Archivist Meetings: Not on file   Marital Status: Not on file   Vitals:   04/25/20 0829  BP: 130/80  Pulse: 95  Resp: 16  Temp: (!) 97.4 F (36.3 C)  SpO2: 98%   Body mass index is 33.24 kg/m.  Wt Readings from Last 3 Encounters:  04/25/20 217 lb (98.4 kg)  02/24/20 226 lb 8 oz (102.7 kg)  01/04/20 224 lb (101.6 kg)   Physical Exam Vitals and nursing note reviewed.  Constitutional:      General: He is not in acute distress.    Appearance: He is well-developed.  HENT:     Head: Normocephalic and atraumatic.     Right Ear: External ear normal. Tympanic membrane is not erythematous.     Left Ear: External ear normal. Tympanic membrane is not erythematous.     Ears:     Comments: Excess cerumen, can see TM partially, bilateral.    Mouth/Throat:     Mouth: Mucous membranes are moist.     Pharynx: Oropharynx is clear.  Eyes:     Extraocular Movements: Extraocular movements intact.     Conjunctiva/sclera: Conjunctivae normal.     Pupils: Pupils are equal, round, and reactive to light.  Neck:     Thyroid: No thyromegaly.     Trachea: No tracheal deviation.  Cardiovascular:     Rate and Rhythm: Normal rate and regular rhythm.     Pulses:          Dorsalis pedis pulses are 2+ on the right side and 2+ on the left side.     Heart sounds: No  murmur heard.   Pulmonary:     Effort: Pulmonary effort is normal. No respiratory distress.     Breath sounds: Normal breath sounds.  Abdominal:     Palpations: Abdomen is soft. There is no hepatomegaly or mass.     Tenderness: There is no abdominal tenderness.  Genitourinary:    Comments: No concerns. Musculoskeletal:        General: No tenderness.     Cervical back: Normal range of motion.     Comments: No major deformities appreciated and no signs of synovitis.  Lymphadenopathy:     Cervical: No cervical adenopathy.     Upper Body:     Right upper body: No supraclavicular adenopathy.     Left upper body: No supraclavicular adenopathy.  Skin:    General: Skin is warm.     Findings: No erythema.  Neurological:     General: No focal deficit present.     Mental Status: He is alert and oriented to person, place, and time.     Cranial Nerves: No cranial nerve deficit.     Sensory: No sensory deficit.     Coordination: Coordination normal.     Gait: Gait normal.     Deep Tendon Reflexes:     Reflex Scores:      Bicep reflexes are 2+ on the right side and 2+ on the left side.      Patellar reflexes are 2+ on the right side and 2+ on the left side. Psychiatric:        Mood and Affect: Mood and affect normal.   ASSESSMENT AND PLAN:  Mr.Kyrese was seen today for annual exam.  Diagnoses and all orders for this visit: Orders Placed This Encounter  Procedures   Lipid panel   COMPLETE METABOLIC PANEL WITH GFR   Hemoglobin A1c   Hepatitis C antibody screen    Lab Results  Component Value Date   HGBA1C 6.3 (H) 04/25/2020   Lab Results  Component Value Date   CHOL 295 (H)  04/25/2020   HDL 36 (L) 04/25/2020   LDLCALC  04/25/2020     Comment:     . LDL cholesterol not calculated. Triglyceride levels greater than 400 mg/dL invalidate calculated LDL results. . Reference range: <100 . Desirable range <100 mg/dL for primary prevention;   <70 mg/dL for patients with  CHD or diabetic patients  with > or = 2 CHD risk factors. Marland Kitchen LDL-C is now calculated using the Martin-Hopkins  calculation, which is a validated novel method providing  better accuracy than the Friedewald equation in the  estimation of LDL-C.  Cresenciano Genre et al. Annamaria Helling. 0626;948(54): 2061-2068  (http://education.[facility].com/faq/FAQ164)    LDLDIRECT 141.0 02/22/2016   TRIG 933 (H) 04/25/2020   CHOLHDL 8.2 (H) 04/25/2020    Routine general medical examination at a health care facility We discussed the importance of regular physical activity and healthy diet for prevention of chronic illness and/or complications. Preventive guidelines reviewed. Vaccination up to date.  Next CPE in a year.  The 10-year ASCVD risk score Mikey Bussing DC Brooke Bonito., et al., 2013) is: 18.9%   Values used to calculate the score:     Age: 5 years     Sex: Male     Is Non-Hispanic African American: No     Diabetic: No     Tobacco smoker: Yes     Systolic Blood Pressure: 627 mmHg     Is BP treated: No     HDL Cholesterol: 36 mg/dL     Total Cholesterol: 295 mg/dL  Hyperlipidemia, unspecified hyperlipidemia type Continue non pharmacologic treatment. Further recommendations according to lipid numbers and 10 years CVD risk score. - Lipid panel  Encounter for HCV screening test for low risk patient -     Hepatitis C antibody screen  Screening for endocrine, metabolic and immunity disorder -     Hemoglobin A1c   Return in about 1 year (around 04/25/2021) for cpe.   Keriann Rankin G. Martinique, MD  [facility]. [place] office.  A few things to remember from today's visit:   Routine general medical examination at a health care facility  Hyperlipidemia, unspecified hyperlipidemia type - Plan: Lipid panel, COMPLETE METABOLIC PANEL WITH GFR  Encounter for HCV screening test for low risk patient - Plan: Hepatitis C antibody screen  Screening for endocrine, metabolic and immunity disorder - Plan:  Hemoglobin A1c  Please be sure medication list is accurate. If a new problem present, please set up appointment sooner than planned today.   At least 150 minutes of moderate exercise per week, daily brisk walking for 15-30 min is a good exercise option. Healthy diet low in saturated (animal) fats and sweets and consisting of fresh fruits and vegetables, lean meats such as fish and white chicken and whole grains.  - Vaccines:  Tdap vaccine every 10 years.  Shingles vaccine recommended at age 33, could be given after 51 years of age but not sure about insurance coverage.  Pneumonia vaccines: Pneumovax at 2   -Screening recommendations for low/normal risk males:  Screening for diabetes at age 19 and every 3 years. Earlier screening if cardiovascular risk factors.   Lipid screening at 35 and every 3 years. Screening starts in younger males with cardiovascular risk factors.N/A  Colon cancer screening is now at age 77 but your insurance may not cover until age 4 .screening is recommended age 35.  Prostate cancer screening: some controversy, starts usually at 45: Rectal exam and PSA.  Aortic Abdominal Aneurism once between 65 and 75  years old if ever smoker.  Also recommended:  1. Dental visit- Brush and floss your teeth twice daily; visit your dentist twice a year. 2. Eye doctor- Get an eye exam at least every 2 years. 3. Helmet use- Always wear a helmet when riding a bicycle, motorcycle, rollerblading or skateboarding. 4. Safe sex- If you may be exposed to sexually transmitted infections, use a condom. 5. Seat belts- Seat belts can save your live; always wear one. 6. Smoke/Carbon Monoxide detectors- These detectors need to be installed on the appropriate level of your home. Replace batteries at least once a year. 7. Skin cancer- When out in the sun please cover up and use sunscreen 15 SPF or higher. 8. Violence- If anyone is threatening or hurting you, please tell your  healthcare provider.  9. Drink alcohol in moderation- Limit alcohol intake to one drink or less per day. Never drink and drive.

## 2020-04-25 NOTE — Patient Instructions (Addendum)
A few things to remember from today's visit:   Routine general medical examination at a health care facility  Hyperlipidemia, unspecified hyperlipidemia type - Plan: Lipid panel, COMPLETE METABOLIC PANEL WITH GFR  Encounter for HCV screening test for low risk patient - Plan: Hepatitis C antibody screen  Screening for endocrine, metabolic and immunity disorder - Plan: Hemoglobin A1c  Please be sure medication list is accurate. If a new problem present, please set up appointment sooner than planned today.   At least 150 minutes of moderate exercise per week, daily brisk walking for 15-30 min is a good exercise option. Healthy diet low in saturated (animal) fats and sweets and consisting of fresh fruits and vegetables, lean meats such as fish and white chicken and whole grains.  - Vaccines:  Tdap vaccine every 10 years.  Shingles vaccine recommended at age 27, could be given after 51 years of age but not sure about insurance coverage.  Pneumonia vaccines: Pneumovax at 29   -Screening recommendations for low/normal risk males:  Screening for diabetes at age 53 and every 3 years. Earlier screening if cardiovascular risk factors.   Lipid screening at 35 and every 3 years. Screening starts in younger males with cardiovascular risk factors.N/A  Colon cancer screening is now at age 24 but your insurance may not cover until age 89 .screening is recommended age 48.  Prostate cancer screening: some controversy, starts usually at 50: Rectal exam and PSA.  Aortic Abdominal Aneurism once between 27 and 61 years old if ever smoker.  Also recommended:  1. Dental visit- Brush and floss your teeth twice daily; visit your dentist twice a year. 2. Eye doctor- Get an eye exam at least every 2 years. 3. Helmet use- Always wear a helmet when riding a bicycle, motorcycle, rollerblading or skateboarding. 4. Safe sex- If you may be exposed to sexually transmitted infections, use a condom. 5. Seat  belts- Seat belts can save your live; always wear one. 6. Smoke/Carbon Monoxide detectors- These detectors need to be installed on the appropriate level of your home. Replace batteries at least once a year. 7. Skin cancer- When out in the sun please cover up and use sunscreen 15 SPF or higher. 8. Violence- If anyone is threatening or hurting you, please tell your healthcare provider.  9. Drink alcohol in moderation- Limit alcohol intake to one drink or less per day. Never drink and drive.

## 2020-04-26 LAB — LIPID PANEL
Cholesterol: 295 mg/dL — ABNORMAL HIGH (ref <200)
HDL: 36 mg/dL — ABNORMAL LOW (ref >=40)
Non-HDL Cholesterol (Calc): 259 mg/dL (calc) — ABNORMAL HIGH (ref <130)
Total CHOL/HDL Ratio: 8.2 (calc) — ABNORMAL HIGH (ref <5.0)
Triglycerides: 933 mg/dL — ABNORMAL HIGH (ref <150)

## 2020-04-26 LAB — COMPLETE METABOLIC PANEL WITH GFR
AG Ratio: 1.5 (calc) (ref 1.0–2.5)
ALT: 21 U/L (ref 9–46)
AST: 21 U/L (ref 10–35)
Albumin: 4.1 g/dL (ref 3.6–5.1)
Alkaline phosphatase (APISO): 86 U/L (ref 35–144)
BUN: 16 mg/dL (ref 7–25)
CO2: 25 mmol/L (ref 20–32)
Calcium: 9.3 mg/dL (ref 8.6–10.3)
Chloride: 103 mmol/L (ref 98–110)
Creat: 0.94 mg/dL (ref 0.70–1.33)
GFR, Est African American: 108 mL/min/{1.73_m2} (ref >=60)
GFR, Est Non African American: 93 mL/min/{1.73_m2} (ref >=60)
Globulin: 2.7 g/dL (calc) (ref 1.9–3.7)
Glucose, Bld: 157 mg/dL — ABNORMAL HIGH (ref 65–99)
Potassium: 4.2 mmol/L (ref 3.5–5.3)
Sodium: 136 mmol/L (ref 135–146)
Total Bilirubin: 0.4 mg/dL (ref 0.2–1.2)
Total Protein: 6.8 g/dL (ref 6.1–8.1)

## 2020-04-26 LAB — HEMOGLOBIN A1C
Hgb A1c MFr Bld: 6.3 % of total Hgb — ABNORMAL HIGH (ref <5.7)
Mean Plasma Glucose: 134 (calc)
eAG (mmol/L): 7.4 (calc)

## 2020-04-30 ENCOUNTER — Other Ambulatory Visit: Payer: Self-pay

## 2020-04-30 DIAGNOSIS — F3131 Bipolar disorder, current episode depressed, mild: Secondary | ICD-10-CM

## 2020-04-30 DIAGNOSIS — F4010 Social phobia, unspecified: Secondary | ICD-10-CM

## 2020-04-30 MED ORDER — ALPRAZOLAM 1 MG PO TABS: ORAL_TABLET | ORAL | 0 refills | Status: DC

## 2020-04-30 NOTE — Telephone Encounter (Signed)
[place] registry logs the Xanax 1 mg three times daily as two separate entities on Jun 15 and 18 no 90-day supply for 1 month daily in a 90-day supply for two daily that he takes three daily as needed by last direction.  Patient having bipolar, social anxiety, and panic disorder undergoing medical changes from new primary care and full mouth dental implants overwhelmed.  The pharmacy is 2 weeks early requesting the 90-day refill which is however clinically appropriate and needed.

## 2020-05-01 ENCOUNTER — Telehealth: Payer: Self-pay | Admitting: Family Medicine

## 2020-05-01 ENCOUNTER — Other Ambulatory Visit: Payer: Self-pay

## 2020-05-01 MED ORDER — ATORVASTATIN CALCIUM 40 MG PO TABS: 40.0000 mg | ORAL_TABLET | Freq: Every day | ORAL | 5 refills | Status: DC

## 2020-05-01 NOTE — Telephone Encounter (Signed)
Pt returned your call and want a call back. 

## 2020-05-01 NOTE — Telephone Encounter (Signed)
See result note. Left pt a voicemail to call back.

## 2020-05-03 ENCOUNTER — Other Ambulatory Visit: Payer: Self-pay | Admitting: Psychiatry

## 2020-05-03 DIAGNOSIS — F3131 Bipolar disorder, current episode depressed, mild: Secondary | ICD-10-CM

## 2020-05-05 ENCOUNTER — Encounter: Payer: Self-pay | Admitting: Family Medicine

## 2020-05-15 ENCOUNTER — Ambulatory Visit: Payer: BC Managed Care – PPO | Admitting: Psychiatry

## 2020-05-23 ENCOUNTER — Other Ambulatory Visit: Payer: Self-pay | Admitting: Nurse Practitioner

## 2020-06-04 ENCOUNTER — Telehealth: Payer: Self-pay | Admitting: Psychiatry

## 2020-06-04 DIAGNOSIS — F3131 Bipolar disorder, current episode depressed, mild: Secondary | ICD-10-CM

## 2020-06-04 DIAGNOSIS — F451 Undifferentiated somatoform disorder: Secondary | ICD-10-CM

## 2020-06-04 DIAGNOSIS — G4733 Obstructive sleep apnea (adult) (pediatric): Secondary | ICD-10-CM

## 2020-06-04 DIAGNOSIS — F4010 Social phobia, unspecified: Secondary | ICD-10-CM

## 2020-06-04 MED ORDER — ZOLPIDEM TARTRATE 10 MG PO TABS: 10.0000 mg | ORAL_TABLET | Freq: Every day | ORAL | 0 refills | Status: DC

## 2020-06-04 NOTE — Telephone Encounter (Signed)
Pt called and left a message that he needs a refill on his ambien 10 mg to be [facility] to [facility] at [address]

## 2020-06-04 NOTE — Telephone Encounter (Signed)
Last  appointment June 15 being no-show in the interim now scheduled for October 6 for onsite appointment with last 90-day supply of Ambien 10 mg nightly dispensed 03/06/2020 now due sending 30-day supply to cover until appointment medically necessary no contraindication to [facility] [address]

## 2020-06-04 NOTE — Telephone Encounter (Signed)
Pt made appt for 06/06/20

## 2020-06-04 NOTE — Telephone Encounter (Signed)
Has apt on 06/06/20

## 2020-06-06 ENCOUNTER — Encounter: Payer: Self-pay | Admitting: Psychiatry

## 2020-06-06 ENCOUNTER — Ambulatory Visit (INDEPENDENT_AMBULATORY_CARE_PROVIDER_SITE_OTHER): Payer: BC Managed Care – PPO | Admitting: Psychiatry

## 2020-06-06 ENCOUNTER — Other Ambulatory Visit: Payer: Self-pay

## 2020-06-06 VITALS — Ht 67.75 in | Wt 208.0 lb

## 2020-06-06 DIAGNOSIS — F4010 Social phobia, unspecified: Secondary | ICD-10-CM

## 2020-06-06 DIAGNOSIS — F3131 Bipolar disorder, current episode depressed, mild: Secondary | ICD-10-CM

## 2020-06-06 DIAGNOSIS — F451 Undifferentiated somatoform disorder: Secondary | ICD-10-CM

## 2020-06-06 DIAGNOSIS — G4733 Obstructive sleep apnea (adult) (pediatric): Secondary | ICD-10-CM

## 2020-06-06 DIAGNOSIS — Z9989 Dependence on other enabling machines and devices: Secondary | ICD-10-CM

## 2020-06-06 DIAGNOSIS — F410 Panic disorder [episodic paroxysmal anxiety] without agoraphobia: Secondary | ICD-10-CM

## 2020-06-06 MED ORDER — ZOLPIDEM TARTRATE 10 MG PO TABS: 10.0000 mg | ORAL_TABLET | Freq: Every day | ORAL | 1 refills | Status: DC

## 2020-06-06 MED ORDER — QUETIAPINE FUMARATE 200 MG PO TABS: 200.0000 mg | ORAL_TABLET | Freq: Two times a day (BID) | ORAL | 1 refills | Status: DC

## 2020-06-06 MED ORDER — LAMOTRIGINE 150 MG PO TABS: 300.0000 mg | ORAL_TABLET | Freq: Every day | ORAL | 1 refills | Status: DC

## 2020-06-06 MED ORDER — ALPRAZOLAM 1 MG PO TABS: 1.0000 mg | ORAL_TABLET | Freq: Three times a day (TID) | ORAL | 1 refills | Status: DC | PRN

## 2020-06-06 NOTE — Progress Notes (Signed)
[facility]  Patient ID: Sean Wu,  MRN: [id number]  PCP: Martinique, Betty G, MD  Date of Evaluation: 06/06/2020 Time spent:20 minutes from 1005 to 1025  Chief Complaint:  Chief Complaint    Depression; Manic Behavior; Anxiety; Panic Attack      HISTORY/CURRENT STATUS: Sean Wu is seen onsite in office 20 minutes face-to-face individually with consent with Epic collateral for psychiatric interview and exam in 54-month evaluation and management of bipolar depression, social anxiety and panic disorder, somatic symptom disorder, and obstructive sleep apnea.  He just forgot his appointment last month but contacted the office to get a 30-day supply of Ambien in the interim.  Otherwise he is low on his medication supply taking the Seroquel either 200 mg or 400 mg nightly depending upon sleeplessness as well as causes of such including bipolar, panic and social anxiety.  He takes the Xanax between 0 and 3 daily usually at least a couple, but  he has reduced his weight to help the obstructive sleep apnea down from 222 to 208 pounds which he largely attributes to stopping [facility] regular and going to the gym every day which helps him though he has to take a Xanax around attending the gym at times for social anxiety.  His mother in [place] is now separated from her 89rd friend who has become demented as one is missing and deceased. She has texted patient 4 times this morning before 10 AM showing him her breakfast and her purchases at the grocery yesterday.  Patient's wife encourages such while the patient becomes overwhelmed fearing that mother will move to [place] as she so lonely in [place] now.  Wife is to return to a hybrid work schedule at the office in January patient expecting more [place].  He feels blah himself somewhat depressed but not severely but overall bored.  His lower dental implants do not fit and must be replaced though the upper are doing well, but these are an  ongoing source of stress over the last 6 months.  [place] registry documents a 30-day supply of Ambien dispensed October 4 and last Xanax dispensed August 30 for the 3-month supply.  His medications are much less complex than in the past down to 4 medicines with 2 having variable dosing so that he anticipates changeover to another provider to be complex as I retire.  However, overall he states his mental status is quietly boring and thereby quite stable except for his mild depression.  He is not manic, psychotic, delirious or suicidal.  Depression The patient presents withbipolardepressionas arecurrentproblemmost recentepisode starting 10months ago. The onset quality is sudden. The problem occurs intermittently.The problem has been waxing and waningsince onset.Associated symptomsinclude oversensitivity,insomnia,irritability,appetite change,morbid cognitive fixations, social withdrawal, decreased interest, boredom,andreactivesadness. Associated symptoms include no fatigue, nodecreased concentration,no, no helplessness,no hopelessness, noheadaches,noindigestion,and no suicidal ideas.The symptoms are aggravated by social issues and family issues.Past treatments include SSRIs - Selective serotonin reuptake inhibitors, SNRIs - Serotonin and norepinephrine reuptake inhibitors and other medications.Compliance with treatment isfair.Past compliance problems include medical issues, difficulty with treatment plan and medication issues.Previous treatment provided moderaterelief.Risk factors include a change in medication usage/dosage, family history, family history of mental illness, history of mental illness, major life event, substance abuse and stress. Past medical history includes chronic illness,anxiety,depression,bipolardisorder,mental health disorderand head trauma. Pertinent negatives include no recent illness,no life-threatening condition,no recent  psychiatric admission,no eating disorder,no obsessive-compulsive disorder,no post-traumatic stress disorder,no schizophreniaand no suicide attempts.   Individual Medical History/ Review of Systems: Changes? :Yes Having general  medical exam August 25 then 2 months after follow-up for gastroparesis with PCP having reduced weight 9 pounds over that 2 months now down another 9 pounds since that appointment stopping [facility] and starting Lipitor 40 mg daily.  Ref Range & Units 1 mo ago  (04/25/20) 3 yr ago  (03/31/17) 4 yr ago  (02/22/16) 4 yr ago  (02/22/16)  Glucose, Bld 65 - 99 mg/dL 157High  100High R   92 R   Comment: .       Fasting reference interval  .  For someone without known diabetes, a glucose  value >125 mg/dL indicates that they may have  diabetes and this should be confirmed with a  follow-up test.  .   BUN 7 - 25 mg/dL 16  10 R   10 R   Creat 0.70 - 1.33 mg/dL 0.94  0.90 R   1.01 R   Comment: For patients >7 years of age, the reference limit  for Creatinine is approximately 13% higher for people  identified as African-American.  .   GFR, Est Non African American > OR = 60 mL/min/1.62m2 93      GFR, Est African American > OR = 60 mL/min/1.43m2 108      BUN/Creatinine Ratio 6 - 22 (calc) NOT APPLICABLE      Sodium 673 - 146 mmol/L 136  136 R   141 R   Potassium 3.5 - 5.3 mmol/L 4.2  4.5 R   3.7 R   Chloride 98 - 110 mmol/L 103  100 R   109 R   CO2 20 - 32 mmol/L 25  30 R   23 R   Calcium 8.6 - 10.3 mg/dL 9.3  9.4 R   9.1 R   Total Protein 6.1 - 8.1 g/dL 6.8  7.0 R  6.3 R    Albumin 3.6 - 5.1 g/dL 4.1      Globulin 1.9 - 3.7 g/dL (calc) 2.7      AG Ratio 1.0 - 2.5 (calc) 1.5      Total Bilirubin 0.2 - 1.2 mg/dL 0.4  0.4  0.4    Alkaline phosphatase (APISO) 35 - 144 U/L 86      AST 10 - 35 U/L 21  10 R  16 R    ALT 9 - 46 U/L 21  9 R  15 R      Ref Range & Units 1 mo ago 4 yr ago 11 yr ago  Cholesterol <200 mg/dL 295High  240High R, CM  219High  R, CM   HDL > OR = 40 mg/dL 36Low  40.30 R  48.80 R   Triglycerides <150 mg/dL 933High  226.0High R, CM  272.0High R, CM   Comment: .  If a non-fasting specimen was collected, consider  repeat triglyceride testing on a fasting specimen  if clinically indicated.  Yates Decamp et al. J. of Clin. Lipidol. 4193;7:902-409.  .  .  There is increased risk of pancreatitis when the  triglyceride concentration is very high  (> or = 500 mg/dL, especially if > or = 1000 mg/dL).  Yates Decamp et al. J. of Clin. Lipidol. 7353;2:992-426.  Marland Kitchen   LDL Cholesterol (Calc) mg/dL (calc)     Comment: .  LDL cholesterol not calculated. Triglyceride levels  greater than 400 mg/dL invalidate calculated LDL results.  .  Reference range: <100  .  Desirable range <100 mg/dL for primary prevention;   <70 mg/dL for patients with CHD or diabetic  patients  with > or = 2 CHD risk factors.  Marland Kitchen  LDL-C is now calculated using the Martin-Hopkins  calculation, which is a validated novel method providing  better accuracy than the Friedewald equation in the  estimation of LDL-C.  Cresenciano Genre et al. Annamaria Helling. 8144;818(56): 2061-2068  (http://education.[facility].com/faq/FAQ164)   Total CHOL/HDL Ratio <5.0 (calc) 8.2High  6 R, CM  4 R, CM   Non-HDL Cholesterol (Calc) <130 mg/dL (calc) 259High     Comment: Non-HDL level > or = 220 is very high and may indicate  genetic familial hypercholesterolemia (FH). Clinical  assessment and measurement of blood lipid levels  should be considered for all first-degree relatives  of patients with an FH diagnosis.  .  For patients with diabetes plus 1 major ASCVD risk  factor, treating to a non-HDL-C goal of <100 mg/dL  (LDL-C of <70 mg/dL) is considered a therapeutic  option.   Resulting Agency       240 (H) 02/22/2016     HDL 40.30 02/22/2016   [facility] 141.0 02/22/2016   TRIG 226.0 (H) 02/22/2016   CHOLHDL 6 02/22/2016   Hemoglobin A1C Hemoglobin A1c Collected:  04/25/20 0851  Result status: Final  Resulting lab: [facility]  Reference range: <5.7 % of total Hgb  Value: 6.3High     Allergies: Topamax [topiramate]  Current Medications:  Current Outpatient Medications:  .  ALPRAZolam (XANAX) 1 MG tablet, Take 1 tablet (1 mg total) by mouth 3 (three) times daily as needed for anxiety (Panic attack;  agitation)., Disp: 270 tablet, Rfl: 1 .  AMITIZA 24 MCG capsule, TAKE 1 CAPSULE BY MOUTH TWICE A DAY WITH A MEAL, Disp: 60 capsule, Rfl: 3 .  atorvastatin (LIPITOR) 40 MG tablet, Take 1 tablet (40 mg total) by mouth daily., Disp: 30 tablet, Rfl: 5 .  lamoTRIgine (LAMICTAL) 150 MG tablet, Take 2 tablets (300 mg total) by mouth at bedtime., Disp: 180 tablet, Rfl: 1 .  metoCLOPramide (REGLAN) 5 MG tablet, TAKE 1 TABLET (5 MG TOTAL) BY MOUTH 3 (THREE) TIMES DAILY BEFORE MEALS., Disp: 90 tablet, Rfl: 1 .  prochlorperazine (COMPAZINE) 10 MG tablet, Take 1 tablet (10 mg total) by mouth daily as needed., Disp: 30 tablet, Rfl: 0 .  QUEtiapine (SEROQUEL) 200 MG tablet, Take 1 tablet (200 mg total) by mouth 2 (two) times daily., Disp: 180 tablet, Rfl: 1 .  zolpidem (AMBIEN) 10 MG tablet, Take 1 tablet (10 mg total) by mouth at bedtime., Disp: 90 tablet, Rfl: 1  Medication Side Effects: weight gain  Family Medical/ Social History: Changes? No  MENTAL HEALTH EXAM:  Height 5' 7.75" (1.721 m), weight 208 lb (94.3 kg).Body mass index is 31.86 kg/m. Muscle strengths and tone 5/5, postural reflexes and gait 0/0, and AIMS = 0.  General Appearance: Casual, Fairly Groomed, Guarded, Meticulous and Obese  Eye Contact:  Fair  Speech:  Clear and Coherent, Normal Rate and Talkative  Volume:  Normal  Mood:  Anxious, Depressed and Dysphoric  Affect:  Congruent, Depressed, Inappropriate, Labile, Full Range and Anxious  Thought Process:  Coherent, Goal Directed, Irrelevant and Descriptions of Associations: Tangential  Orientation:  Full (Time, Place, and Person)  Thought Content:  Logical, Ilusions, Rumination and Tangential   Suicidal Thoughts:  No  Homicidal Thoughts:  No  Memory:  Immediate;   Good Remote;   Good  Judgement:  Fair  Insight:  Fair  Psychomotor Activity:  Normal, Mannerisms and Restlessness  Concentration:  Concentration: Fair and Attention Span: Good  Recall:  Smiley Houseman of Knowledge: Good  Language: Good  Assets:  Desire for Improvement Intimacy Resilience Talents/Skills  ADL's:  Intact  Cognition: WNL  Prognosis:  Fair    DIAGNOSES:    ICD-10-CM   1. Bipolar I disorder, mild, current or most recent episode depressed, with mixed features (HCC)  F31.31 ALPRAZolam (XANAX) 1 MG tablet    lamoTRIgine (LAMICTAL) 150 MG tablet    QUEtiapine (SEROQUEL) 200 MG tablet    zolpidem (AMBIEN) 10 MG tablet  2. Social anxiety disorder  F40.10 ALPRAZolam (XANAX) 1 MG tablet    QUEtiapine (SEROQUEL) 200 MG tablet    zolpidem (AMBIEN) 10 MG tablet  3. Panic disorder  F41.0 ALPRAZolam (XANAX) 1 MG tablet    QUEtiapine (SEROQUEL) 200 MG tablet  4. Somatic symptom disorder  F45.1 lamoTRIgine (LAMICTAL) 150 MG tablet    zolpidem (AMBIEN) 10 MG tablet  5. OSA on CPAP  G47.33 zolpidem (AMBIEN) 10 MG tablet   Z99.89     Receiving Psychotherapy: No    RECOMMENDATIONS: Psychosupportive psychoeducation reworks the several year attempt to reduce the patient's mental health medications especially his Seroquel which he compares is much better than when he used to take 800 mg nightly.  Substance use has not been a recent concern in having a positive cocaine on UDS 2018 after attending a neighbor's party but which he states was an isolated concern otherwise he exercises daily containing impulse dyscontrol.  He still requires the Seroquel between 200 mg and 400 mg nightly along with the Ambien at night and Xanax in the day to keep his anxiety and agitated function stable.  Lamictal keeps mood reasonably stable along with the Seroquel.  He is E scribed a refill on  his 90-day supply at this time of change over from closure of care for my retirement to see advanced practitioner in the office in 3 months by transition transfer while also working more closely with his PCP Betty Martinique including starting Lipitor.  He is E scribed Seroquel 200 mg taking 1 pill nightly or when needed 2 daily predominantly at night as #180 with 1 refill to [facility] [place] for bipolar, panic, and social anxiety.  He is E scribed Xanax as his other variable medication as 1 mg 3 times daily as needed sent as #270 with 1 refill to [facility] [place] for social and panic anxiety and bipolar agitation.  He is E scribed Ambien 10 mg nightly #90 with 1 refill sent to  [facility] [place] for insomnia bipolar, panic and social anxiety disorders.  He is E scribed Lamictal 150 mg IR tablet taking 2 tablets every bedtime sent as #180 with 1 refill for bipolar disorder to  [facility] [place]. Follow-up will be in 3 months though he is often overdue by 1 to several months with closure of care updating prevention and monitoring safety hygiene.   Delight Hoh, MD

## 2020-06-21 ENCOUNTER — Encounter: Payer: Self-pay | Admitting: Psychiatry

## 2020-07-05 ENCOUNTER — Other Ambulatory Visit: Payer: Self-pay | Admitting: Family Medicine

## 2020-07-05 ENCOUNTER — Other Ambulatory Visit: Payer: Self-pay | Admitting: Psychiatry

## 2020-07-05 DIAGNOSIS — F451 Undifferentiated somatoform disorder: Secondary | ICD-10-CM

## 2020-07-05 DIAGNOSIS — K3184 Gastroparesis: Secondary | ICD-10-CM

## 2020-07-05 DIAGNOSIS — R110 Nausea: Secondary | ICD-10-CM

## 2020-07-11 NOTE — Telephone Encounter (Signed)
Not sure if patient should be on?

## 2020-07-11 NOTE — Telephone Encounter (Signed)
Last E scribed in late spring 2021 when changing primary providers attempting to disengage from somatoform overuse of prochlorperazine now started again requesting small amount of  ondansetron medication for that purpose as he resolves to work more assentively with all other care.

## 2020-07-25 ENCOUNTER — Telehealth: Payer: Self-pay | Admitting: Psychiatry

## 2020-07-25 DIAGNOSIS — F410 Panic disorder [episodic paroxysmal anxiety] without agoraphobia: Secondary | ICD-10-CM

## 2020-07-25 DIAGNOSIS — F3131 Bipolar disorder, current episode depressed, mild: Secondary | ICD-10-CM

## 2020-07-25 DIAGNOSIS — F4010 Social phobia, unspecified: Secondary | ICD-10-CM

## 2020-07-25 NOTE — Telephone Encounter (Signed)
Pt needs a new rx for Xanax to be [facility] [place] said that they do not have a refill on file.

## 2020-07-26 ENCOUNTER — Other Ambulatory Visit: Payer: Self-pay | Admitting: Family Medicine

## 2020-07-26 ENCOUNTER — Other Ambulatory Visit: Payer: Self-pay | Admitting: Psychiatry

## 2020-07-26 DIAGNOSIS — F3131 Bipolar disorder, current episode depressed, mild: Secondary | ICD-10-CM

## 2020-07-26 DIAGNOSIS — F410 Panic disorder [episodic paroxysmal anxiety] without agoraphobia: Secondary | ICD-10-CM

## 2020-07-26 DIAGNOSIS — R110 Nausea: Secondary | ICD-10-CM

## 2020-07-26 DIAGNOSIS — F4010 Social phobia, unspecified: Secondary | ICD-10-CM

## 2020-07-26 DIAGNOSIS — F451 Undifferentiated somatoform disorder: Secondary | ICD-10-CM

## 2020-07-30 MED ORDER — ALPRAZOLAM 1 MG PO TABS: 1.0000 mg | ORAL_TABLET | Freq: Three times a day (TID) | ORAL | 1 refills | Status: DC | PRN

## 2020-07-30 NOTE — Telephone Encounter (Signed)
Sean Wu called again to say that the pharmacy told him again that they do not have any prescriptions or refills on fill for his Xanax.  They said they sent a request and said they have not received a response.  He said last time he filled the prescription was in August.  Please send in new prescription.  It does look like we denied the request from the pharmacy but is probably because they should have the 06/06/20 prescription.

## 2020-07-30 NOTE — Telephone Encounter (Signed)
Looks like refill sent over for #270 with refill on 06/26/20

## 2020-07-30 NOTE — Telephone Encounter (Signed)
Patient and pharmacy maintain the pharmacy never got 06/06/2020 Xanax escription and patient never received any of that Xanax, confirmed by [place] registry last Xanax 04/30/2020 though epic records receipt confirmed by pharmacy now again sent to them as replacement escription

## 2020-09-06 ENCOUNTER — Ambulatory Visit: Payer: BC Managed Care – PPO | Admitting: Psychiatry

## 2020-09-29 ENCOUNTER — Encounter (INDEPENDENT_AMBULATORY_CARE_PROVIDER_SITE_OTHER): Payer: Self-pay

## 2020-10-01 ENCOUNTER — Other Ambulatory Visit: Payer: Self-pay | Admitting: Family Medicine

## 2020-10-02 ENCOUNTER — Telehealth: Payer: Self-pay | Admitting: Psychiatry

## 2020-10-02 DIAGNOSIS — F3131 Bipolar disorder, current episode depressed, mild: Secondary | ICD-10-CM

## 2020-10-02 DIAGNOSIS — F4010 Social phobia, unspecified: Secondary | ICD-10-CM

## 2020-10-02 DIAGNOSIS — F410 Panic disorder [episodic paroxysmal anxiety] without agoraphobia: Secondary | ICD-10-CM

## 2020-10-02 MED ORDER — QUETIAPINE FUMARATE 200 MG PO TABS: 200.0000 mg | ORAL_TABLET | Freq: Two times a day (BID) | ORAL | 1 refills | Status: DC

## 2020-10-02 NOTE — Telephone Encounter (Signed)
Received faxed refill request from pharmacy for Seroquel. Script sent.

## 2020-10-04 ENCOUNTER — Telehealth: Payer: Self-pay | Admitting: Psychiatry

## 2020-10-04 NOTE — Telephone Encounter (Signed)
Nat called

## 2020-10-11 ENCOUNTER — Other Ambulatory Visit: Payer: Self-pay | Admitting: Psychiatry

## 2020-10-11 DIAGNOSIS — F410 Panic disorder [episodic paroxysmal anxiety] without agoraphobia: Secondary | ICD-10-CM

## 2020-10-11 DIAGNOSIS — F4010 Social phobia, unspecified: Secondary | ICD-10-CM

## 2020-10-11 DIAGNOSIS — F3131 Bipolar disorder, current episode depressed, mild: Secondary | ICD-10-CM

## 2020-11-20 ENCOUNTER — Other Ambulatory Visit: Payer: Self-pay | Admitting: Psychiatry

## 2020-11-20 DIAGNOSIS — F451 Undifferentiated somatoform disorder: Secondary | ICD-10-CM

## 2020-11-20 DIAGNOSIS — F410 Panic disorder [episodic paroxysmal anxiety] without agoraphobia: Secondary | ICD-10-CM

## 2020-11-20 DIAGNOSIS — F4010 Social phobia, unspecified: Secondary | ICD-10-CM

## 2020-11-20 DIAGNOSIS — F3131 Bipolar disorder, current episode depressed, mild: Secondary | ICD-10-CM

## 2020-11-23 ENCOUNTER — Ambulatory Visit (INDEPENDENT_AMBULATORY_CARE_PROVIDER_SITE_OTHER): Payer: BC Managed Care – PPO | Admitting: Psychiatry

## 2020-11-23 ENCOUNTER — Other Ambulatory Visit: Payer: Self-pay

## 2020-11-23 ENCOUNTER — Encounter: Payer: Self-pay | Admitting: Psychiatry

## 2020-11-23 DIAGNOSIS — F3131 Bipolar disorder, current episode depressed, mild: Secondary | ICD-10-CM

## 2020-11-23 DIAGNOSIS — Z9989 Dependence on other enabling machines and devices: Secondary | ICD-10-CM

## 2020-11-23 DIAGNOSIS — F4010 Social phobia, unspecified: Secondary | ICD-10-CM | POA: Diagnosis not present

## 2020-11-23 DIAGNOSIS — F410 Panic disorder [episodic paroxysmal anxiety] without agoraphobia: Secondary | ICD-10-CM | POA: Diagnosis not present

## 2020-11-23 DIAGNOSIS — G2581 Restless legs syndrome: Secondary | ICD-10-CM

## 2020-11-23 DIAGNOSIS — F451 Undifferentiated somatoform disorder: Secondary | ICD-10-CM

## 2020-11-23 DIAGNOSIS — G4733 Obstructive sleep apnea (adult) (pediatric): Secondary | ICD-10-CM

## 2020-11-23 MED ORDER — ALPRAZOLAM 1 MG PO TABS: 1.0000 mg | ORAL_TABLET | Freq: Three times a day (TID) | ORAL | 1 refills | Status: DC | PRN

## 2020-11-23 MED ORDER — QUETIAPINE FUMARATE 200 MG PO TABS: 200.0000 mg | ORAL_TABLET | Freq: Two times a day (BID) | ORAL | 0 refills | Status: DC

## 2020-11-23 MED ORDER — ZOLPIDEM TARTRATE 10 MG PO TABS
10.0000 mg | ORAL_TABLET | Freq: Every day | ORAL | 0 refills | Status: DC
Start: 2020-12-24 — End: 2021-03-28

## 2020-11-23 MED ORDER — GABAPENTIN 300 MG PO CAPS: ORAL_CAPSULE | ORAL | 2 refills | Status: DC

## 2020-11-23 MED ORDER — LAMOTRIGINE 150 MG PO TABS: 300.0000 mg | ORAL_TABLET | Freq: Every day | ORAL | 1 refills | Status: DC

## 2020-11-23 NOTE — Progress Notes (Signed)
   11/23/20 0939  Facial and Oral Movements  Muscles of Facial Expression 0  Lips and Perioral Area 0  Jaw 0  Tongue 0  Extremity Movements  Upper (arms, wrists, hands, fingers) 1  Lower (legs, knees, ankles, toes) 0  Trunk Movements  Neck, shoulders, hips 0  Overall Severity  Severity of abnormal movements (highest score from questions above) 0  Incapacitation due to abnormal movements 0  Patient's awareness of abnormal movements (rate only patient's report) 0  Dental Status  Current problems with teeth and/or dentures?  (Implants.)  AIMS Total Score  AIMS Total Score 1

## 2020-11-23 NOTE — Progress Notes (Signed)
[name] [id number] 21-May-1971 50 y.o.  Subjective:   Patient ID:  Sean Wu is a 50 y.o. (DOB September 22, 1970) male.  Chief Complaint:  Chief Complaint  Patient presents with  . Follow-up    H/o mood disturbance, anxiety, and insomnia    HPI Sean Wu presents to the office today for follow-up of Bipolar D/O, anxiety, and insomnia. He reports h/o Bipolar d/o and reports that his mood has been stable for awhile. He reports that he has had more depressive episodes in his lifetime.   He reports that he has significant anxiety. He reports generalized anxiety and social anxiety. He reports constant anxiety at baseline. He reports infrequent panic attacks. He reports that he has increased anxiety and frustration with GI s/s. He will get antsy at times and has difficulty sitting still.   He reports that sleep is "off and on." Difficulty falling and staying asleep. He reports weird dreams and not nightmares. He reports that he has restless legs since his 20's. Sleep amount varies from 3-9 hours. He reports that he sleeps about 4-5 hours a night on average. Appetite varies due to gastroparesis. Denies difficulty with concentration. Denies SI.   He reports that he frequently feel bored. He reports energy and motivation have been low. Has not been to the gym recently.   He reports that his mother causes him anxiety. He reports that she is currently visiting. He reports that he has anxiety leading up to mother's visit. He reports that mother is easily bored and he feels that he needs to entertain her. Mother currently lives in [place], [place] and is talking about moving back to [place]. Mother has been having some memory issues. Wife works from home and will be returning to the office in about a month. 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 He reports that he used to go to the gym and this was helpful for him. He reports that wife is supportive and they do not have any  children. He enjoys sports.   He reports taking Xanax prn at least twice daily most days.  Past Psychiatric Medication Trials: Seroquel- Helpful most nights for sleep.  Saphris Rexulti Ambien- Uses prn. Reports that it works 8 out 10 nights Belsomra-Ineffective Topamax-Adverse reaction Lamictal- Helpful for mood Gabapentin Depakote- Wt gain Cymbalta Paxil Xanax -helpful for anxiety Clonidine Librium Trazodone   [place] Office Visit from 11/23/2020 in [place] Total Score [facility] Visit from 01/16/2015 in [facility] [place]  Total Score (max 30 points ) 29    PHQ2-9   [place] Visit from 02/22/2016 in [facility] at [place]  PHQ-2 Total Score 1       Review of Systems:  Review of Systems  Gastrointestinal: Positive for constipation.  Musculoskeletal: Positive for arthralgias. Negative for gait problem.  Neurological: Negative for tremors.       RLS  Psychiatric/Behavioral:       Please refer to HPI    Medications: I have reviewed the patient's current medications.  Current Outpatient Medications  Medication Sig Dispense Refill  . atorvastatin (LIPITOR) 40 MG tablet TAKE 1 TABLET BY MOUTH EVERY DAY 90 tablet 3  . gabapentin (NEURONTIN) 300 MG capsule Take 1-3 capsules about 2-3 hours before bedtime 90 capsule 2  . metoCLOPramide (REGLAN) 5 MG tablet TAKE 1 TABLET (5 MG TOTAL) BY MOUTH 3 (THREE) TIMES  DAILY BEFORE MEALS. 90 tablet 1  . prochlorperazine (COMPAZINE) 10 MG tablet TAKE 1 TABLET BY MOUTH EVERY DAY AS NEEDED 30 tablet 2  . [START ON 01/17/2021] ALPRAZolam (XANAX) 1 MG tablet Take 1 tablet (1 mg total) by mouth 3 (three) times daily as needed for anxiety (Panic attack;  agitation). 270 tablet 1  . AMITIZA 24 MCG capsule TAKE 1 CAPSULE BY MOUTH TWICE A DAY WITH A MEAL 60 capsule 3  . lamoTRIgine (LAMICTAL) 150 MG tablet Take 2 tablets (300 mg total) by mouth  at bedtime. 180 tablet 1  . ondansetron (ZOFRAN) 4 MG tablet TAKE 1 TABLET TWICE DAILY AS NEEDED FOR NAUSEA OR VOMITING. 30 tablet 0  . QUEtiapine (SEROQUEL) 200 MG tablet Take 1 tablet (200 mg total) by mouth 2 (two) times daily. 180 tablet 0  . [START ON 12/24/2020] zolpidem (AMBIEN) 10 MG tablet Take 1 tablet (10 mg total) by mouth at bedtime. 90 tablet 0   No current facility-administered medications for this visit.    Medication Side Effects: Other: Excessive daytime somnolence at times with Seroquel   Allergies:  Allergies  Allergen Reactions  . Topamax [Topiramate]     Sleep walking, hallucinations, "I get mean, can't help it"    Past Medical History:  Diagnosis Date  . ALLERGIC RHINITIS 05/10/2009  . Allergy    anemia  . Anxiety   . Bipolar disorder ([place])    see medications patient to bring  . Chronic headaches   . DDD (degenerative disc disease)   . DEPRESSION 05/10/2009  . Gastroparesis   . GERD 05/10/2009  . LIBIDO, DECREASED 05/10/2009  . Neuromuscular disorder ([place])    pt. denies 08/01/16  . Osgood-Schlatter's disease   . SLEEP APNEA, OBSTRUCTIVE 06/12/2009   CPAP not utilized after 30 lb. weight loss  . SMOKER 06/12/2009  . TESTICULAR HYPOFUNCTION 06/12/2009  . WEIGHT GAIN 05/10/2009    Family History  Problem Relation Age of Onset  . Heart disease Maternal Grandfather   . Irritable bowel syndrome Father   . Alcoholism Father   . Heart attack Father   . Bipolar disorder Father   . Colon cancer Neg Hx   . Rectal cancer Neg Hx   . Stomach cancer Neg Hx     Social History   Socioeconomic History  . Marital status: Married    Spouse name: [name]  . Number of children: 0  . Years of education: 45  . Highest education level: Not on file  Occupational History  . Occupation: Unemployeed  Tobacco Use  . Smoking status: Former Smoker    Packs/day: 0.50    Years: 15.00    Pack years: 7.50    Types: Cigarettes  . Smokeless tobacco: Former Systems developer    Types:  Chew    Quit date: 09/01/1998  . Tobacco comment: tobacco info given 08/01/13  Vaping Use  . Vaping Use: Never used  Substance and Sexual Activity  . Alcohol use: Not Currently    Alcohol/week: 1.0 standard drink    Types: 1 Cans of beer per week    Comment: social, rare  . Drug use: No  . Sexual activity: Yes  Other Topics Concern  . Not on file  Social History Narrative   Lives with wife at home   Caffeine use- tea, 3 glasses daily   Social Determinants of Health   Financial Resource Strain: Not on file  Food Insecurity: Not on file  Transportation Needs: Not on file  Physical Activity: Not on file  Stress: Not on file  Social Connections: Not on file  Intimate Partner Violence: Not on file    Past Medical History, Surgical history, Social history, and Family history were reviewed and updated as appropriate.   Please see review of systems for further details on the patient's review from today.   Objective:   Physical Exam:  There were no vitals taken for this visit.  Physical Exam Constitutional:      General: He is not in acute distress. Musculoskeletal:        General: No deformity.  Neurological:     Mental Status: He is alert and oriented to person, place, and time.     Coordination: Coordination normal.  Psychiatric:        Attention and Perception: Attention and perception normal. He does not perceive auditory or visual hallucinations.        Mood and Affect: Mood normal. Mood is not anxious or depressed. Affect is not labile, blunt, angry or inappropriate.        Speech: Speech normal.        Behavior: Behavior normal.        Thought Content: Thought content normal. Thought content is not paranoid or delusional. Thought content does not include homicidal or suicidal ideation. Thought content does not include homicidal or suicidal plan.        Cognition and Memory: Cognition and memory normal.        Judgment: Judgment normal.     Comments: Insight intact      Lab Review:     Component Value Date/Time   NA 136 04/25/2020 0851   K 4.2 04/25/2020 0851   CL 103 04/25/2020 0851   CO2 25 04/25/2020 0851   GLUCOSE 157 (H) 04/25/2020 0851   BUN 16 04/25/2020 0851   CREATININE 0.94 04/25/2020 0851   CALCIUM 9.3 04/25/2020 0851   PROT 6.8 04/25/2020 0851   ALBUMIN 4.0 03/31/2017 1204   AST 21 04/25/2020 0851   ALT 21 04/25/2020 0851   ALKPHOS 81 03/31/2017 1204   BILITOT 0.4 04/25/2020 0851   GFRNONAA 93 04/25/2020 0851   GFRAA 108 04/25/2020 0851       Component Value Date/Time   WBC 8.4 03/31/2017 1204   RBC 5.38 03/31/2017 1204   HGB 15.9 03/31/2017 1204   HCT 48.0 03/31/2017 1204   PLT 306.0 03/31/2017 1204   MCV 89.3 03/31/2017 1204   MCH 30.7 05/14/2014 2327   MCHC 33.1 03/31/2017 1204   RDW 15.5 03/31/2017 1204   LYMPHSABS 2.0 03/31/2017 1204   MONOABS 0.8 03/31/2017 1204   EOSABS 0.2 03/31/2017 1204   BASOSABS 0.1 03/31/2017 1204    No results found for: POCLITH, LITHIUM   No results found for: PHENYTOIN, PHENOBARB, VALPROATE, CBMZ   .res Assessment: Plan:    Discussed potential benefits, risks, and side effects of Gabapentin for RLS and off-label use for anxiety. Discussed treating RLS and anxiety may improve insomnia. Pt agrees to trial of Gabapentin. Will start Gabapentin 300 mg 1-3 capsules 2-3 hours before bedtime. Continue Seroquel 200 mg po BID for mood, anxiety, and psychosis. Continue Xanax 1 mg po TID prn anxiety.  Continue Ambien 10 mg po QHS for insomnia.  Continue Lamictal 300 mg po QHS for mood stabilization.  Pt to follow-up in 3 months or sooner if clinically indicated.  Patient advised to contact office with any questions, adverse effects, or acute worsening in signs and symptoms.  Creedence was seen today for follow-up.  Diagnoses and all orders for this visit:  RLS (restless legs syndrome) -     gabapentin (NEURONTIN) 300 MG capsule; Take 1-3 capsules about 2-3 hours before  bedtime  Bipolar I disorder, mild, current or most recent episode depressed, with mixed features (HCC) -     ALPRAZolam (XANAX) 1 MG tablet; Take 1 tablet (1 mg total) by mouth 3 (three) times daily as needed for anxiety (Panic attack;  agitation). -     lamoTRIgine (LAMICTAL) 150 MG tablet; Take 2 tablets (300 mg total) by mouth at bedtime. -     QUEtiapine (SEROQUEL) 200 MG tablet; Take 1 tablet (200 mg total) by mouth 2 (two) times daily. -     zolpidem (AMBIEN) 10 MG tablet; Take 1 tablet (10 mg total) by mouth at bedtime.  Social anxiety disorder -     gabapentin (NEURONTIN) 300 MG capsule; Take 1-3 capsules about 2-3 hours before bedtime -     ALPRAZolam (XANAX) 1 MG tablet; Take 1 tablet (1 mg total) by mouth 3 (three) times daily as needed for anxiety (Panic attack;  agitation). -     QUEtiapine (SEROQUEL) 200 MG tablet; Take 1 tablet (200 mg total) by mouth 2 (two) times daily. -     zolpidem (AMBIEN) 10 MG tablet; Take 1 tablet (10 mg total) by mouth at bedtime.  Panic disorder -     ALPRAZolam (XANAX) 1 MG tablet; Take 1 tablet (1 mg total) by mouth 3 (three) times daily as needed for anxiety (Panic attack;  agitation). -     QUEtiapine (SEROQUEL) 200 MG tablet; Take 1 tablet (200 mg total) by mouth 2 (two) times daily.  Somatic symptom disorder -     lamoTRIgine (LAMICTAL) 150 MG tablet; Take 2 tablets (300 mg total) by mouth at bedtime. -     zolpidem (AMBIEN) 10 MG tablet; Take 1 tablet (10 mg total) by mouth at bedtime.  OSA on CPAP -     zolpidem (AMBIEN) 10 MG tablet; Take 1 tablet (10 mg total) by mouth at bedtime.     Please see After Visit Summary for patient specific instructions.  Future Appointments  Date Time Provider [place]  02/22/2021  9:30 AM Thayer Headings, PMHNP CP-CP None    No orders of the defined types were placed in this encounter.   -------------------------------

## 2020-12-17 ENCOUNTER — Other Ambulatory Visit: Payer: Self-pay | Admitting: Family Medicine

## 2020-12-17 DIAGNOSIS — K3184 Gastroparesis: Secondary | ICD-10-CM

## 2020-12-25 ENCOUNTER — Other Ambulatory Visit: Payer: Self-pay | Admitting: Psychiatry

## 2020-12-25 DIAGNOSIS — F451 Undifferentiated somatoform disorder: Secondary | ICD-10-CM

## 2021-02-22 ENCOUNTER — Ambulatory Visit: Payer: BC Managed Care – PPO | Admitting: Psychiatry

## 2021-02-22 ENCOUNTER — Ambulatory Visit: Payer: BC Managed Care – PPO | Admitting: Adult Health

## 2021-03-27 ENCOUNTER — Other Ambulatory Visit: Payer: Self-pay | Admitting: Psychiatry

## 2021-03-27 DIAGNOSIS — G4733 Obstructive sleep apnea (adult) (pediatric): Secondary | ICD-10-CM

## 2021-03-27 DIAGNOSIS — F451 Undifferentiated somatoform disorder: Secondary | ICD-10-CM

## 2021-03-27 DIAGNOSIS — F3131 Bipolar disorder, current episode depressed, mild: Secondary | ICD-10-CM

## 2021-03-27 DIAGNOSIS — F4010 Social phobia, unspecified: Secondary | ICD-10-CM

## 2021-03-27 DIAGNOSIS — F410 Panic disorder [episodic paroxysmal anxiety] without agoraphobia: Secondary | ICD-10-CM

## 2021-03-27 NOTE — Telephone Encounter (Signed)
Have pt set up f/u with Janett Billow

## 2021-03-28 ENCOUNTER — Telehealth: Payer: Self-pay | Admitting: Psychiatry

## 2021-03-28 NOTE — Telephone Encounter (Signed)
Last filled Ambien 4/29 apt 8/30

## 2021-03-28 NOTE — Telephone Encounter (Signed)
Pended.

## 2021-03-28 NOTE — Telephone Encounter (Signed)
Next visit is 04/30/21. Requesting refill for Ambien 10 mg called to:  [id number] [place] [place] - [address] Phone:  [phone] Fax:  [phone]

## 2021-04-21 ENCOUNTER — Other Ambulatory Visit: Payer: Self-pay | Admitting: Psychiatry

## 2021-04-21 ENCOUNTER — Other Ambulatory Visit: Payer: Self-pay | Admitting: Family Medicine

## 2021-04-21 DIAGNOSIS — R110 Nausea: Secondary | ICD-10-CM

## 2021-04-21 DIAGNOSIS — F4010 Social phobia, unspecified: Secondary | ICD-10-CM

## 2021-04-21 DIAGNOSIS — G2581 Restless legs syndrome: Secondary | ICD-10-CM

## 2021-04-22 ENCOUNTER — Other Ambulatory Visit: Payer: Self-pay | Admitting: Family Medicine

## 2021-04-22 ENCOUNTER — Other Ambulatory Visit: Payer: Self-pay | Admitting: Psychiatry

## 2021-04-22 DIAGNOSIS — F4010 Social phobia, unspecified: Secondary | ICD-10-CM

## 2021-04-22 DIAGNOSIS — F410 Panic disorder [episodic paroxysmal anxiety] without agoraphobia: Secondary | ICD-10-CM

## 2021-04-22 DIAGNOSIS — F451 Undifferentiated somatoform disorder: Secondary | ICD-10-CM

## 2021-04-22 DIAGNOSIS — F3131 Bipolar disorder, current episode depressed, mild: Secondary | ICD-10-CM

## 2021-04-22 DIAGNOSIS — K3184 Gastroparesis: Secondary | ICD-10-CM

## 2021-04-30 ENCOUNTER — Ambulatory Visit: Payer: BC Managed Care – PPO | Admitting: Psychiatry

## 2021-06-24 ENCOUNTER — Other Ambulatory Visit: Payer: Self-pay | Admitting: Nurse Practitioner

## 2021-06-24 ENCOUNTER — Other Ambulatory Visit: Payer: Self-pay | Admitting: Psychiatry

## 2021-06-24 DIAGNOSIS — F3131 Bipolar disorder, current episode depressed, mild: Secondary | ICD-10-CM

## 2021-06-24 DIAGNOSIS — G2581 Restless legs syndrome: Secondary | ICD-10-CM

## 2021-06-24 DIAGNOSIS — G4733 Obstructive sleep apnea (adult) (pediatric): Secondary | ICD-10-CM

## 2021-06-24 DIAGNOSIS — F451 Undifferentiated somatoform disorder: Secondary | ICD-10-CM

## 2021-06-24 DIAGNOSIS — F4010 Social phobia, unspecified: Secondary | ICD-10-CM

## 2021-06-25 NOTE — Telephone Encounter (Signed)
Can you please review Dr. Clovis Pu. Pt last seen 10/2020 [facility] pt  NS 6/24 NS 8/30  Rescheduled 11/30 with Janett Billow

## 2021-06-27 ENCOUNTER — Other Ambulatory Visit: Payer: Self-pay

## 2021-06-27 ENCOUNTER — Other Ambulatory Visit: Payer: Self-pay | Admitting: Psychiatry

## 2021-06-27 DIAGNOSIS — F451 Undifferentiated somatoform disorder: Secondary | ICD-10-CM

## 2021-06-27 DIAGNOSIS — F3131 Bipolar disorder, current episode depressed, mild: Secondary | ICD-10-CM

## 2021-06-27 MED ORDER — LAMOTRIGINE 150 MG PO TABS: 300.0000 mg | ORAL_TABLET | Freq: Every day | ORAL | 0 refills | Status: DC

## 2021-07-12 ENCOUNTER — Other Ambulatory Visit: Payer: Self-pay | Admitting: Psychiatry

## 2021-07-12 DIAGNOSIS — F4010 Social phobia, unspecified: Secondary | ICD-10-CM

## 2021-07-12 DIAGNOSIS — G2581 Restless legs syndrome: Secondary | ICD-10-CM

## 2021-07-20 ENCOUNTER — Other Ambulatory Visit: Payer: Self-pay | Admitting: Psychiatry

## 2021-07-20 DIAGNOSIS — F410 Panic disorder [episodic paroxysmal anxiety] without agoraphobia: Secondary | ICD-10-CM

## 2021-07-20 DIAGNOSIS — F4010 Social phobia, unspecified: Secondary | ICD-10-CM

## 2021-07-20 DIAGNOSIS — F451 Undifferentiated somatoform disorder: Secondary | ICD-10-CM

## 2021-07-20 DIAGNOSIS — F3131 Bipolar disorder, current episode depressed, mild: Secondary | ICD-10-CM

## 2021-07-24 ENCOUNTER — Other Ambulatory Visit: Payer: Self-pay | Admitting: Family Medicine

## 2021-07-24 DIAGNOSIS — R110 Nausea: Secondary | ICD-10-CM

## 2021-07-26 ENCOUNTER — Other Ambulatory Visit: Payer: Self-pay | Admitting: Psychiatry

## 2021-07-26 DIAGNOSIS — G4733 Obstructive sleep apnea (adult) (pediatric): Secondary | ICD-10-CM

## 2021-07-26 DIAGNOSIS — G2581 Restless legs syndrome: Secondary | ICD-10-CM

## 2021-07-26 DIAGNOSIS — F451 Undifferentiated somatoform disorder: Secondary | ICD-10-CM

## 2021-07-26 DIAGNOSIS — F4010 Social phobia, unspecified: Secondary | ICD-10-CM

## 2021-07-26 DIAGNOSIS — F3131 Bipolar disorder, current episode depressed, mild: Secondary | ICD-10-CM

## 2021-07-31 ENCOUNTER — Ambulatory Visit: Payer: BC Managed Care – PPO | Admitting: Psychiatry

## 2021-08-13 ENCOUNTER — Other Ambulatory Visit: Payer: Self-pay | Admitting: Psychiatry

## 2021-08-13 DIAGNOSIS — F4010 Social phobia, unspecified: Secondary | ICD-10-CM

## 2021-08-13 DIAGNOSIS — G2581 Restless legs syndrome: Secondary | ICD-10-CM

## 2021-08-16 NOTE — Progress Notes (Deleted)
ACUTE VISIT No chief complaint on file.  HPI: [name] is a 50 y.o. male, who is here today complaining of *** HPI  Review of Systems Rest see pertinent positives and negatives per HPI.  Current Outpatient Medications on File Prior to Visit  Medication Sig Dispense Refill   ALPRAZolam (XANAX) 1 MG tablet TAKE 1 TABLET BY MOUTH THREE TIMES A DAY AS NEEDED FOR ANXIETY, PANIC ATTACKS, OR AGITATION 90 tablet 0   lamoTRIgine (LAMICTAL) 150 MG tablet TAKE 2 TABLETS BY MOUTH AT BEDTIME. 30 tablet 0   QUEtiapine (SEROQUEL) 200 MG tablet TAKE 1 TABLET BY MOUTH TWICE A DAY 60 tablet 0   atorvastatin (LIPITOR) 40 MG tablet TAKE 1 TABLET BY MOUTH EVERY DAY 90 tablet 3   gabapentin (NEURONTIN) 300 MG capsule TAKE 1 TO 3 CAPSULES BY MOUTH 2-3 HOURS BEFORE BEDTIME *KEEP APPT* 60 capsule 0   lubiprostone (AMITIZA) 24 MCG capsule TAKE 1 CAPSULE BY MOUTH TWICE A DAY WITH A MEAL 60 capsule 3   metoCLOPramide (REGLAN) 5 MG tablet TAKE 1 TABLET BY MOUTH 3 TIMES DAILY BEFORE MEALS. 270 tablet 1   ondansetron (ZOFRAN) 4 MG tablet TAKE 1 TABLET BY MOUTH TWICE A DAY AS NEEDED FOR NAUSEA/VOMITING 15 tablet 0   prochlorperazine (COMPAZINE) 10 MG tablet TAKE 1 TABLET BY MOUTH EVERY DAY AS NEEDED 30 tablet 0   zolpidem (AMBIEN) 10 MG tablet TAKE 1 TABLET BY MOUTH EVERYDAY AT BEDTIME 30 tablet 0   [DISCONTINUED] omeprazole (PRILOSEC OTC) 20 MG tablet 1 tablet twice daily. Do not eat for one hour after ingestion of the medication 60 tablet 11   No current facility-administered medications on file prior to visit.     Past Medical History:  Diagnosis Date   ALLERGIC RHINITIS 05/10/2009   Allergy    anemia   Anxiety    Bipolar disorder ([place])    see medications patient to bring   Chronic headaches    DDD (degenerative disc disease)    DEPRESSION 05/10/2009   Gastroparesis    GERD 05/10/2009   LIBIDO, DECREASED 05/10/2009   Neuromuscular disorder ([place])    pt. denies 08/01/16   Osgood-Schlatter's  disease    SLEEP APNEA, OBSTRUCTIVE 06/12/2009   CPAP not utilized after 30 lb. weight loss   SMOKER 06/12/2009   TESTICULAR HYPOFUNCTION 06/12/2009   WEIGHT GAIN 05/10/2009   Allergies  Allergen Reactions   Topamax [Topiramate]     Sleep walking, hallucinations, "I get mean, can't help it"    Social History   Socioeconomic History   Marital status: Married    Spouse name: [name]   Number of children: 0   Years of education: 14   Highest education level: Not on file  Occupational History   Occupation: Unemployeed  Tobacco Use   Smoking status: Former    Packs/day: 0.50    Years: 15.00    Pack years: 7.50    Types: Cigarettes   Smokeless tobacco: Former    Types: Chew    Quit date: 09/01/1998   Tobacco comments:    tobacco info given 08/01/13  Vaping Use   Vaping Use: Never used  Substance and Sexual Activity   Alcohol use: Not Currently    Alcohol/week: 1.0 standard drink    Types: 1 Cans of beer per week    Comment: social, rare   Drug use: No   Sexual activity: Yes  Other Topics Concern   Not on file  Social History Narrative  Lives with wife at home   Caffeine use- tea, 3 glasses daily   Social Determinants of Health   Financial Resource Strain: Not on file  Food Insecurity: Not on file  Transportation Needs: Not on file  Physical Activity: Not on file  Stress: Not on file  Social Connections: Not on file    There were no vitals filed for this visit. There is no height or weight on file to calculate BMI.  Physical Exam  ASSESSMENT AND PLAN:  There are no diagnoses linked to this encounter.   No follow-ups on file.   Betty G. Martinique, MD  [facility]. [place] office.  Discharge Instructions   None

## 2021-08-19 ENCOUNTER — Encounter: Payer: Self-pay | Admitting: Family Medicine

## 2021-08-19 ENCOUNTER — Telehealth (INDEPENDENT_AMBULATORY_CARE_PROVIDER_SITE_OTHER): Payer: BC Managed Care – PPO | Admitting: Family Medicine

## 2021-08-19 VITALS — Ht 67.75 in

## 2021-08-19 DIAGNOSIS — J989 Respiratory disorder, unspecified: Secondary | ICD-10-CM

## 2021-08-19 DIAGNOSIS — J029 Acute pharyngitis, unspecified: Secondary | ICD-10-CM | POA: Diagnosis not present

## 2021-08-19 DIAGNOSIS — J040 Acute laryngitis: Secondary | ICD-10-CM | POA: Diagnosis not present

## 2021-08-19 MED ORDER — DEXAMETHASONE 4 MG PO TABS: 4.0000 mg | ORAL_TABLET | Freq: Every day | ORAL | 0 refills | Status: AC

## 2021-08-19 MED ORDER — ALBUTEROL SULFATE HFA 108 (90 BASE) MCG/ACT IN AERS: 2.0000 | INHALATION_SPRAY | Freq: Four times a day (QID) | RESPIRATORY_TRACT | 0 refills | Status: DC | PRN

## 2021-08-19 NOTE — Progress Notes (Signed)
Virtual Visit via Video Note I connected with Sean Wu on 08/19/21 by a video enabled telemedicine application and verified that I am speaking with the correct person using two identifiers.  Location patient: home Location provider:work office Persons participating in the virtual visit: patient, provider After several links sent and 10-12 min he could not connect , so visit was changed to tel visit.  I discussed the limitations of evaluation and management by telemedicine and the availability of in person appointments. The patient expressed understanding and agreed to proceed.  Chief Complaint  Patient presents with   Sore Throat    Hoarseness, tested positive for covid about a month ago.   HPI: Sean Wu is a 50 yo male with hx of bipolar disorder,allergies,HLD,gastroparesis, somatic symptom disorder,and GERD c/o sore throat that has been going on intermittently for a couple weeks. It is worse at the end of the day. States that he does "not feel sick." Productive cough with blood mixed with sputum "a couple times." "Little" wheezing and dysphonia. Nasal congestion, rhinorrhea,post nasal drainage. + Tender glands.  Intermittent episodes of stridor and feels like his throat is swollen. Negative for facial/lip edema or oral lesions. Negative for fever,chills,body aches,headache,anosmia,ageusia,dysphagia,CP,palpitations,abdominal pain,worsening nausea,vomiting,changes in bowel habits,urinary symptoms,or skin rash. Taking OTC sudafed.  No sick contact. Dx'ed with COVID 19 infection a month ago. + Former smoker.  ROS: See pertinent positives and negatives per HPI.  Past Medical History:  Diagnosis Date   ALLERGIC RHINITIS 05/10/2009   Allergy    anemia   Anxiety    Bipolar disorder ([place])    see medications patient to bring   Chronic headaches    DDD (degenerative disc disease)    DEPRESSION 05/10/2009   Gastroparesis    GERD 05/10/2009   LIBIDO, DECREASED 05/10/2009    Neuromuscular disorder ([place])    pt. denies 08/01/16   Osgood-Schlatter's disease    SLEEP APNEA, OBSTRUCTIVE 06/12/2009   CPAP not utilized after 30 lb. weight loss   SMOKER 06/12/2009   TESTICULAR HYPOFUNCTION 06/12/2009   WEIGHT GAIN 05/10/2009    Past Surgical History:  Procedure Laterality Date   ANKLE SURGERY Left    COLONOSCOPY  2014   normal   EYE SURGERY     eye implant   FRACTURE SURGERY     surgery on right ankle for ligament torn 2013   LASIK Bilateral    SHOULDER ARTHROSCOPY WITH DISTAL CLAVICLE RESECTION Right 02/26/2015   Procedure: SHOULDER DIAGNOSTIC ARTHROSCOPY WITH OPEN DISTAL CLAVICLE EXCISION;  Surgeon: Tania Ade, MD;  Location: [facility];  Service: Orthopedics;  Laterality: Right;  Right diagnostic arthroscopy, open distal clavical excision    Family History  Problem Relation Age of Onset   Heart disease Maternal Grandfather    Irritable bowel syndrome Father    Alcoholism Father    Heart attack Father    Bipolar disorder Father    Colon cancer Neg Hx    Rectal cancer Neg Hx    Stomach cancer Neg Hx     Social History   Socioeconomic History   Marital status: Married    Spouse name: [name]   Number of children: 0   Years of education: 14   Highest education level: Not on file  Occupational History   Occupation: Unemployeed  Tobacco Use   Smoking status: Former    Packs/day: 0.50    Years: 15.00    Pack years: 7.50    Types: Cigarettes   Smokeless tobacco: Former  Types: Sarina Ser    Quit date: 09/01/1998   Tobacco comments:    tobacco info given 08/01/13  Vaping Use   Vaping Use: Never used  Substance and Sexual Activity   Alcohol use: Not Currently    Alcohol/week: 1.0 standard drink    Types: 1 Cans of beer per week    Comment: social, rare   Drug use: No   Sexual activity: Yes  Other Topics Concern   Not on file  Social History Narrative   Lives with wife at home   Caffeine use- tea, 3 glasses daily   Social  Determinants of Health   Financial Resource Strain: Not on file  Food Insecurity: Not on file  Transportation Needs: Not on file  Physical Activity: Not on file  Stress: Not on file  Social Connections: Not on file  Intimate Partner Violence: Not on file   Current Outpatient Medications:    ALPRAZolam (XANAX) 1 MG tablet, TAKE 1 TABLET BY MOUTH THREE TIMES A DAY AS NEEDED FOR ANXIETY, PANIC ATTACKS, OR AGITATION, Disp: 90 tablet, Rfl: 0   atorvastatin (LIPITOR) 40 MG tablet, TAKE 1 TABLET BY MOUTH EVERY DAY, Disp: 90 tablet, Rfl: 3   gabapentin (NEURONTIN) 300 MG capsule, TAKE 1 TO 3 CAPSULES BY MOUTH 2-3 HOURS BEFORE BEDTIME *KEEP APPT*, Disp: 60 capsule, Rfl: 0   lamoTRIgine (LAMICTAL) 150 MG tablet, TAKE 2 TABLETS BY MOUTH AT BEDTIME., Disp: 30 tablet, Rfl: 0   lubiprostone (AMITIZA) 24 MCG capsule, TAKE 1 CAPSULE BY MOUTH TWICE A DAY WITH A MEAL, Disp: 60 capsule, Rfl: 3   metoCLOPramide (REGLAN) 5 MG tablet, TAKE 1 TABLET BY MOUTH 3 TIMES DAILY BEFORE MEALS., Disp: 270 tablet, Rfl: 1   ondansetron (ZOFRAN) 4 MG tablet, TAKE 1 TABLET BY MOUTH TWICE A DAY AS NEEDED FOR NAUSEA/VOMITING, Disp: 15 tablet, Rfl: 0   prochlorperazine (COMPAZINE) 10 MG tablet, TAKE 1 TABLET BY MOUTH EVERY DAY AS NEEDED, Disp: 30 tablet, Rfl: 0   QUEtiapine (SEROQUEL) 200 MG tablet, TAKE 1 TABLET BY MOUTH TWICE A DAY, Disp: 60 tablet, Rfl: 0   zolpidem (AMBIEN) 10 MG tablet, TAKE 1 TABLET BY MOUTH EVERYDAY AT BEDTIME, Disp: 30 tablet, Rfl: 0  EXAM:  VITALS per patient if applicable:Ht 5' 2.95" (1.721 m)    BMI 31.86 kg/m   GENERAL: alert, oriented, appears well and in no acute distress  HEENT: On telephone he does not sound congested, no stridor appreciated. Mild dysphonia.  LUNGS:No signs of respiratory distress on the phone, breathing appears normal, no obvious gross SOB, gasping or wheezing No cough.  PSYCH/NEURO: pleasant and cooperative, no obvious depression, + anxious.   ASSESSMENT AND  PLAN:  Discussed the following assessment and plan:  Sore throat We discussed possible etiologies. ? Allergies. Residual s/p COVID 19 infection. Hx does not suggest an infectious process.  Reactive airway disease without asthma - Plan: dexamethasone (DECADRON) 4 MG tablet, albuterol (VENTOLIN HFA) 108 (90 Base) MCG/ACT inhaler ? COPD. Albuterol inh 2 puff every 6 hours for a week then as needed for wheezing or shortness of breath.  After discussion of some side effects, he agrees with trying trial of Dexamethasone. CXR next visit.  Laryngitis, acute He is reporting intermittent stridor and swollen throat for a couple weeks.Explained that phone it is difficult to perform evaluation on a tel visit, I do not appreciate stridor or muffle voice on the phone. Mild dysphonia. Zyrtec 10 mg twice daily for 3 days then once daily. He has  not tolerated prednisone in the past, it caused irritability.  He agrees with trying dexamethasone 4 mg daily in the morning x7 days. He was clearly instructed about warning signs. Voice rest recommended. F/U in a week.  We discussed possible serious and likely etiologies, options for evaluation and workup, limitations of telemedicine visit vs in person visit, treatment, treatment risks and precautions. The patient was advised to call back or seek an in-person evaluation if the symptoms worsen or if the condition fails to improve as anticipated. I discussed the assessment and treatment plan with the patient. The patient was provided an opportunity to ask questions and all were answered. The patient agreed with the plan and demonstrated an understanding of the instructions.  I provided 15 minutes of non-face-to-face time during this encounter.  Return in about 1 week (around 08/26/2021).  Enaya Howze G. Martinique, MD  [facility]. [place] office.

## 2021-08-21 ENCOUNTER — Other Ambulatory Visit: Payer: Self-pay | Admitting: Psychiatry

## 2021-08-21 ENCOUNTER — Other Ambulatory Visit: Payer: Self-pay | Admitting: Family Medicine

## 2021-08-21 DIAGNOSIS — F3131 Bipolar disorder, current episode depressed, mild: Secondary | ICD-10-CM

## 2021-08-21 DIAGNOSIS — R110 Nausea: Secondary | ICD-10-CM

## 2021-08-21 DIAGNOSIS — G2581 Restless legs syndrome: Secondary | ICD-10-CM

## 2021-08-21 DIAGNOSIS — J989 Respiratory disorder, unspecified: Secondary | ICD-10-CM

## 2021-08-21 DIAGNOSIS — F4010 Social phobia, unspecified: Secondary | ICD-10-CM

## 2021-08-21 DIAGNOSIS — F410 Panic disorder [episodic paroxysmal anxiety] without agoraphobia: Secondary | ICD-10-CM

## 2021-08-28 ENCOUNTER — Emergency Department (HOSPITAL_BASED_OUTPATIENT_CLINIC_OR_DEPARTMENT_OTHER): Payer: BC Managed Care – PPO

## 2021-08-28 ENCOUNTER — Telehealth: Payer: Self-pay | Admitting: Psychiatry

## 2021-08-28 ENCOUNTER — Emergency Department (HOSPITAL_BASED_OUTPATIENT_CLINIC_OR_DEPARTMENT_OTHER)
Admission: EM | Admit: 2021-08-28 | Discharge: 2021-08-28 | Disposition: A | Discharge status: Home / Self Care | Payer: BC Managed Care – PPO | Attending: Emergency Medicine | Admitting: Emergency Medicine

## 2021-08-28 ENCOUNTER — Other Ambulatory Visit: Payer: Self-pay

## 2021-08-28 ENCOUNTER — Encounter (HOSPITAL_BASED_OUTPATIENT_CLINIC_OR_DEPARTMENT_OTHER): Payer: Self-pay | Admitting: Emergency Medicine

## 2021-08-28 DIAGNOSIS — R059 Cough, unspecified: Secondary | ICD-10-CM | POA: Diagnosis not present

## 2021-08-28 DIAGNOSIS — R066 Hiccough: Secondary | ICD-10-CM | POA: Insufficient documentation

## 2021-08-28 DIAGNOSIS — Z87891 Personal history of nicotine dependence: Secondary | ICD-10-CM | POA: Insufficient documentation

## 2021-08-28 DIAGNOSIS — R531 Weakness: Secondary | ICD-10-CM | POA: Diagnosis not present

## 2021-08-28 DIAGNOSIS — R070 Pain in throat: Secondary | ICD-10-CM | POA: Insufficient documentation

## 2021-08-28 DIAGNOSIS — K219 Gastro-esophageal reflux disease without esophagitis: Secondary | ICD-10-CM | POA: Insufficient documentation

## 2021-08-28 DIAGNOSIS — Z20822 Contact with and (suspected) exposure to covid-19: Secondary | ICD-10-CM | POA: Diagnosis not present

## 2021-08-28 DIAGNOSIS — F419 Anxiety disorder, unspecified: Secondary | ICD-10-CM | POA: Diagnosis not present

## 2021-08-28 DIAGNOSIS — R1310 Dysphagia, unspecified: Secondary | ICD-10-CM | POA: Diagnosis not present

## 2021-08-28 DIAGNOSIS — R1013 Epigastric pain: Secondary | ICD-10-CM | POA: Diagnosis not present

## 2021-08-28 DIAGNOSIS — R079 Chest pain, unspecified: Secondary | ICD-10-CM | POA: Insufficient documentation

## 2021-08-28 DIAGNOSIS — R0602 Shortness of breath: Secondary | ICD-10-CM

## 2021-08-28 LAB — CBC WITH DIFFERENTIAL/PLATELET
Abs Immature Granulocytes: 0.03 10*3/uL (ref 0.00–0.07)
Basophils Absolute: 0.1 10*3/uL (ref 0.0–0.1)
Basophils Relative: 1 %
Eosinophils Absolute: 0.3 10*3/uL (ref 0.0–0.5)
Eosinophils Relative: 4 %
HCT: 45.9 % (ref 39.0–52.0)
Hemoglobin: 15.5 g/dL (ref 13.0–17.0)
Immature Granulocytes: 0 %
Lymphocytes Relative: 24 %
Lymphs Abs: 1.9 10*3/uL (ref 0.7–4.0)
MCH: 29 pg (ref 26.0–34.0)
MCHC: 33.8 g/dL (ref 30.0–36.0)
MCV: 85.8 fL (ref 80.0–100.0)
Monocytes Absolute: 0.6 10*3/uL (ref 0.1–1.0)
Monocytes Relative: 8 %
Neutro Abs: 5 10*3/uL (ref 1.7–7.7)
Neutrophils Relative %: 63 %
Platelets: 340 10*3/uL (ref 150–400)
RBC: 5.35 MIL/uL (ref 4.22–5.81)
RDW: 14 % (ref 11.5–15.5)
WBC: 8 10*3/uL (ref 4.0–10.5)
nRBC: 0 % (ref 0.0–0.2)

## 2021-08-28 LAB — COMPREHENSIVE METABOLIC PANEL
ALT: 8 U/L (ref 0–44)
AST: 9 U/L — ABNORMAL LOW (ref 15–41)
Albumin: 3.7 g/dL (ref 3.5–5.0)
Alkaline Phosphatase: 79 U/L (ref 38–126)
Anion gap: 7 (ref 5–15)
BUN: 15 mg/dL (ref 6–20)
CO2: 26 mmol/L (ref 22–32)
Calcium: 9 mg/dL (ref 8.9–10.3)
Chloride: 102 mmol/L (ref 98–111)
Creatinine, Ser: 0.73 mg/dL (ref 0.61–1.24)
GFR, Estimated: >60 mL/min (ref >60)
Glucose, Bld: 129 mg/dL — ABNORMAL HIGH (ref 70–99)
Potassium: 3.9 mmol/L (ref 3.5–5.1)
Sodium: 135 mmol/L (ref 135–145)
Total Bilirubin: 0.4 mg/dL (ref 0.3–1.2)
Total Protein: 7 g/dL (ref 6.5–8.1)

## 2021-08-28 LAB — GROUP A STREP BY PCR: Group A Strep by PCR: NOT DETECTED

## 2021-08-28 LAB — D-DIMER, QUANTITATIVE: D-Dimer, Quant: <0.27 ug/mL-FEU (ref 0.00–0.50)

## 2021-08-28 LAB — TROPONIN I (HIGH SENSITIVITY)
Troponin I (High Sensitivity): <2 ng/L (ref <18)
Troponin I (High Sensitivity): <2 ng/L (ref <18)

## 2021-08-28 LAB — RESP PANEL BY RT-PCR (FLU A&B, COVID) ARPGX2
Influenza A by PCR: NEGATIVE
Influenza B by PCR: NEGATIVE
SARS Coronavirus 2 by RT PCR: NEGATIVE

## 2021-08-28 LAB — LIPASE, BLOOD: Lipase: 56 U/L — ABNORMAL HIGH (ref 11–51)

## 2021-08-28 MED ORDER — LIDOCAINE VISCOUS HCL 2 % MT SOLN: 15.0000 mL | OROMUCOSAL | 0 refills | Status: DC | PRN

## 2021-08-28 MED ORDER — LIDOCAINE VISCOUS HCL 2 % MT SOLN
15.0000 mL | Freq: Once | OROMUCOSAL | Status: AC
  Administered 2021-08-28: 14:00:00 15 mL via ORAL
  Filled 2021-08-28: qty 15

## 2021-08-28 MED ORDER — IOHEXOL 350 MG/ML SOLN
80.0000 mL | Freq: Once | INTRAVENOUS | Status: AC | PRN
  Administered 2021-08-28: 16:00:00 80 mL via INTRAVENOUS

## 2021-08-28 MED ORDER — SODIUM CHLORIDE 0.9 % IV BOLUS
1000.0000 mL | Freq: Once | INTRAVENOUS | Status: AC
  Administered 2021-08-28: 15:00:00 1000 mL via INTRAVENOUS

## 2021-08-28 MED ORDER — LORAZEPAM 2 MG/ML IJ SOLN
1.0000 mg | Freq: Once | INTRAMUSCULAR | Status: AC
  Administered 2021-08-28: 15:00:00 1 mg via INTRAVENOUS
  Filled 2021-08-28: qty 1

## 2021-08-28 MED ORDER — ALUM & MAG HYDROXIDE-SIMETH 200-200-20 MG/5ML PO SUSP
30.0000 mL | Freq: Once | ORAL | Status: AC
  Administered 2021-08-28: 14:00:00 30 mL via ORAL
  Filled 2021-08-28: qty 30

## 2021-08-28 NOTE — Discharge Instructions (Signed)
Follow up with primary care provider and GI

## 2021-08-28 NOTE — Telephone Encounter (Signed)
Pt called requesting refills for Xanax & Ambien [facility]. Sent message to [facility] if ok to schedule. Has 4 no shows this year. Last apt 11/23/20. Pt # [phone]. Pt is now @ ER. Wife [name] LM on VM.

## 2021-08-28 NOTE — ED Provider Notes (Addendum)
[facility] EMERGENCY DEPT Provider Note   CSN: [id number] Arrival date & time: 08/28/21  1304    History Chief Complaint  Patient presents with   Shortness of Breath    TITO AUSMUS is a 50 y.o. male with past medical history significant for GERD, gastroparesis, bipolar, anxiety, chronic headache, tobacco use, somatic disorder who presents for evaluation multiple complaints.  Patient with shortness of breath, chest pain, abdominal pain, hiccups, burping, generalized weakness, cough, difficulty swallowing, nausea, scratchy throat.  Initially had telehealth visit PCP 3 weeks ago at start of symptoms.  Symptoms progressing.  Has occasionally felt over the last year whenever he eats large bites of food they have "difficulty going down."  He is able to tolerate liquids at home without difficulty.  No prior history of esophageal stricture.  No fever, back pain, facial droop, paresthesias, lateral weakness, urinary complaints, melena, blood per rectum.  Denies additional aggravating or alleviating factors.  History obtained from patient and past medical records.  No interpreter is used.  HPI     Past Medical History:  Diagnosis Date   ALLERGIC RHINITIS 05/10/2009   Allergy    anemia   Anxiety    Bipolar disorder ([place])    see medications patient to bring   Chronic headaches    DDD (degenerative disc disease)    DEPRESSION 05/10/2009   Gastroparesis    GERD 05/10/2009   LIBIDO, DECREASED 05/10/2009   Neuromuscular disorder ([place])    pt. denies 08/01/16   Osgood-Schlatter's disease    SLEEP APNEA, OBSTRUCTIVE 06/12/2009   CPAP not utilized after 30 lb. weight loss   SMOKER 06/12/2009   TESTICULAR HYPOFUNCTION 06/12/2009   WEIGHT GAIN 05/10/2009    Patient Active Problem List   Diagnosis Date Noted   Hyperlipidemia 02/24/2020   Panic disorder 02/14/2019   Alcohol use disorder, severe, in sustained remission ([place]) 11/02/2018   Somatic symptom disorder 06/17/2018   Social  anxiety disorder 04/10/2015   OSA on CPAP 03/30/2015   Bipolar I disorder, mild, current or most recent episode depressed, with mixed features ([place]) 01/16/2015   Constipation, chronic 01/05/2015   Greater trochanteric bursitis of left hip 02/14/2014   Lumbar radiculopathy, chronic 02/14/2014   Radiculopathy 11/24/2013   Hereditary and idiopathic peripheral neuropathy 11/24/2013   Back pain 07/12/2013   Gastroparesis 06/29/2013   Testosterone deficiency 06/12/2009   Allergic rhinitis 05/10/2009   GERD 05/10/2009   LIBIDO, DECREASED 05/10/2009    Past Surgical History:  Procedure Laterality Date   ANKLE SURGERY Left    COLONOSCOPY  2014   normal   EYE SURGERY     eye implant   FRACTURE SURGERY     surgery on right ankle for ligament torn 2013   LASIK Bilateral    SHOULDER ARTHROSCOPY WITH DISTAL CLAVICLE RESECTION Right 02/26/2015   Procedure: SHOULDER DIAGNOSTIC ARTHROSCOPY WITH OPEN DISTAL CLAVICLE EXCISION;  Surgeon: Tania Ade, MD;  Location: [facility];  Service: Orthopedics;  Laterality: Right;  Right diagnostic arthroscopy, open distal clavical excision       Family History  Problem Relation Age of Onset   Heart disease Maternal Grandfather    Irritable bowel syndrome Father    Alcoholism Father    Heart attack Father    Bipolar disorder Father    Colon cancer Neg Hx    Rectal cancer Neg Hx    Stomach cancer Neg Hx     Social History   Tobacco Use   Smoking status:  Former    Packs/day: 0.50    Years: 15.00    Pack years: 7.50    Types: Cigarettes   Smokeless tobacco: Former    Types: Chew    Quit date: 09/01/1998   Tobacco comments:    tobacco info given 08/01/13  Vaping Use   Vaping Use: Never used  Substance Use Topics   Alcohol use: Not Currently    Alcohol/week: 1.0 standard drink    Types: 1 Cans of beer per week    Comment: social, rare   Drug use: No    Home Medications Prior to Admission medications   Medication Sig  Start Date End Date Taking? Authorizing Provider  lidocaine (XYLOCAINE) 2 % solution Use as directed 15 mLs in the mouth or throat as needed for mouth pain. 08/28/21  Yes Lynzie Cliburn A, PA-C  albuterol (VENTOLIN HFA) 108 (90 Base) MCG/ACT inhaler TAKE 2 PUFFS BY MOUTH EVERY 6 HOURS AS NEEDED FOR WHEEZE OR SHORTNESS OF BREATH 08/21/21   Martin[place], Betty G, MD  ALPRAZolam Duanne Moron) 1 MG tablet TAKE 1 TABLET BY MOUTH THREE TIMES A DAY AS NEEDED FOR ANXIETY, PANIC ATTACKS, OR AGITATION 07/22/21   Thayer Headings, PMHNP  atorvastatin (LIPITOR) 40 MG tablet TAKE 1 TABLET BY MOUTH EVERY DAY 10/01/20   [place], Betty G, MD  gabapentin (NEURONTIN) 300 MG capsule TAKE 1 TO 3 CAPSULES BY MOUTH 2-3 HOURS BEFORE BEDTIME *KEEP APPT* 08/14/21   Thayer Headings, PMHNP  lamoTRIgine (LAMICTAL) 150 MG tablet TAKE 2 TABLETS BY MOUTH AT BEDTIME. 07/22/21   Thayer Headings, PMHNP  lubiprostone (AMITIZA) 24 MCG capsule TAKE 1 CAPSULE BY MOUTH TWICE A DAY WITH A MEAL 06/24/21   Willia Craze, NP  metoCLOPramide (REGLAN) 5 MG tablet TAKE 1 TABLET BY MOUTH 3 TIMES DAILY BEFORE MEALS. 04/22/21   [place], Betty G, MD  ondansetron (ZOFRAN) 4 MG tablet TAKE 1 TABLET BY MOUTH TWICE A DAY AS NEEDED FOR NAUSEA/VOMITING 06/25/21   Cottle, Billey Co., MD  prochlorperazine (COMPAZINE) 10 MG tablet TAKE 1 TABLET BY MOUTH EVERY DAY AS NEEDED 08/21/21   [place], Betty G, MD  QUEtiapine (SEROQUEL) 200 MG tablet TAKE 1 TABLET BY MOUTH TWICE A DAY 07/22/21   Thayer Headings, PMHNP  zolpidem (AMBIEN) 10 MG tablet TAKE 1 TABLET BY MOUTH EVERYDAY AT BEDTIME 07/29/21   Cottle, Billey Co., MD  omeprazole (PRILOSEC OTC) 20 MG tablet 1 tablet twice daily. Do not eat for one hour after ingestion of the medication 01/10/11 01/19/12  Marletta Lor, MD    Allergies    Topamax [topiramate]  Review of Systems   Review of Systems  Constitutional:  Positive for activity change, appetite change and fatigue.  HENT: Negative.    Respiratory:   Positive for cough and shortness of breath. Negative for apnea, choking, chest tightness, wheezing and stridor.   Cardiovascular:  Positive for chest pain. Negative for palpitations and leg swelling.  Gastrointestinal:  Positive for abdominal pain and nausea. Negative for abdominal distention, anal bleeding, blood in stool, constipation, diarrhea, rectal pain and vomiting.  Genitourinary: Negative.   Musculoskeletal: Negative.   Skin: Negative.   Neurological:  Positive for weakness (generalized). Negative for dizziness, tremors, seizures, syncope, facial asymmetry, speech difficulty, light-headedness, numbness and headaches.  All other systems reviewed and are negative.  Physical Exam Updated Vital Signs BP 130/89    Pulse 84    Temp 97.9 F (36.6 C)    Resp 16    Ht 5'  8" (1.727 m)    Wt 86.2 kg    SpO2 100%    BMI 28.89 kg/m   Physical Exam Vitals and nursing note reviewed.  Constitutional:      General: He is not in acute distress.    Appearance: He is well-developed. He is not ill-appearing, toxic-appearing or diaphoretic.     Comments: Appears anxious  HENT:     Head: Normocephalic and atraumatic.     Mouth/Throat:     Comments: Posterior oropharynx mild erythema without exudate.  Uvula midline.  No evidence of PTA or RPA.  Sublingual area soft.  No pooling of secretions. Eyes:     Pupils: Pupils are equal, round, and reactive to light.  Neck:     Comments: No neck stiffness or neck rigidity.  No meningismus. Cardiovascular:     Rate and Rhythm: Normal rate and regular rhythm.     Pulses:          Radial pulses are 2+ on the right side and 2+ on the left side.       Dorsalis pedis pulses are 2+ on the right side and 2+ on the left side.       Posterior tibial pulses are 2+ on the right side and 2+ on the left side.     Heart sounds: Normal heart sounds.  Pulmonary:     Effort: Pulmonary effort is normal. No respiratory distress.     Breath sounds: Normal breath sounds. No  decreased breath sounds, wheezing, rhonchi or rales.     Comments: Clear bilaterally, speaks in full sentences without difficulty Chest:     Comments: Nontender, no crepitus or step-off Abdominal:     General: Bowel sounds are normal. There is no distension.     Palpations: Abdomen is soft.     Comments: Mild tenderness to epigastric region, negative Murphy sign.  No overlying skin changes.  No rebound or guarding.  No pain out of proportion to exam.  No midline pulsatile abdominal mass.  Musculoskeletal:        General: Normal range of motion.     Cervical back: Normal range of motion and neck supple.     Right lower leg: No tenderness. No edema.     Left lower leg: No tenderness. No edema.     Comments: Moves all 4 extremities without difficulty.  Compartments soft.  No lower extremity edema.  No bony tenderness  Skin:    General: Skin is warm and dry.     Capillary Refill: Capillary refill takes less than 2 seconds.     Comments: No rash or lesions.  Neurological:     General: No focal deficit present.     Mental Status: He is alert and oriented to person, place, and time.     Comments: Cranial nerves 2-12  grossly intact Intact sensation Equal strength bilaterally Ambulatory without ataxic gait  Psychiatric:     Comments: Appears anxious    ED Results / Procedures / Treatments   Labs (all labs ordered are listed, but only abnormal results are displayed) Labs Reviewed  LIPASE, BLOOD - Abnormal; Notable for the following components:      Result Value   Lipase 56 (*)    All other components within normal limits  COMPREHENSIVE METABOLIC PANEL - Abnormal; Notable for the following components:   Glucose, Bld 129 (*)    AST 9 (*)    All other components within normal limits  RESP PANEL BY RT-PCR (  FLU A&B, COVID) [id number]  GROUP A STREP BY PCR  CBC WITH DIFFERENTIAL/PLATELET  D-DIMER, QUANTITATIVE  TROPONIN I (HIGH SENSITIVITY)  TROPONIN I (HIGH SENSITIVITY)    EKG EKG  Interpretation  Date/Time:  Wednesday August 28 2021 13:18:23 EST Ventricular Rate:  119 PR Interval:  132 QRS Duration: 80 QT Interval:  310 QTC Calculation: 436 R Axis:   70 Text Interpretation: Sinus tachycardia Otherwise normal ECG When compared with ECG of 08-Mar-2014 08:17, PREVIOUS ECG IS PRESENT Confirmed by Pattricia Boss [phone]) on 08/28/2021 2:33:58 PM  Radiology CT Angio Chest PE W and/or Wo Contrast  Result Date: 08/28/2021 CLINICAL DATA:  Shortness of breath and epigastric pain for the past 3 days. EXAM: CT ANGIOGRAPHY CHEST CT ABDOMEN AND PELVIS WITH CONTRAST TECHNIQUE: Multidetector CT imaging of the chest was performed using the standard protocol during bolus administration of intravenous contrast. Multiplanar CT image reconstructions and MIPs were obtained to evaluate the vascular anatomy. Multidetector CT imaging of the abdomen and pelvis was performed using the standard protocol during bolus administration of intravenous contrast. CONTRAST:  29mL OMNIPAQUE IOHEXOL 350 MG/ML SOLN COMPARISON:  CT abdomen pelvis dated June 22, 2013. FINDINGS: CTA CHEST FINDINGS Cardiovascular: Satisfactory opacification of the pulmonary arteries to the segmental level. No evidence of pulmonary embolism. Normal heart size. No pericardial effusion. No thoracic aortic aneurysm. Mediastinum/Nodes: No enlarged mediastinal, hilar, or axillary lymph nodes. Thyroid gland, trachea, and esophagus demonstrate no significant findings. Lungs/Pleura: Minimal dependent atelectasis and scarring in both lungs. No focal consolidation, pleural effusion, or pneumothorax. Musculoskeletal: No chest wall abnormality. No acute or significant osseous findings. Review of the MIP images confirms the above findings. CT ABDOMEN AND PELVIS FINDINGS Hepatobiliary: No focal liver abnormality is seen. No gallstones, gallbladder wall thickening, or biliary dilatation. Pancreas: Unremarkable. No pancreatic ductal dilatation or  surrounding inflammatory changes. Spleen: Normal in size without focal abnormality. Adrenals/Urinary Tract: Adrenal glands are unremarkable. Kidneys are normal, without renal calculi, focal lesion, or hydronephrosis. Bladder is unremarkable. Stomach/Bowel: Stomach is within normal limits. Appendix appears normal. No evidence of bowel wall thickening, distention, or inflammatory changes. Vascular/Lymphatic: No significant vascular findings are present. No enlarged abdominal or pelvic lymph nodes. Reproductive: Prostate is unremarkable. Other: No abdominal wall hernia or abnormality. No abdominopelvic ascites. No pneumoperitoneum. Musculoskeletal: No acute or significant osseous findings. Left paraspinous muscle atrophy. Review of the MIP images confirms the above findings. IMPRESSION: 1. No evidence of pulmonary embolism. No acute intrathoracic process. 2. No acute intra-abdominal process. Electronically Signed   By: Titus Dubin M.D.   On: 08/28/2021 15:58   CT ABDOMEN PELVIS W CONTRAST  Result Date: 08/28/2021 CLINICAL DATA:  Shortness of breath and epigastric pain for the past 3 days. EXAM: CT ANGIOGRAPHY CHEST CT ABDOMEN AND PELVIS WITH CONTRAST TECHNIQUE: Multidetector CT imaging of the chest was performed using the standard protocol during bolus administration of intravenous contrast. Multiplanar CT image reconstructions and MIPs were obtained to evaluate the vascular anatomy. Multidetector CT imaging of the abdomen and pelvis was performed using the standard protocol during bolus administration of intravenous contrast. CONTRAST:  36mL OMNIPAQUE IOHEXOL 350 MG/ML SOLN COMPARISON:  CT abdomen pelvis dated June 22, 2013. FINDINGS: CTA CHEST FINDINGS Cardiovascular: Satisfactory opacification of the pulmonary arteries to the segmental level. No evidence of pulmonary embolism. Normal heart size. No pericardial effusion. No thoracic aortic aneurysm. Mediastinum/Nodes: No enlarged mediastinal, hilar, or  axillary lymph nodes. Thyroid gland, trachea, and esophagus demonstrate no significant findings. Lungs/Pleura: Minimal dependent atelectasis and scarring in  both lungs. No focal consolidation, pleural effusion, or pneumothorax. Musculoskeletal: No chest wall abnormality. No acute or significant osseous findings. Review of the MIP images confirms the above findings. CT ABDOMEN AND PELVIS FINDINGS Hepatobiliary: No focal liver abnormality is seen. No gallstones, gallbladder wall thickening, or biliary dilatation. Pancreas: Unremarkable. No pancreatic ductal dilatation or surrounding inflammatory changes. Spleen: Normal in size without focal abnormality. Adrenals/Urinary Tract: Adrenal glands are unremarkable. Kidneys are normal, without renal calculi, focal lesion, or hydronephrosis. Bladder is unremarkable. Stomach/Bowel: Stomach is within normal limits. Appendix appears normal. No evidence of bowel wall thickening, distention, or inflammatory changes. Vascular/Lymphatic: No significant vascular findings are present. No enlarged abdominal or pelvic lymph nodes. Reproductive: Prostate is unremarkable. Other: No abdominal wall hernia or abnormality. No abdominopelvic ascites. No pneumoperitoneum. Musculoskeletal: No acute or significant osseous findings. Left paraspinous muscle atrophy. Review of the MIP images confirms the above findings. IMPRESSION: 1. No evidence of pulmonary embolism. No acute intrathoracic process. 2. No acute intra-abdominal process. Electronically Signed   By: Titus Dubin M.D.   On: 08/28/2021 15:58   DG Chest Portable 1 View  Result Date: 08/28/2021 CLINICAL DATA:  Cough EXAM: PORTABLE CHEST 1 VIEW COMPARISON:  2020 FINDINGS: The heart size and mediastinal contours are within normal limits. Both lungs are clear. No pleural effusion. The visualized skeletal structures are unremarkable. IMPRESSION: No acute process in the chest. Electronically Signed   By: Macy Mis M.D.   On:  08/28/2021 13:48    Procedures Procedures   Medications Ordered in ED Medications  alum & mag hydroxide-simeth (MAALOX/MYLANTA) 200-200-20 MG/5ML suspension 30 mL (30 mLs Oral Given 08/28/21 1356)    And  lidocaine (XYLOCAINE) 2 % viscous mouth solution 15 mL (15 mLs Oral Given 08/28/21 1356)  LORazepam (ATIVAN) injection 1 mg (1 mg Intravenous Given 08/28/21 1446)  sodium chloride 0.9 % bolus 1,000 mL (1,000 mLs Intravenous New Bag/Given 08/28/21 1449)  iohexol (OMNIPAQUE) 350 MG/ML injection 80 mL (80 mLs Intravenous Contrast Given 08/28/21 1531)    ED Course  I have reviewed the triage vital signs and the nursing notes.  Pertinent labs & imaging results that were available during my care of the patient were reviewed by me and considered in my medical decision making (see chart for details).  Pleasant 50 year old here for evaluation of multiple complaints.  He is afebrile, nonseptic, not ill-appearing  SOB/CP>> feels tight in the chest.  Nonexertional.  Is pleuritic in nature.  Associated cough.  No hemoptysis.  Does not appear fluid overloaded.  Does appear very anxious.  Troponin negative, EKG does show tachycardia however no ischemic changes, CTA negative for PE, infection, cardiomegaly, pulm edema, pneumothorax suspect shortness of breath due to anxiety.  Symptoms improved after given Ativan  Dysphagia>> seems to be a longstanding issue per patient.  Worse with solid food.  No evidence of p.o. infectious process.  No angioedema.  No stridor.  Was given viscous lidocaine here and able to tolerate p.o. intake.  He has never had prior EGD and no history of strictures.  Do not feel he needs emergent GI consult currently however would benefit from outpatient GI follow-up.  He has nonfocal neuro exam low suspicion for acute neurologic cause of his dysphagia.  He is tolerating p.o. intake, low suspicion for food bolus, epiglottitis.   Epigastric Pain>> lipase mildly elevated, denies any EtOH  use, history of pancreatitis, chronic NSAID use.  No melena blood per rectum.  CT abdomen pelvis does not show any significant  abnormality.  We will have him follow-up with PCP for this.  He has no current abdominal pain.  Does endorse some reflux.  Discussed starting on OTC PPI until he follows up with GI.  He is agreeable.  Work-up personally reviewed and interpreted, no significant abnormality.  Chest pain is not likely of cardiac or pulmonary etiology d/t presentation, PERC negative, VSS, no tracheal deviation, no JVD or new murmur, RRR, breath sounds equal bilaterally, Patient does not meet the SIRS or Sepsis criteria.  On repeat exam patient does not have a surgical abdomin and there are no peritoneal signs.  No indication of appendicitis, bowel obstruction, bowel perforation, cholecystitis, diverticulitis, AAA, dissection.     We will have him follow-up with PCP/specialist, return for new or worsening symptoms.    MDM Rules/Calculators/A&P                             Final Clinical Impression(s) / ED Diagnoses Final diagnoses:  SOB (shortness of breath)  Anxiety  Epigastric pain  Dysphagia, unspecified type    Rx / DC Orders ED Discharge Orders          Ordered    lidocaine (XYLOCAINE) 2 % solution  As needed        08/28/21 1726                   Marieanne Marxen A, PA-C 08/28/21 1737    Pattricia Boss, MD 08/29/21 [phone]

## 2021-08-28 NOTE — ED Triage Notes (Signed)
Pt arrives to ED with c/o shortness of breath. This started x3 days ago.  Associated symptoms include hiccups, burping, lethargy, dyspnea at rest, night time cough.

## 2021-08-29 ENCOUNTER — Other Ambulatory Visit: Payer: Self-pay | Admitting: Psychiatry

## 2021-08-29 ENCOUNTER — Other Ambulatory Visit: Payer: Self-pay

## 2021-08-29 DIAGNOSIS — F4010 Social phobia, unspecified: Secondary | ICD-10-CM

## 2021-08-29 DIAGNOSIS — F3131 Bipolar disorder, current episode depressed, mild: Secondary | ICD-10-CM

## 2021-08-29 DIAGNOSIS — F410 Panic disorder [episodic paroxysmal anxiety] without agoraphobia: Secondary | ICD-10-CM

## 2021-08-29 DIAGNOSIS — F451 Undifferentiated somatoform disorder: Secondary | ICD-10-CM

## 2021-08-29 DIAGNOSIS — G4733 Obstructive sleep apnea (adult) (pediatric): Secondary | ICD-10-CM

## 2021-08-29 DIAGNOSIS — Z9989 Dependence on other enabling machines and devices: Secondary | ICD-10-CM

## 2021-08-29 NOTE — Telephone Encounter (Signed)
Last filled 11/22 appt on 09/23/20

## 2021-08-29 NOTE — Telephone Encounter (Signed)
Warning letter requested per JC, if not already sent

## 2021-08-29 NOTE — Telephone Encounter (Signed)
Its pended waiting on dr cottle to sign

## 2021-08-29 NOTE — Telephone Encounter (Signed)
Did we get the Alprazolam pended too? I see Ambien is already sent.

## 2021-08-29 NOTE — Telephone Encounter (Signed)
Pended.

## 2021-08-29 NOTE — Telephone Encounter (Addendum)
Per JC ok to schedule apt. Apt  10/04/21  and on canc list. Please send refills for Xanax & Ambien.

## 2021-09-03 ENCOUNTER — Ambulatory Visit: Payer: BC Managed Care – PPO | Admitting: Family Medicine

## 2021-09-03 NOTE — Progress Notes (Deleted)
HPI:  [name] is a 51 y.o. male, who is here today to follow on recent visit. Telephone visit on 08/19/21, when he was c/o sore throat, stridor,wheezing,and dysphonia. He was treated with Albuterol in and Dexamethasone. Evaluated in the ED on 08/28/21 for SOB + other multiple complaints that included CP,generalized weakness,abdominal pain. *** Lab Results  Component Value Date   LIPASE 56 (H) 08/28/2021   Lab Results  Component Value Date   ALT 8 08/28/2021   AST 9 (L) 08/28/2021   ALKPHOS 79 08/28/2021   BILITOT 0.4 08/28/2021   Lab Results  Component Value Date   CREATININE 0.73 08/28/2021   BUN 15 08/28/2021   NA 135 08/28/2021   K 3.9 08/28/2021   CL 102 08/28/2021   CO2 26 08/28/2021   Lab Results  Component Value Date   WBC 8.0 08/28/2021   HGB 15.5 08/28/2021   HCT 45.9 08/28/2021   MCV 85.8 08/28/2021   PLT 340 08/28/2021   Troponin x 2 and D dimer in normal range. Rapid strep negative.  Abdomen/pelvic CT and chest CTA on 08/28/21: *** 1. No evidence of pulmonary embolism. No acute intrathoracic process. 2. No acute intra-abdominal process. CXR: ***No acute process in the chest.  Discharged on Lidocaine 2% for sore throat. *** Review of Systems Rest see pertinent positives and negatives per HPI.  Current Outpatient Medications on File Prior to Visit  Medication Sig Dispense Refill   albuterol (VENTOLIN HFA) 108 (90 Base) MCG/ACT inhaler TAKE 2 PUFFS BY MOUTH EVERY 6 HOURS AS NEEDED FOR WHEEZE OR SHORTNESS OF BREATH 8.5 each 1   ALPRAZolam (XANAX) 1 MG tablet TAKE 1 TABLET BY MOUTH THREE TIMES A DAY AS NEEDED FOR ANXIETY, PANIC ATTACKS, OR AGITATION 90 tablet 0   atorvastatin (LIPITOR) 40 MG tablet TAKE 1 TABLET BY MOUTH EVERY DAY 90 tablet 3   gabapentin (NEURONTIN) 300 MG capsule TAKE 1 TO 3 CAPSULES BY MOUTH 2-3 HOURS BEFORE BEDTIME *KEEP APPT* 60 capsule 0   lamoTRIgine (LAMICTAL) 150 MG tablet TAKE 2 TABLETS BY MOUTH AT BEDTIME. 30  tablet 0   lidocaine (XYLOCAINE) 2 % solution Use as directed 15 mLs in the mouth or throat as needed for mouth pain. 100 mL 0   lubiprostone (AMITIZA) 24 MCG capsule TAKE 1 CAPSULE BY MOUTH TWICE A DAY WITH A MEAL 60 capsule 3   metoCLOPramide (REGLAN) 5 MG tablet TAKE 1 TABLET BY MOUTH 3 TIMES DAILY BEFORE MEALS. 270 tablet 1   ondansetron (ZOFRAN) 4 MG tablet TAKE 1 TABLET BY MOUTH TWICE A DAY AS NEEDED FOR NAUSEA/VOMITING 15 tablet 0   prochlorperazine (COMPAZINE) 10 MG tablet TAKE 1 TABLET BY MOUTH EVERY DAY AS NEEDED 30 tablet 1   QUEtiapine (SEROQUEL) 200 MG tablet TAKE 1 TABLET BY MOUTH TWICE A DAY 60 tablet 0   zolpidem (AMBIEN) 10 MG tablet TAKE 1 TABLET BY MOUTH EVERYDAY AT BEDTIME 30 tablet 0   [DISCONTINUED] omeprazole (PRILOSEC OTC) 20 MG tablet 1 tablet twice daily. Do not eat for one hour after ingestion of the medication 60 tablet 11   No current facility-administered medications on file prior to visit.   Past Medical History:  Diagnosis Date   ALLERGIC RHINITIS 05/10/2009   Allergy    anemia   Anxiety    Bipolar disorder ([place])    see medications patient to bring   Chronic headaches    DDD (degenerative disc disease)    DEPRESSION 05/10/2009  Gastroparesis    GERD 05/10/2009   LIBIDO, DECREASED 05/10/2009   Neuromuscular disorder ([place])    pt. denies 08/01/16   Osgood-Schlatter's disease    SLEEP APNEA, OBSTRUCTIVE 06/12/2009   CPAP not utilized after 30 lb. weight loss   SMOKER 06/12/2009   TESTICULAR HYPOFUNCTION 06/12/2009   WEIGHT GAIN 05/10/2009   Allergies  Allergen Reactions   Topamax [Topiramate]     Sleep walking, hallucinations, "I get mean, can't help it"   Social History   Socioeconomic History   Marital status: Married    Spouse name: Sean Wu   Number of children: 0   Years of education: 14   Highest education level: Not on file  Occupational History   Occupation: Unemployeed  Tobacco Use   Smoking status: Former    Packs/day: 0.50    Years:  15.00    Pack years: 7.50    Types: Cigarettes   Smokeless tobacco: Former    Types: Chew    Quit date: 09/01/1998   Tobacco comments:    tobacco info given 08/01/13  Vaping Use   Vaping Use: Never used  Substance and Sexual Activity   Alcohol use: Not Currently    Alcohol/week: 1.0 standard drink    Types: 1 Cans of beer per week    Comment: social, rare   Drug use: No   Sexual activity: Yes  Other Topics Concern   Not on file  Social History Narrative   Lives with wife at home   Caffeine use- tea, 3 glasses daily   Social Determinants of Health   Financial Resource Strain: Not on file  Food Insecurity: Not on file  Transportation Needs: Not on file  Physical Activity: Not on file  Stress: Not on file  Social Connections: Not on file    There were no vitals filed for this visit. There is no height or weight on file to calculate BMI.  Physical Exam  ASSESSMENT AND PLAN:   There are no diagnoses linked to this encounter.  No orders of the defined types were placed in this encounter.   No problem-specific Assessment & Plan notes found for this encounter.   No follow-ups on file.   Aslynn Brunetti G. [place], MD  [facility]. [place] office.

## 2021-09-23 ENCOUNTER — Ambulatory Visit (INDEPENDENT_AMBULATORY_CARE_PROVIDER_SITE_OTHER): Payer: BC Managed Care – PPO | Admitting: Psychiatry

## 2021-09-23 ENCOUNTER — Other Ambulatory Visit: Payer: Self-pay

## 2021-09-23 ENCOUNTER — Encounter: Payer: Self-pay | Admitting: Psychiatry

## 2021-09-23 DIAGNOSIS — F4010 Social phobia, unspecified: Secondary | ICD-10-CM | POA: Diagnosis not present

## 2021-09-23 DIAGNOSIS — Z9989 Dependence on other enabling machines and devices: Secondary | ICD-10-CM

## 2021-09-23 DIAGNOSIS — F410 Panic disorder [episodic paroxysmal anxiety] without agoraphobia: Secondary | ICD-10-CM

## 2021-09-23 DIAGNOSIS — G4733 Obstructive sleep apnea (adult) (pediatric): Secondary | ICD-10-CM

## 2021-09-23 DIAGNOSIS — F451 Undifferentiated somatoform disorder: Secondary | ICD-10-CM | POA: Diagnosis not present

## 2021-09-23 DIAGNOSIS — F3131 Bipolar disorder, current episode depressed, mild: Secondary | ICD-10-CM | POA: Diagnosis not present

## 2021-09-23 MED ORDER — LAMOTRIGINE 150 MG PO TABS: 300.0000 mg | ORAL_TABLET | Freq: Every day | ORAL | 0 refills | Status: DC

## 2021-09-23 MED ORDER — ZOLPIDEM TARTRATE 10 MG PO TABS: ORAL_TABLET | ORAL | 0 refills | Status: DC

## 2021-09-23 MED ORDER — ALPRAZOLAM 1 MG PO TABS: ORAL_TABLET | ORAL | 0 refills | Status: DC

## 2021-09-23 MED ORDER — QUETIAPINE FUMARATE ER 300 MG PO TB24: 300.0000 mg | ORAL_TABLET | Freq: Every evening | ORAL | 2 refills | Status: DC

## 2021-09-23 NOTE — Progress Notes (Signed)
Sean Wu [id number] 10-26-1970 51 y.o.  Subjective:   Patient ID:  Sean Wu is a 51 y.o. (DOB 01-15-1971) male.  Chief Complaint:  Chief Complaint  Patient presents with   Depression   Sleeping Problem   Anxiety    HPI Sean Wu presents to the office today for follow-up of mood disturbance, anxiety, and sleep disturbance.   He reports that his mother has started having rapid cognitive decline. He recently had COVID and has continued to have severe fatigue. He also had to have dental surgery. He reports increased social anxiety. He reports that he has been having frequent panic attacks.   "I feel a little depressed lately... but it could be from all of this." He denies persistent depression. He has noticed he is over-thinking more. He reports that he has been missing appointments. Low energy and motivation. He reports that he has been sleeping more and is unsure of sleep amount. Sleep schedule is disrupted. He reports poor concentration. Denies manic s/s. Diminished interest in things. He reports that about a week ago he had a feeling of "emptiness." Has been more withdrawn. Appetite has been less and reports that he has lost about 8 lbs. Denies SI.   Wife works from home.   He and mother are very close. He reports that he worries about her frequently.   He reports taking Xanax prn less than daily.   He reports that he occasionally will take Ambien instead of Seroquel. He was taking Seroquel 200 mg QHS and has increased to 400 mg QHS a few weeks ago.  He reports that this can cause excessive somnolence.   Past Psychiatric Medication Trials: Seroquel- Helpful most nights for sleep.  Saphris Rexulti Ambien- Uses prn. Reports that it works 8 out 10 nights Belsomra-Ineffective Topamax-Adverse reaction Lamictal- Helpful for mood Gabapentin Depakote- Wt gain Cymbalta Paxil Xanax -helpful for anxiety Clonidine Librium Trazodone  [place] Office  Visit from 09/23/2021 in [place] Visit from 11/23/2020 in [place] Total Score 1 [place] [facility] Visit from 01/16/2015 in [facility] [place]  Total Score (max 30 points ) 29      PHQ2-9    [place] Visit from 02/22/2016 in [place] [facility] [place]  PHQ-2 Total Score 1      [place] ED from 08/28/2021 in [facility]  C-SSRS RISK CATEGORY No Risk        Review of Systems:  Review of Systems  Constitutional:  Positive for fatigue.  HENT:  Positive for dental problem, postnasal drip and trouble swallowing.   Musculoskeletal:  Negative for gait problem.  Psychiatric/Behavioral:         Please refer to HPI   Medications: I have reviewed the patient's current medications.  Current Outpatient Medications  Medication Sig Dispense Refill   atorvastatin (LIPITOR) 40 MG tablet TAKE 1 TABLET BY MOUTH EVERY DAY 90 tablet 3   gabapentin (NEURONTIN) 300 MG capsule TAKE 1 TO 3 CAPSULES BY MOUTH 2-3 HOURS BEFORE BEDTIME *KEEP APPT* 60 capsule 0   metoCLOPramide (REGLAN) 5 MG tablet TAKE 1 TABLET BY MOUTH 3 TIMES DAILY BEFORE MEALS. 270 tablet 1   prochlorperazine (COMPAZINE) 10 MG tablet TAKE 1 TABLET BY MOUTH EVERY DAY AS NEEDED 30 tablet 1   QUEtiapine (SEROQUEL XR) 300 MG 24 hr tablet Take 1 tablet (300 mg total)  by mouth every evening. 30 tablet 2   albuterol (VENTOLIN HFA) 108 (90 Base) MCG/ACT inhaler TAKE 2 PUFFS BY MOUTH EVERY 6 HOURS AS NEEDED FOR WHEEZE OR SHORTNESS OF BREATH 8.5 each 1   [START ON 09/26/2021] ALPRAZolam (XANAX) 1 MG tablet TAKE 1 TABLET BY MOUTH THREE TIMES A DAY AS NEEDED FOR ANXIETY, PANIC ATTACKS, OR AGITATION 270 tablet 0   lamoTRIgine (LAMICTAL) 150 MG tablet Take 2 tablets (300 mg total) by mouth at bedtime. 180 tablet 0   lidocaine (XYLOCAINE) 2 % solution Use as directed 15 mLs in the mouth or throat as needed for  mouth pain. 100 mL 0   lubiprostone (AMITIZA) 24 MCG capsule TAKE 1 CAPSULE BY MOUTH TWICE A DAY WITH A MEAL (Patient not taking: Reported on 09/23/2021) 60 capsule 3   ondansetron (ZOFRAN) 4 MG tablet TAKE 1 TABLET BY MOUTH TWICE A DAY AS NEEDED FOR NAUSEA/VOMITING (Patient not taking: Reported on 09/23/2021) 15 tablet 0   [START ON 09/27/2021] zolpidem (AMBIEN) 10 MG tablet TAKE 1 TABLET BY MOUTH EVERYDAY AT BEDTIME 90 tablet 0   No current facility-administered medications for this visit.    Medication Side Effects: None  Allergies:  Allergies  Allergen Reactions   Topamax [Topiramate]     Sleep walking, hallucinations, "I get mean, can't help it"    Past Medical History:  Diagnosis Date   ALLERGIC RHINITIS 05/10/2009   Allergy    anemia   Anxiety    Bipolar disorder ([place])    see medications patient to bring   Chronic headaches    DDD (degenerative disc disease)    DEPRESSION 05/10/2009   Gastroparesis    GERD 05/10/2009   LIBIDO, DECREASED 05/10/2009   Neuromuscular disorder ([place])    pt. denies 08/01/16   Osgood-Schlatter's disease    SLEEP APNEA, OBSTRUCTIVE 06/12/2009   CPAP not utilized after 30 lb. weight loss   SMOKER 06/12/2009   TESTICULAR HYPOFUNCTION 06/12/2009   WEIGHT GAIN 05/10/2009    Past Medical History, Surgical history, Social history, and Family history were reviewed and updated as appropriate.   Please see review of systems for further details on the patient's review from today.   Objective:   Physical Exam:  There were no vitals taken for this visit.  Physical Exam Constitutional:      General: He is not in acute distress. Musculoskeletal:        General: No deformity.  Neurological:     Mental Status: He is alert and oriented to person, place, and time.     Coordination: Coordination normal.  Psychiatric:        Attention and Perception: Attention and perception normal. He does not perceive auditory or visual hallucinations.        Mood and  Affect: Mood is anxious and depressed. Affect is not labile, blunt, angry or inappropriate.        Speech: Speech normal.        Behavior: Behavior normal.        Thought Content: Thought content normal. Thought content is not paranoid or delusional. Thought content does not include homicidal or suicidal ideation. Thought content does not include homicidal or suicidal plan.        Cognition and Memory: Cognition and memory normal.        Judgment: Judgment normal.     Comments: Insight intact    Lab Review:     Component Value Date/Time   NA 135 08/28/2021 1356  K 3.9 08/28/2021 1356   CL 102 08/28/2021 1356   CO2 26 08/28/2021 1356   GLUCOSE 129 (H) 08/28/2021 1356   BUN 15 08/28/2021 1356   CREATININE 0.73 08/28/2021 1356   CREATININE 0.94 04/25/2020 0851   CALCIUM 9.0 08/28/2021 1356   PROT 7.0 08/28/2021 1356   ALBUMIN 3.7 08/28/2021 1356   AST 9 (L) 08/28/2021 1356   ALT 8 08/28/2021 1356   ALKPHOS 79 08/28/2021 1356   BILITOT 0.4 08/28/2021 1356   GFRNONAA >60 08/28/2021 1356   GFRNONAA 93 04/25/2020 0851   GFRAA 108 04/25/2020 0851       Component Value Date/Time   WBC 8.0 08/28/2021 1356   RBC 5.35 08/28/2021 1356   HGB 15.5 08/28/2021 1356   HCT 45.9 08/28/2021 1356   PLT 340 08/28/2021 1356   MCV 85.8 08/28/2021 1356   MCH 29.0 08/28/2021 1356   MCHC 33.8 08/28/2021 1356   RDW 14.0 08/28/2021 1356   LYMPHSABS 1.9 08/28/2021 1356   MONOABS 0.6 08/28/2021 1356   EOSABS 0.3 08/28/2021 1356   BASOSABS 0.1 08/28/2021 1356    No results found for: POCLITH, LITHIUM   No results found for: PHENYTOIN, PHENOBARB, VALPROATE, CBMZ   .res Assessment: Plan:   Pt seen for 30 minutes and time spent discussing recent depressive s/s and possible treatment options. Discussed that typically Seroquel doses less than 300 mg are not effective for mood and that taking Seroquel 200 mg could have contributed to recent depressive s/s, along with situational stressors. Discussed  option of Seroquel XR since pt reports that he has difficulty sleeping with lower doses of Seroquel and has excessive daytime somnolence with higher doses of Seroquel. Discussed taking Seroquel XR about 4 hours before bedtime. Pt agrees to trial of Seroquel XR 300 mg in the evening for mood and insomnia.  Continue Lamictal 300 mg po qd for mood s/s.  Continue Ambien 10 mg po QHS prn insomnia.  Continue Xanax 1 mg TID prn anxiety.  Pt to follow-up in 2 months or sooner if clinically indicated.  Patient advised to contact office with any questions, adverse effects, or acute worsening in signs and symptoms.   Jenson was seen today for depression, sleeping problem and anxiety.  Diagnoses and all orders for this visit:  Bipolar I disorder, mild, current or most recent episode depressed, with mixed features (HCC) -     QUEtiapine (SEROQUEL XR) 300 MG 24 hr tablet; Take 1 tablet (300 mg total) by mouth every evening. -     ALPRAZolam (XANAX) 1 MG tablet; TAKE 1 TABLET BY MOUTH THREE TIMES A DAY AS NEEDED FOR ANXIETY, PANIC ATTACKS, OR AGITATION -     lamoTRIgine (LAMICTAL) 150 MG tablet; Take 2 tablets (300 mg total) by mouth at bedtime. -     zolpidem (AMBIEN) 10 MG tablet; TAKE 1 TABLET BY MOUTH EVERYDAY AT BEDTIME  Social anxiety disorder -     ALPRAZolam (XANAX) 1 MG tablet; TAKE 1 TABLET BY MOUTH THREE TIMES A DAY AS NEEDED FOR ANXIETY, PANIC ATTACKS, OR AGITATION -     zolpidem (AMBIEN) 10 MG tablet; TAKE 1 TABLET BY MOUTH EVERYDAY AT BEDTIME  Panic disorder -     ALPRAZolam (XANAX) 1 MG tablet; TAKE 1 TABLET BY MOUTH THREE TIMES A DAY AS NEEDED FOR ANXIETY, PANIC ATTACKS, OR AGITATION  Somatic symptom disorder -     lamoTRIgine (LAMICTAL) 150 MG tablet; Take 2 tablets (300 mg total) by mouth at bedtime. -  zolpidem (AMBIEN) 10 MG tablet; TAKE 1 TABLET BY MOUTH EVERYDAY AT BEDTIME  OSA on CPAP -     zolpidem (AMBIEN) 10 MG tablet; TAKE 1 TABLET BY MOUTH EVERYDAY AT BEDTIME      Please see After Visit Summary for patient specific instructions.  Future Appointments  Date Time Provider [place]  11/21/2021  1:30 PM Thayer Headings, PMHNP [facility] None    No orders of the defined types were placed in this encounter.   -------------------------------

## 2021-10-04 ENCOUNTER — Ambulatory Visit: Payer: BC Managed Care – PPO | Admitting: Psychiatry

## 2021-10-16 ENCOUNTER — Other Ambulatory Visit: Payer: Self-pay | Admitting: Psychiatry

## 2021-10-16 DIAGNOSIS — F3131 Bipolar disorder, current episode depressed, mild: Secondary | ICD-10-CM

## 2021-10-20 ENCOUNTER — Other Ambulatory Visit: Payer: Self-pay | Admitting: Psychiatry

## 2021-10-20 DIAGNOSIS — F410 Panic disorder [episodic paroxysmal anxiety] without agoraphobia: Secondary | ICD-10-CM

## 2021-10-20 DIAGNOSIS — F3131 Bipolar disorder, current episode depressed, mild: Secondary | ICD-10-CM

## 2021-10-20 DIAGNOSIS — F4010 Social phobia, unspecified: Secondary | ICD-10-CM

## 2021-11-21 ENCOUNTER — Ambulatory Visit: Payer: BC Managed Care – PPO | Admitting: Psychiatry

## 2021-12-05 ENCOUNTER — Other Ambulatory Visit: Payer: Self-pay | Admitting: Family Medicine

## 2021-12-05 ENCOUNTER — Ambulatory Visit: Payer: BC Managed Care – PPO | Admitting: Psychiatry

## 2021-12-05 DIAGNOSIS — R110 Nausea: Secondary | ICD-10-CM

## 2021-12-26 ENCOUNTER — Other Ambulatory Visit: Payer: Self-pay | Admitting: Psychiatry

## 2021-12-26 DIAGNOSIS — F4010 Social phobia, unspecified: Secondary | ICD-10-CM

## 2021-12-26 DIAGNOSIS — F410 Panic disorder [episodic paroxysmal anxiety] without agoraphobia: Secondary | ICD-10-CM

## 2021-12-26 DIAGNOSIS — G4733 Obstructive sleep apnea (adult) (pediatric): Secondary | ICD-10-CM

## 2021-12-26 DIAGNOSIS — F451 Undifferentiated somatoform disorder: Secondary | ICD-10-CM

## 2021-12-26 DIAGNOSIS — F3131 Bipolar disorder, current episode depressed, mild: Secondary | ICD-10-CM

## 2021-12-27 ENCOUNTER — Other Ambulatory Visit: Payer: Self-pay | Admitting: Psychiatry

## 2021-12-27 DIAGNOSIS — F4010 Social phobia, unspecified: Secondary | ICD-10-CM

## 2021-12-27 DIAGNOSIS — G2581 Restless legs syndrome: Secondary | ICD-10-CM

## 2021-12-27 DIAGNOSIS — F3131 Bipolar disorder, current episode depressed, mild: Secondary | ICD-10-CM

## 2021-12-27 DIAGNOSIS — F410 Panic disorder [episodic paroxysmal anxiety] without agoraphobia: Secondary | ICD-10-CM

## 2022-01-12 ENCOUNTER — Other Ambulatory Visit: Payer: Self-pay | Admitting: Psychiatry

## 2022-01-12 DIAGNOSIS — F4010 Social phobia, unspecified: Secondary | ICD-10-CM

## 2022-01-12 DIAGNOSIS — G2581 Restless legs syndrome: Secondary | ICD-10-CM

## 2022-03-25 ENCOUNTER — Other Ambulatory Visit: Payer: Self-pay | Admitting: Psychiatry

## 2022-03-25 DIAGNOSIS — F451 Undifferentiated somatoform disorder: Secondary | ICD-10-CM

## 2022-03-25 DIAGNOSIS — F3131 Bipolar disorder, current episode depressed, mild: Secondary | ICD-10-CM

## 2022-03-25 NOTE — Telephone Encounter (Signed)
Please call patient to schedule appt. He keeps canceling.

## 2022-03-28 NOTE — Telephone Encounter (Signed)
LVM to schedule follow up appt

## 2022-03-29 ENCOUNTER — Other Ambulatory Visit: Payer: Self-pay | Admitting: Family Medicine

## 2022-03-29 ENCOUNTER — Other Ambulatory Visit: Payer: Self-pay | Admitting: Psychiatry

## 2022-03-29 DIAGNOSIS — R110 Nausea: Secondary | ICD-10-CM

## 2022-03-29 DIAGNOSIS — F3131 Bipolar disorder, current episode depressed, mild: Secondary | ICD-10-CM

## 2022-03-31 NOTE — Telephone Encounter (Signed)
Please schedule appt

## 2022-04-01 ENCOUNTER — Other Ambulatory Visit: Payer: Self-pay | Admitting: Psychiatry

## 2022-04-01 ENCOUNTER — Ambulatory Visit: Payer: BC Managed Care – PPO | Admitting: Psychiatry

## 2022-04-01 DIAGNOSIS — F451 Undifferentiated somatoform disorder: Secondary | ICD-10-CM

## 2022-04-01 DIAGNOSIS — G4733 Obstructive sleep apnea (adult) (pediatric): Secondary | ICD-10-CM

## 2022-04-01 DIAGNOSIS — F3131 Bipolar disorder, current episode depressed, mild: Secondary | ICD-10-CM

## 2022-04-01 DIAGNOSIS — F4010 Social phobia, unspecified: Secondary | ICD-10-CM

## 2022-04-01 MED ORDER — ZOLPIDEM TARTRATE 10 MG PO TABS: ORAL_TABLET | ORAL | 0 refills | Status: DC

## 2022-04-01 NOTE — Telephone Encounter (Signed)
Pritchard called this morning at 10:00 to cancel his appt today because he is sick and didn't feel he should come to the appt.  He RS for 8/7.  He needs refill of his [place].  Send to [facility] on [address].

## 2022-04-01 NOTE — Telephone Encounter (Signed)
Pt is scheduled 8/7

## 2022-04-06 ENCOUNTER — Other Ambulatory Visit: Payer: Self-pay | Admitting: Psychiatry

## 2022-04-06 DIAGNOSIS — F4010 Social phobia, unspecified: Secondary | ICD-10-CM

## 2022-04-06 DIAGNOSIS — F410 Panic disorder [episodic paroxysmal anxiety] without agoraphobia: Secondary | ICD-10-CM

## 2022-04-06 DIAGNOSIS — F3131 Bipolar disorder, current episode depressed, mild: Secondary | ICD-10-CM

## 2022-04-07 ENCOUNTER — Ambulatory Visit: Payer: BC Managed Care – PPO | Admitting: Psychiatry

## 2022-04-07 ENCOUNTER — Encounter: Payer: Self-pay | Admitting: Psychiatry

## 2022-04-07 DIAGNOSIS — G4733 Obstructive sleep apnea (adult) (pediatric): Secondary | ICD-10-CM | POA: Diagnosis not present

## 2022-04-07 DIAGNOSIS — Z9989 Dependence on other enabling machines and devices: Secondary | ICD-10-CM

## 2022-04-07 DIAGNOSIS — F3131 Bipolar disorder, current episode depressed, mild: Secondary | ICD-10-CM

## 2022-04-07 DIAGNOSIS — F4010 Social phobia, unspecified: Secondary | ICD-10-CM | POA: Diagnosis not present

## 2022-04-07 DIAGNOSIS — F451 Undifferentiated somatoform disorder: Secondary | ICD-10-CM | POA: Diagnosis not present

## 2022-04-07 DIAGNOSIS — F410 Panic disorder [episodic paroxysmal anxiety] without agoraphobia: Secondary | ICD-10-CM

## 2022-04-07 MED ORDER — QUETIAPINE FUMARATE 200 MG PO TABS: 200.0000 mg | ORAL_TABLET | Freq: Every day | ORAL | 1 refills | Status: DC

## 2022-04-07 MED ORDER — ZOLPIDEM TARTRATE 10 MG PO TABS: ORAL_TABLET | ORAL | 0 refills | Status: DC

## 2022-04-07 MED ORDER — ALPRAZOLAM 1 MG PO TABS: ORAL_TABLET | ORAL | 1 refills | Status: DC

## 2022-04-07 MED ORDER — LAMOTRIGINE 150 MG PO TABS: 300.0000 mg | ORAL_TABLET | Freq: Every day | ORAL | 1 refills | Status: DC

## 2022-04-07 NOTE — Progress Notes (Signed)
Sean Wu [id number] 1970-12-25 51 y.o.  Subjective:   Patient ID:  Sean Wu is a 51 y.o. (DOB Jun 23, 1971) male.  Chief Complaint:  Chief Complaint  Patient presents with   Sleeping Problem   Anxiety   Follow-up    Mood disturbance    HPI Sean Wu presents to the office today for follow-up of mood disturbance, anxiety, and sleep disturbance. Sean Wu reports difficulty with sleep and feeling tired. Sean Wu reports that his sleep was better with Seroquel IR 200 mg. Sean Wu reports minimal sleep. Sean Wu reports that Sean Wu has had anxiety and some agitation. Denies depressed mood. Sean Wu reports some worry in response to his health issues and mother having health issues. Denies panic and reports that Sean Wu is "over-thinking." Sean Wu reports that his energy and motivation have been ok. Denies impulsive or risky behavior. Appetite has been off with gastroparesis. Occ difficulty with concentration. Denies SI.   Mother has been having health issues to include rapid decline in cognition. She is in [place] and Sean Wu has been visiting her mother.   Has been taking Alprazolam prn regularly. Alprazolam last filled 12/30/21 for 90 day supply. Ambien last filled 04/01/22 for 90 day supply.     Past Psychiatric Medication Trials: Seroquel- Helpful most nights for sleep.  Saphris Rexulti Ambien- Uses prn. Reports that it works 8 out 10 nights Belsomra-Ineffective Topamax-Adverse reaction Lamictal- Helpful for mood Gabapentin Depakote- Wt gain Cymbalta Paxil Xanax -helpful for anxiety Clonidine Librium Trazodone  [place] Office Visit from 04/07/2022 in [place] Office Visit from 09/23/2021 in [place] Visit from 11/23/2020 in [place] Total Score '1 1 1      [facility] [place] Office Visit from 01/16/2015 in [facility]  Total Score (max 30 points ) 29      PHQ2-9    [place] Visit  from 02/22/2016 in [place] [facility]  PHQ-2 Total Score 1      [place] [facility] from 08/28/2021 in [facility]  C-SSRS RISK CATEGORY No Risk        Review of Systems:  Review of Systems  Gastrointestinal:  Positive for nausea and vomiting.  Musculoskeletal:  Negative for gait problem.  Neurological:  Negative for tremors.  Psychiatric/Behavioral:         Please refer to HPI    Medications: I have reviewed the patient's current medications.  Current Outpatient Medications  Medication Sig Dispense Refill   albuterol (VENTOLIN HFA) 108 (90 Base) MCG/ACT inhaler TAKE 2 PUFFS BY MOUTH EVERY 6 HOURS AS NEEDED FOR WHEEZE OR SHORTNESS OF BREATH 8.5 each 1   metoCLOPramide (REGLAN) 5 MG tablet TAKE 1 TABLET BY MOUTH 3 TIMES DAILY BEFORE MEALS. 270 tablet 1   prochlorperazine (COMPAZINE) 10 MG tablet TAKE 1 TABLET BY MOUTH EVERY DAY AS NEEDED 30 tablet 0   ALPRAZolam (XANAX) 1 MG tablet TAKE 1 TABLET BY MOUTH 3 TIMES A DAY AS NEEDED FOR ANXIETY,PANIC ATTACKS OR AGITATION 270 tablet 1   atorvastatin (LIPITOR) 40 MG tablet TAKE 1 TABLET BY MOUTH EVERY DAY 90 tablet 3   gabapentin (NEURONTIN) 300 MG capsule TAKE 1 TO 3 CAPSULES BY MOUTH 2-3 HOURS BEFORE BEDTIME *KEEP APPT* 60 capsule 0   lamoTRIgine (LAMICTAL) 150 MG tablet Take 2 tablets (300 mg total) by mouth at bedtime. 180 tablet 1   lidocaine (XYLOCAINE) 2 % solution Use as directed 15  mLs in the mouth or throat as needed for mouth pain. 100 mL 0   lubiprostone (AMITIZA) 24 MCG capsule TAKE 1 CAPSULE BY MOUTH TWICE A DAY WITH A MEAL (Patient not taking: Reported on 09/23/2021) 60 capsule 3   ondansetron (ZOFRAN) 4 MG tablet TAKE 1 TABLET BY MOUTH TWICE A DAY AS NEEDED FOR NAUSEA/VOMITING (Patient not taking: Reported on 09/23/2021) 15 tablet 0   QUEtiapine (SEROQUEL) 200 MG tablet Take 1 tablet (200 mg total) by mouth at bedtime. 90 tablet 1   [START ON 06/24/2022] zolpidem (AMBIEN) 10 MG tablet  TAKE 1 TABLET BY MOUTH EVERYDAY AT BEDTIME 90 tablet 0   No current facility-administered medications for this visit.    Medication Side Effects: Other: Tremors  Allergies:  Allergies  Allergen Reactions   Topamax [Topiramate]     Sleep walking, hallucinations, "I get mean, can't help it"    Past Medical History:  Diagnosis Date   ALLERGIC RHINITIS 05/10/2009   Allergy    anemia   Anxiety    Bipolar disorder ([place])    see medications patient to bring   Chronic headaches    DDD (degenerative disc disease)    DEPRESSION 05/10/2009   Gastroparesis    GERD 05/10/2009   LIBIDO, DECREASED 05/10/2009   Neuromuscular disorder ([place])    pt. denies 08/01/16   Osgood-Schlatter's disease    SLEEP APNEA, OBSTRUCTIVE 06/12/2009   CPAP not utilized after 30 lb. weight loss   SMOKER 06/12/2009   TESTICULAR HYPOFUNCTION 06/12/2009   WEIGHT GAIN 05/10/2009    Past Medical History, Surgical history, Social history, and Family history were reviewed and updated as appropriate.   Please see review of systems for further details on the patient's review from today.   Objective:   Physical Exam:  There were no vitals taken for this visit.  Physical Exam Constitutional:      General: Sean Wu is not in acute distress. Musculoskeletal:        General: No deformity.  Neurological:     Mental Status: Sean Wu is alert and oriented to person, place, and time.     Coordination: Coordination normal.  Psychiatric:        Attention and Perception: Attention and perception normal. Sean Wu does not perceive auditory or visual hallucinations.        Mood and Affect: Mood is anxious. Mood is not depressed. Affect is not labile, blunt, angry or inappropriate.        Speech: Speech normal.        Behavior: Behavior normal.        Thought Content: Thought content normal. Thought content is not paranoid or delusional. Thought content does not include homicidal or suicidal ideation. Thought content does not include homicidal or  suicidal plan.        Cognition and Memory: Cognition and memory normal.        Judgment: Judgment normal.     Comments: Insight intact     Lab Review:     Component Value Date/Time   NA 135 08/28/2021 1356   K 3.9 08/28/2021 1356   CL 102 08/28/2021 1356   CO2 26 08/28/2021 1356   GLUCOSE 129 (H) 08/28/2021 1356   BUN 15 08/28/2021 1356   CREATININE 0.73 08/28/2021 1356   CREATININE 0.94 04/25/2020 0851   CALCIUM 9.0 08/28/2021 1356   PROT 7.0 08/28/2021 1356   ALBUMIN 3.7 08/28/2021 1356   AST 9 (L) 08/28/2021 1356   ALT 8 08/28/2021 1356  ALKPHOS 79 08/28/2021 1356   BILITOT 0.4 08/28/2021 1356   GFRNONAA >60 08/28/2021 1356   GFRNONAA 93 04/25/2020 0851   GFRAA 108 04/25/2020 0851       Component Value Date/Time   WBC 8.0 08/28/2021 1356   RBC 5.35 08/28/2021 1356   HGB 15.5 08/28/2021 1356   HCT 45.9 08/28/2021 1356   PLT 340 08/28/2021 1356   MCV 85.8 08/28/2021 1356   MCH 29.0 08/28/2021 1356   MCHC 33.8 08/28/2021 1356   RDW 14.0 08/28/2021 1356   LYMPHSABS 1.9 08/28/2021 1356   MONOABS 0.6 08/28/2021 1356   EOSABS 0.3 08/28/2021 1356   BASOSABS 0.1 08/28/2021 1356    No results found for: "POCLITH", "LITHIUM"   No results found for: "PHENYTOIN", "PHENOBARB", "VALPROATE", "CBMZ"   .res Assessment: Plan:    Will change Seroquel XR 300 mg to Seroquel IR 200 mg po QHS since Sean Wu reports that this was more effective for sleep initiation in the past.  Discussed that Metoclopramide can cause movement side effects and that this risk is higher when combined with antipsychotics. Recommended that Sean Wu notify his medical provider if Sean Wu experiences any involuntary movements with Metoclopramide. Sean Wu reports that Sean Wu has been experiencing possible akathisia and will contact GI specialist about this.  Will continue Ambien 10 mg po QHS for insomnia.  Continue Xanax 1 mg po TID prn anxiety.  Pt to follow-up in 6 months or sooner if clinically indicated.  Patient advised  to contact office with any questions, adverse effects, or acute worsening in signs and symptoms.   Sean Wu was seen today for sleeping problem, anxiety and follow-up.  Diagnoses and all orders for this visit:  Bipolar I disorder, mild, current or most recent episode depressed, with mixed features (HCC) -     zolpidem (AMBIEN) 10 MG tablet; TAKE 1 TABLET BY MOUTH EVERYDAY AT BEDTIME -     ALPRAZolam (XANAX) 1 MG tablet; TAKE 1 TABLET BY MOUTH 3 TIMES A DAY AS NEEDED FOR ANXIETY,PANIC ATTACKS OR AGITATION -     lamoTRIgine (LAMICTAL) 150 MG tablet; Take 2 tablets (300 mg total) by mouth at bedtime. -     QUEtiapine (SEROQUEL) 200 MG tablet; Take 1 tablet (200 mg total) by mouth at bedtime.  Social anxiety disorder -     zolpidem (AMBIEN) 10 MG tablet; TAKE 1 TABLET BY MOUTH EVERYDAY AT BEDTIME -     ALPRAZolam (XANAX) 1 MG tablet; TAKE 1 TABLET BY MOUTH 3 TIMES A DAY AS NEEDED FOR ANXIETY,PANIC ATTACKS OR AGITATION -     QUEtiapine (SEROQUEL) 200 MG tablet; Take 1 tablet (200 mg total) by mouth at bedtime.  Somatic symptom disorder -     zolpidem (AMBIEN) 10 MG tablet; TAKE 1 TABLET BY MOUTH EVERYDAY AT BEDTIME -     lamoTRIgine (LAMICTAL) 150 MG tablet; Take 2 tablets (300 mg total) by mouth at bedtime.  OSA on CPAP -     zolpidem (AMBIEN) 10 MG tablet; TAKE 1 TABLET BY MOUTH EVERYDAY AT BEDTIME  Panic disorder -     ALPRAZolam (XANAX) 1 MG tablet; TAKE 1 TABLET BY MOUTH 3 TIMES A DAY AS NEEDED FOR ANXIETY,PANIC ATTACKS OR AGITATION -     QUEtiapine (SEROQUEL) 200 MG tablet; Take 1 tablet (200 mg total) by mouth at bedtime.     Please see After Visit Summary for patient specific instructions.  No future appointments.  No orders of the defined types were placed in this encounter.   -------------------------------

## 2022-04-07 NOTE — Telephone Encounter (Signed)
Has appt with Sean Wu today

## 2022-04-25 ENCOUNTER — Other Ambulatory Visit: Payer: Self-pay | Admitting: Family Medicine

## 2022-04-25 DIAGNOSIS — R110 Nausea: Secondary | ICD-10-CM

## 2022-07-04 ENCOUNTER — Other Ambulatory Visit: Payer: Self-pay | Admitting: Psychiatry

## 2022-07-04 DIAGNOSIS — F4010 Social phobia, unspecified: Secondary | ICD-10-CM

## 2022-07-04 DIAGNOSIS — F410 Panic disorder [episodic paroxysmal anxiety] without agoraphobia: Secondary | ICD-10-CM

## 2022-07-04 DIAGNOSIS — F3131 Bipolar disorder, current episode depressed, mild: Secondary | ICD-10-CM

## 2022-08-30 ENCOUNTER — Other Ambulatory Visit: Payer: Self-pay | Admitting: Psychiatry

## 2022-08-30 DIAGNOSIS — F4010 Social phobia, unspecified: Secondary | ICD-10-CM

## 2022-08-30 DIAGNOSIS — F410 Panic disorder [episodic paroxysmal anxiety] without agoraphobia: Secondary | ICD-10-CM

## 2022-08-30 DIAGNOSIS — F3131 Bipolar disorder, current episode depressed, mild: Secondary | ICD-10-CM

## 2022-09-28 ENCOUNTER — Other Ambulatory Visit: Payer: Self-pay | Admitting: Psychiatry

## 2022-09-28 DIAGNOSIS — F3131 Bipolar disorder, current episode depressed, mild: Secondary | ICD-10-CM

## 2022-09-28 DIAGNOSIS — F410 Panic disorder [episodic paroxysmal anxiety] without agoraphobia: Secondary | ICD-10-CM

## 2022-09-28 DIAGNOSIS — F4010 Social phobia, unspecified: Secondary | ICD-10-CM

## 2022-09-30 ENCOUNTER — Other Ambulatory Visit: Payer: Self-pay | Admitting: Psychiatry

## 2022-09-30 DIAGNOSIS — F4010 Social phobia, unspecified: Secondary | ICD-10-CM

## 2022-09-30 DIAGNOSIS — F410 Panic disorder [episodic paroxysmal anxiety] without agoraphobia: Secondary | ICD-10-CM

## 2022-09-30 DIAGNOSIS — F3131 Bipolar disorder, current episode depressed, mild: Secondary | ICD-10-CM

## 2022-10-01 ENCOUNTER — Other Ambulatory Visit: Payer: Self-pay | Admitting: Psychiatry

## 2022-10-01 DIAGNOSIS — G4733 Obstructive sleep apnea (adult) (pediatric): Secondary | ICD-10-CM

## 2022-10-01 DIAGNOSIS — F451 Undifferentiated somatoform disorder: Secondary | ICD-10-CM

## 2022-10-01 DIAGNOSIS — F3131 Bipolar disorder, current episode depressed, mild: Secondary | ICD-10-CM

## 2022-10-01 DIAGNOSIS — F4010 Social phobia, unspecified: Secondary | ICD-10-CM

## 2022-10-01 NOTE — Telephone Encounter (Signed)
Filled 11/3 f/u due next month

## 2022-10-01 NOTE — Telephone Encounter (Signed)
Please call to schedule an appt, due this month.  

## 2022-10-02 NOTE — Telephone Encounter (Signed)
Lvm for pt to call and schedule

## 2022-11-03 ENCOUNTER — Other Ambulatory Visit: Payer: Self-pay | Admitting: Psychiatry

## 2022-11-03 DIAGNOSIS — G4733 Obstructive sleep apnea (adult) (pediatric): Secondary | ICD-10-CM

## 2022-11-03 DIAGNOSIS — F4010 Social phobia, unspecified: Secondary | ICD-10-CM

## 2022-11-03 DIAGNOSIS — F410 Panic disorder [episodic paroxysmal anxiety] without agoraphobia: Secondary | ICD-10-CM

## 2022-11-03 DIAGNOSIS — F3131 Bipolar disorder, current episode depressed, mild: Secondary | ICD-10-CM

## 2022-11-03 DIAGNOSIS — F451 Undifferentiated somatoform disorder: Secondary | ICD-10-CM

## 2022-11-07 ENCOUNTER — Ambulatory Visit: Payer: BC Managed Care – PPO | Admitting: Psychiatry

## 2022-11-27 ENCOUNTER — Ambulatory Visit: Payer: BC Managed Care – PPO | Admitting: Psychiatry

## 2022-11-27 ENCOUNTER — Encounter: Payer: Self-pay | Admitting: Psychiatry

## 2022-11-27 DIAGNOSIS — F451 Undifferentiated somatoform disorder: Secondary | ICD-10-CM | POA: Diagnosis not present

## 2022-11-27 DIAGNOSIS — G4733 Obstructive sleep apnea (adult) (pediatric): Secondary | ICD-10-CM

## 2022-11-27 DIAGNOSIS — F3131 Bipolar disorder, current episode depressed, mild: Secondary | ICD-10-CM

## 2022-11-27 DIAGNOSIS — F4010 Social phobia, unspecified: Secondary | ICD-10-CM

## 2022-11-27 MED ORDER — ZOLPIDEM TARTRATE 10 MG PO TABS: ORAL_TABLET | ORAL | 5 refills | Status: DC

## 2022-11-27 MED ORDER — LAMOTRIGINE 150 MG PO TABS: 300.0000 mg | ORAL_TABLET | Freq: Every day | ORAL | 1 refills | Status: DC

## 2022-11-27 MED ORDER — QUETIAPINE FUMARATE 300 MG PO TABS: 300.0000 mg | ORAL_TABLET | Freq: Every day | ORAL | 1 refills | Status: DC

## 2022-11-27 NOTE — Progress Notes (Signed)
AVRON MAYO [id number] [birth year] 52 y.o.  Subjective:   Patient ID:  Sean Wu is a 52 y.o. (DOB 1970/11/07) male.  Chief Complaint:  Chief Complaint  Patient presents with   Depression   Anxiety    HPI JSHON MCCLENNEY presents to the office today for follow-up of Bipolar disorder, anxiety, and insomnia.   He reports that he has been "feeling a little depressed" for the last couple of weeks. He reports that his mother continues to have cognitive decline and has been refusing to speak with him. Energy and motivaton "have acutally gotten better" with returning to the gym. He reports that he is also expeirencing increased anxiety. He reports that he notices "constant" anxiety and worry. He reports that he has had some panic symptoms. He reports that sleep varies with a few nights a week of poor sleep and other nights his sleep is adequate. He reports poor sleep last night and having vivid dream of deceased loved one. Appetite has been "good."He reports poor concnetration and he reports that this is consistent with his baseline. He reports that his wife will remind him to stay on task. He reports that he also has periods of hyper-focus. He notices some possible manic s/s.- "hyper." Denies impulsive or risky behavior. Denies elevated moods. He reports some irritability at baseline. Denies SI.  He reports that he continues to have some involuntary movements and this has been long-standing.   He reports that he does not take Ambien prn every night.   He reports that he is using Alprazolam prn- "it's not everyday, but there are days that I may need more than one a day."   Ambien last filled 11/04/22. Alprazolam last filled 10/02/22 filled for 90 days.   Past Psychiatric Medication Trials: Seroquel- Helpful most nights for sleep.  Saphris Rexulti Ambien- Uses prn. Reports that it works 8 out 10 nights Belsomra-Ineffective Topamax-Adverse reaction Lamictal- Helpful for  mood Gabapentin Depakote- Wt gain Cymbalta Paxil Xanax -helpful for anxiety Clonidine Librium Trazodone  [place] Office Visit from 11/27/2022 in [facility] Office Visit from 04/07/2022 in [facility] Office Visit from 09/23/2021 in [place] Office Visit from 11/23/2020 in [place] Total Score 11 1 1 1       [place] Office Visit from 01/16/2015 in [facility]  Total Score (max 30 points ) 29      PHQ2-9    [place] Visit from 02/22/2016 in [facility] [facility]  PHQ-2 Total Score 1      [place] ED from 08/28/2021 in [facility] at [place] No Risk        Review of Systems:  Review of Systems  HENT:  Positive for dental problem and hearing loss.   Musculoskeletal:  Negative for gait problem.  Neurological:  Positive for tremors.  Psychiatric/Behavioral:         Please refer to HPI    Medications: I have reviewed the patient's current medications.  Current Outpatient Medications  Medication Sig Dispense Refill   ALPRAZolam (XANAX) 1 MG tablet TAKE 1 TABLET BY MOUTH 3 TIMES A DAY AS NEEDED FOR ANXIETY,PANIC ATTACKS OR AGITATION 270 tablet 0   atorvastatin (LIPITOR) 40 MG tablet TAKE 1 TABLET BY MOUTH EVERY DAY 90 tablet 3   linaCLOtide (LINZESS  PO) Take by mouth daily as needed.     metoCLOPramide (REGLAN) 5 MG tablet TAKE 1 TABLET BY MOUTH 3 TIMES DAILY BEFORE MEALS. 270 tablet 1   prochlorperazine (COMPAZINE) 10 MG tablet TAKE 1 TABLET BY MOUTH EVERY DAY AS NEEDED 30 tablet 0   QUEtiapine (SEROQUEL) 300 MG tablet Take 1 tablet (300 mg total) by mouth at bedtime. 90 tablet 1   gabapentin (NEURONTIN) 300 MG capsule TAKE 1 TO 3 CAPSULES BY MOUTH 2-3 HOURS BEFORE BEDTIME *KEEP APPT* (Patient not taking:  Reported on 11/27/2022) 60 capsule 0   lamoTRIgine (LAMICTAL) 150 MG tablet Take 2 tablets (300 mg total) by mouth at bedtime. 180 tablet 1   [START ON 12/02/2022] zolpidem (AMBIEN) 10 MG tablet TAKE 1 TABLET BY MOUTH EVERYDAY AT BEDTIME 30 tablet 5   No current facility-administered medications for this visit.    Medication Side Effects: Other: Involuntary movements  Allergies:  Allergies  Allergen Reactions   Topamax [Topiramate]     Sleep walking, hallucinations, "I get mean, can't help it"    Past Medical History:  Diagnosis Date   ALLERGIC RHINITIS 05/10/2009   Allergy    anemia   Anxiety    Bipolar disorder ([place])    see medications patient to bring   Chronic headaches    DDD (degenerative disc disease)    DEPRESSION 05/10/2009   Gastroparesis    GERD 05/10/2009   LIBIDO, DECREASED 05/10/2009   Neuromuscular disorder ([place])    pt. denies 08/01/16   Osgood-Schlatter's disease    SLEEP APNEA, OBSTRUCTIVE 06/12/2009   CPAP not utilized after 30 lb. weight loss   SMOKER 06/12/2009   TESTICULAR HYPOFUNCTION 06/12/2009   WEIGHT GAIN 05/10/2009    Past Medical History, Surgical history, Social history, and Family history were reviewed and updated as appropriate.   Please see review of systems for further details on the patient's review from today.   Objective:   Physical Exam:  There were no vitals taken for this visit.  Physical Exam Constitutional:      General: He is not in acute distress. Musculoskeletal:        General: No deformity.  Neurological:     Mental Status: He is alert and oriented to person, place, and time.     Coordination: Coordination normal.  Psychiatric:        Attention and Perception: Attention and perception normal. He does not perceive auditory or visual hallucinations.        Mood and Affect: Mood is anxious and depressed. Affect is not labile, blunt, angry or inappropriate.        Speech: Speech normal.        Behavior: Behavior normal.         Thought Content: Thought content normal. Thought content is not paranoid or delusional. Thought content does not include homicidal or suicidal ideation. Thought content does not include homicidal or suicidal plan.        Cognition and Memory: Cognition and memory normal.        Judgment: Judgment normal.     Comments: Insight intact     Lab Review:     Component Value Date/Time   NA 135 08/28/2021 1356   K 3.9 08/28/2021 1356   CL 102 08/28/2021 1356   CO2 26 08/28/2021 1356   GLUCOSE 129 (H) 08/28/2021 1356   BUN 15 08/28/2021 1356   CREATININE 0.73 08/28/2021 1356   CREATININE 0.94 04/25/2020 0851   CALCIUM 9.0 08/28/2021  1356   PROT 7.0 08/28/2021 1356   ALBUMIN 3.7 08/28/2021 1356   AST 9 (L) 08/28/2021 1356   ALT 8 08/28/2021 1356   ALKPHOS 79 08/28/2021 1356   BILITOT 0.4 08/28/2021 1356   GFRNONAA >60 08/28/2021 1356   GFRNONAA 93 04/25/2020 0851   GFRAA 108 04/25/2020 0851       Component Value Date/Time   WBC 8.0 08/28/2021 1356   RBC 5.35 08/28/2021 1356   HGB 15.5 08/28/2021 1356   HCT 45.9 08/28/2021 1356   PLT 340 08/28/2021 1356   MCV 85.8 08/28/2021 1356   MCH 29.0 08/28/2021 1356   MCHC 33.8 08/28/2021 1356   RDW 14.0 08/28/2021 1356   LYMPHSABS 1.9 08/28/2021 1356   MONOABS 0.6 08/28/2021 1356   EOSABS 0.3 08/28/2021 1356   BASOSABS 0.1 08/28/2021 1356    No results found for: "POCLITH", "LITHIUM"   No results found for: "PHENYTOIN", "PHENOBARB", "VALPROATE", "CBMZ"   .res Assessment: Plan:    Pt seen for 30 minutes and time spent discussing treatment options for depression and also discussing TD. Discussed potential benefits, risk, and side effects of increasing Seroquel to 300 mg po QHS to improve mood symptoms. Pt agrees to increase in Seroquel and reports that benefits of Seroquel are outweighing side effects and potential risks at this time.  Continue Alprazolam 1 mg TID prn anxiety.  Continue Lamictal 300 mg po qd for mood symptoms.   Continue Ambien 10 mg po QHS for insomnia.  Pt to follow-up in 3 months or sooner if clinically indicated.  Patient advised to contact office with any questions, adverse effects, or acute worsening in signs and symptoms.    Kdyn was seen today for depression and anxiety.  Diagnoses and all orders for this visit:  Social anxiety disorder -     zolpidem (AMBIEN) 10 MG tablet; TAKE 1 TABLET BY MOUTH EVERYDAY AT BEDTIME  OSA on CPAP -     zolpidem (AMBIEN) 10 MG tablet; TAKE 1 TABLET BY MOUTH EVERYDAY AT BEDTIME  Somatic symptom disorder -     zolpidem (AMBIEN) 10 MG tablet; TAKE 1 TABLET BY MOUTH EVERYDAY AT BEDTIME -     lamoTRIgine (LAMICTAL) 150 MG tablet; Take 2 tablets (300 mg total) by mouth at bedtime.  Bipolar I disorder, mild, current or most recent episode depressed, with mixed features (HCC) -     zolpidem (AMBIEN) 10 MG tablet; TAKE 1 TABLET BY MOUTH EVERYDAY AT BEDTIME -     lamoTRIgine (LAMICTAL) 150 MG tablet; Take 2 tablets (300 mg total) by mouth at bedtime.  Other orders -     QUEtiapine (SEROQUEL) 300 MG tablet; Take 1 tablet (300 mg total) by mouth at bedtime.     Please see After Visit Summary for patient specific instructions.  Future Appointments  Date Time Provider [place]  12/31/2022  9:30 AM Willia Craze, NP [facility]  02/27/2023  9:00 AM Thayer Headings, PMHNP [facility] None    No orders of the defined types were placed in this encounter.   -------------------------------

## 2022-11-28 DIAGNOSIS — H6123 Impacted cerumen, bilateral: Secondary | ICD-10-CM | POA: Diagnosis not present

## 2022-11-28 DIAGNOSIS — Z6827 Body mass index (BMI) 27.0-27.9, adult: Secondary | ICD-10-CM | POA: Diagnosis not present

## 2022-12-17 ENCOUNTER — Other Ambulatory Visit: Payer: Self-pay | Admitting: Psychiatry

## 2022-12-17 DIAGNOSIS — F410 Panic disorder [episodic paroxysmal anxiety] without agoraphobia: Secondary | ICD-10-CM

## 2022-12-17 DIAGNOSIS — F3131 Bipolar disorder, current episode depressed, mild: Secondary | ICD-10-CM

## 2022-12-17 DIAGNOSIS — F4010 Social phobia, unspecified: Secondary | ICD-10-CM

## 2022-12-22 ENCOUNTER — Telehealth: Payer: Self-pay | Admitting: Psychiatry

## 2022-12-22 DIAGNOSIS — F410 Panic disorder [episodic paroxysmal anxiety] without agoraphobia: Secondary | ICD-10-CM

## 2022-12-22 DIAGNOSIS — F3131 Bipolar disorder, current episode depressed, mild: Secondary | ICD-10-CM

## 2022-12-22 DIAGNOSIS — F4010 Social phobia, unspecified: Secondary | ICD-10-CM

## 2022-12-22 MED ORDER — ALPRAZOLAM 1 MG PO TABS: ORAL_TABLET | ORAL | 1 refills | Status: DC

## 2022-12-22 MED ORDER — QUETIAPINE FUMARATE 300 MG PO TABS: 300.0000 mg | ORAL_TABLET | Freq: Every day | ORAL | 1 refills | Status: DC

## 2022-12-22 NOTE — Telephone Encounter (Signed)
Scripts have been sent

## 2022-12-22 NOTE — Telephone Encounter (Signed)
Pt LVM 4:20 6:23 PM stating he can't get RF on Alprazolam and Seroquel. Needs his meds

## 2022-12-31 ENCOUNTER — Other Ambulatory Visit (INDEPENDENT_AMBULATORY_CARE_PROVIDER_SITE_OTHER): Payer: BC Managed Care – PPO

## 2022-12-31 ENCOUNTER — Ambulatory Visit: Payer: BC Managed Care – PPO | Admitting: Nurse Practitioner

## 2022-12-31 ENCOUNTER — Encounter: Payer: Self-pay | Admitting: Nurse Practitioner

## 2022-12-31 VITALS — BP 130/80 | HR 88 | Ht 68.0 in | Wt 217.0 lb

## 2022-12-31 DIAGNOSIS — K5909 Other constipation: Secondary | ICD-10-CM

## 2022-12-31 DIAGNOSIS — Z1211 Encounter for screening for malignant neoplasm of colon: Secondary | ICD-10-CM

## 2022-12-31 DIAGNOSIS — K219 Gastro-esophageal reflux disease without esophagitis: Secondary | ICD-10-CM

## 2022-12-31 DIAGNOSIS — R635 Abnormal weight gain: Secondary | ICD-10-CM

## 2022-12-31 LAB — TSH: TSH: 1.08 u[IU]/mL (ref 0.35–5.50)

## 2022-12-31 MED ORDER — NA SULFATE-K SULFATE-MG SULF 17.5-3.13-1.6 GM/177ML PO SOLN: 1.0000 | ORAL | 0 refills | Status: DC

## 2022-12-31 MED ORDER — LINACLOTIDE 290 MCG PO CAPS: 290.0000 ug | ORAL_CAPSULE | Freq: Every day | ORAL | 5 refills | Status: DC

## 2022-12-31 MED ORDER — OMEPRAZOLE 20 MG PO CPDR: 20.0000 mg | DELAYED_RELEASE_CAPSULE | Freq: Every day | ORAL | 5 refills | Status: DC

## 2022-12-31 NOTE — Progress Notes (Signed)
.   Assessment and Plan   Primary Gi: Wendall Papa, MD      Brief Narrative:  52 y.o. yo male with a past medical history of, but not limited to bipolar disorder , depression, chronic constipation, gastroparesis, DDD, sleep apnea, mild diverticulosis   Chronic GERD. Having more frequent heartburn lately, ? Possibly due to recent weight gain.  Prilosec OTC helps, he takes it several  times a week -Recommend an increase in Prilosec to 20 mg daily 30 minutes prior to breakfast.  -Discussed anti-reflux measures such as avoidance of late meals / bedtime snacks, HOB elevation (or use of wedge pillow), weight reduction ( if applicable)  / maintaining a healthy BMI ( body mass index),  and avoidance of trigger foods and caffeine.   Delayed gastric emptying.  Abnormal two hour gastric emptying study in 2014.   He takes Reglan 3 times daily.  No reported side effects  Solid food dysphagia. Possibly secondary to an esophageal stricture or dysmotility -Schedule for EGD. The risks and benefits of EGD with possible biopsies were discussed with the patient who agrees to proceed.  -Swallowing precautions discussed. Advised to eat slowly, chew food well before swallowing. Drink  liquids in between each bite to avoid food impaction.  Weight gain. Reports a recent 30 pound gain.  -He has hypothyroidism but stopped thyroid medication on his own over a year ago . Check TSH  Chronic constipation. Not well-controlled but taking Linzess 290 mcg only as needed -Increase Linzess to once daily. Will refill 290 mcg daily on empty stomach # 30, 5 refills. Advised him to stick with the medication as the initial loose BMs often resolve. It need be we can switch to Amitiza -Increase water intake to 60 oz daily -We are not starting fiber supplement due to gastroparesis.   Colon cancer screening. Overdue for 10 year screening. -Schedule for a screening colonoscopy. The risks and benefits of colonoscopy with possible  polypectomy / biopsies were discussed and the patient agrees to proceed. Two day bowel prep.   History of Present Illness   Chief complaint:  swallowing problems  Having solid food dysphagia for one year. Doesn't occurs with any particular food. Generally has to heave the food from esophagus, it will not pass with fluids. Episodes occur several times a week. No weight loss. He reports a recent unexplained weight gain of 30 pounds No edema / SHOB.  Also, having frequent heartburn, Prilosec OTC helps but he doesn't take it everyday.  He consumes a lot of caffeine [facility] Dew drinks).   Brendia Sacks has chronic constipation.  Does not take Linzess every day since it causes loose stool.   Previous GI Endoscopies / Labs / Imaging   Gastric emptying study 2014 Findings: At 120 minutes, 44% activity remains in the stomach.  Normal gastric retention is less than 30% at 2 hours.   Colonoscopy March 2014 -A few small diverticulum in the left colon.  Exam otherwise normal  EGD December 2017 for GERD and dysphagia -LA grade a esophagitis in the distal esophagus.  Mild inflammation characterized by erythema and friability found in the antrum of the stomach.      Latest Ref Rng & Units 08/28/2021    1:56 PM 04/25/2020    8:51 AM 03/31/2017   12:04 PM  Hepatic Function  Total Protein 6.5 - 8.1 g/dL 7.0  6.8  7.0   Albumin 3.5 - 5.0 g/dL 3.7   4.0   AST 15 - 41  U/L 9  21  10    ALT 0 - 44 U/L 8  21  9    Alk Phosphatase 38 - 126 U/L 79   81   Total Bilirubin 0.3 - 1.2 mg/dL 0.4  0.4  0.4        Latest Ref Rng & Units 08/28/2021    1:56 PM 03/31/2017   12:04 PM 02/22/2016    9:32 AM  CBC  WBC 4.0 - 10.5 K/uL 8.0  8.4  6.2   Hemoglobin 13.0 - 17.0 g/dL 40.9  81.1  91.4   Hematocrit 39.0 - 52.0 % 45.9  48.0  44.5   Platelets 150 - 400 K/uL 340  306.0  283.0      Past Medical History:  Diagnosis Date   ALLERGIC RHINITIS 05/10/2009   Allergy    anemia   Anxiety    Bipolar disorder (HCC)     see medications patient to bring   Chronic headaches    DDD (degenerative disc disease)    DEPRESSION 05/10/2009   Gastroparesis    GERD 05/10/2009   LIBIDO, DECREASED 05/10/2009   Neuromuscular disorder (HCC)    pt. denies 08/01/16   Osgood-Schlatter's disease    SLEEP APNEA, OBSTRUCTIVE 06/12/2009   CPAP not utilized after 30 lb. weight loss   SMOKER 06/12/2009   TESTICULAR HYPOFUNCTION 06/12/2009   WEIGHT GAIN 05/10/2009    Past Surgical History:  Procedure Laterality Date   ANKLE SURGERY Left    COLONOSCOPY  2014   normal   EYE SURGERY     eye implant   FRACTURE SURGERY     surgery on right ankle for ligament torn 2013   LASIK Bilateral    SHOULDER ARTHROSCOPY WITH DISTAL CLAVICLE RESECTION Right 02/26/2015   Procedure: SHOULDER DIAGNOSTIC ARTHROSCOPY WITH OPEN DISTAL CLAVICLE EXCISION;  Surgeon: Jones Broom, MD;  Location: [facility];  Service: Orthopedics;  Laterality: Right;  Right diagnostic arthroscopy, open distal clavical excision    Current Medications, Allergies, Family History and Social History were reviewed in [employer] record.     Current Outpatient Medications  Medication Sig Dispense Refill   ALPRAZolam (XANAX) 1 MG tablet TAKE 1 TABLET BY MOUTH 3 TIMES A DAY AS NEEDED FOR ANXIETY,PANIC ATTACKS OR AGITATION 270 tablet 1   atorvastatin (LIPITOR) 40 MG tablet TAKE 1 TABLET BY MOUTH EVERY DAY 90 tablet 3   gabapentin (NEURONTIN) 300 MG capsule TAKE 1 TO 3 CAPSULES BY MOUTH 2-3 HOURS BEFORE BEDTIME *KEEP APPT* 60 capsule 0   lamoTRIgine (LAMICTAL) 150 MG tablet Take 2 tablets (300 mg total) by mouth at bedtime. 180 tablet 1   linaCLOtide (LINZESS PO) Take by mouth daily as needed.     metoCLOPramide (REGLAN) 5 MG tablet TAKE 1 TABLET BY MOUTH 3 TIMES DAILY BEFORE MEALS. 270 tablet 1   prochlorperazine (COMPAZINE) 10 MG tablet TAKE 1 TABLET BY MOUTH EVERY DAY AS NEEDED 30 tablet 0   QUEtiapine (SEROQUEL) 300 MG tablet Take  1 tablet (300 mg total) by mouth at bedtime. 90 tablet 1   zolpidem (AMBIEN) 10 MG tablet TAKE 1 TABLET BY MOUTH EVERYDAY AT BEDTIME 30 tablet 5   No current facility-administered medications for this visit.    Review of Systems: No chest pain. No shortness of breath. No urinary complaints.    Physical Exam  Wt Readings from Last 3 Encounters:  12/31/22 217 lb (98.4 kg)  08/28/21 190 lb (86.2 kg)  04/25/20 217 lb (98.4  kg)    BP 130/80   Pulse 88   Ht 5\' 8"  (1.727 m)   Wt 217 lb (98.4 kg)   BMI 32.99 kg/m  Constitutional:  Pleasant, obese male in no acute distress. Psychiatric: Normal mood and affect. Behavior is normal. EENT: Pupils normal.  Conjunctivae are normal. No scleral icterus. Neck supple.  Cardiovascular: Normal rate, regular rhythm.  Pulmonary/chest: Effort normal and breath sounds normal. No wheezing, rales or rhonchi. Abdominal: Soft, nondistended, nontender. Bowel sounds active throughout. There are no masses palpable. No hepatomegaly. Neurological: Alert and oriented to person place and time.  Extremities: no edema Skin: Skin is warm and dry. No rashes noted.  Willette Cluster, NP  12/31/2022, 9:31 AM  Cc:  [place], Betty G, MD

## 2022-12-31 NOTE — Patient Instructions (Addendum)
_______________________________________________________  If your blood pressure at your visit was 140/90 or greater, please contact your primary care physician to follow up on this.  If you are age 54 or younger, your body mass index should be between 19-25. Your Body mass index is 32.99 kg/m. If this is out of the aformentioned range listed, please consider follow up with your Primary Care Provider.  ________________________________________________________  The [facility] providers would like to encourage you to use [facility] to communicate with providers for non-urgent requests or questions.  Due to long hold times on the telephone, sending your provider a message by [facility] may be a faster and more efficient way to get a response.  Please allow 48 business hours for a response.  Please remember that this is for non-urgent requests.  _______________________________________________________  We have sent the following medications to your pharmacy for you to pick up at your convenience:  Linzess take one capsule daily before breakfast Omeprazole 20mg  take one capsule daily before breakfast.  Linzess works best when taken once a day every day, on an empty stomach, at least 30 minutes before your first meal of the day.  When Linzess is taken daily as directed:  *Constipation relief is typically felt in about a week *IBS-C patients may begin to experience relief from belly pain and overall abdominal symptoms (pain, discomfort, and bloating) in about 1 week,   with symptoms typically improving over 12 weeks.  Diarrhea may occur in the first 2 weeks -keep taking it.  The diarrhea should go away and you should start having normal, complete, full bowel movements. It may be helpful to start treatment when you can be near the comfort of your own bathroom, such as a weekend.   Your provider has requested that you go to the basement level for lab work before leaving today. Press "B" on the  elevator. The lab is located at the first door on the left as you exit the elevator.  You have been scheduled for an endoscopy and colonoscopy. Please follow the written instructions given to you at your visit today. Please pick up your prep supplies at the pharmacy within the next 1-3 days. If you use inhalers (even only as needed), please bring them with you on the day of your procedure.  Due to recent changes in healthcare laws, you may see the results of your imaging and laboratory studies on MyChart before your provider has had a chance to review them.  We understand that in some cases there may be results that are confusing or concerning to you. Not all laboratory results come back in the same time frame and the provider may be waiting for multiple results in order to interpret others.  Please give [place] 48 hours in order for your provider to thoroughly review all the results before contacting the office for clarification of your results.   Acid Reflux  Below are some measures you can take to possibly improve acid reflux symptoms . We may have discussed some of these today in the office. Not everything on this list may apply to you   --If you are taking anti-reflux ( GERD) medication be sure to take it 30 minutes before breakfast and if taking twice daily then also second dose should be 30 minutes before dinner.   --Avoid late meals / bedtime snacks.   --Avoid trigger foods ( foods which you know tend to aggravate you reflux symptoms). Some common trigger foods include spicy foods, fatty foods, acidic  foods, chocolate and caffeine.  --Elevate the head of bed 6-8 inches on blocks or bricks. If not able to elevate the head of the bed consider purchasing a wedge pillow to sleep on.    --Weight reduction / maintain a healthy BMI ( body mass index) may be help with reflux symptoms  --Sometimes with the above mentioned "lifestyle changes" patients are able to reduce the amount of GERD medications they  take. Our goal is to have you on the lowest effective dose of medication  Swallowing problems:  Until we can better understand what is going on with your swallowing be sure to take swallowing precautions. Eat slowly, chew food well before swallowing. Drink  liquids in between each bite to avoid food impaction.  Thank you for entrusting me with your care and choosing [facility].  Clover Mealy, NP

## 2023-01-02 ENCOUNTER — Other Ambulatory Visit: Payer: Self-pay | Admitting: Psychiatry

## 2023-01-02 DIAGNOSIS — F4010 Social phobia, unspecified: Secondary | ICD-10-CM

## 2023-01-02 DIAGNOSIS — F410 Panic disorder [episodic paroxysmal anxiety] without agoraphobia: Secondary | ICD-10-CM

## 2023-01-02 DIAGNOSIS — F3131 Bipolar disorder, current episode depressed, mild: Secondary | ICD-10-CM

## 2023-01-05 ENCOUNTER — Telehealth: Payer: Self-pay

## 2023-01-05 NOTE — Telephone Encounter (Signed)
Spoke to patient. Pt aware of reults

## 2023-01-09 NOTE — Progress Notes (Unsigned)
ACUTE VISIT No chief complaint on file.  HPI: [name] is a 52 y.o. male, who is here today complaining of *** HPI  Review of Systems See other pertinent positives and negatives in HPI.  Current Outpatient Medications on File Prior to Visit  Medication Sig Dispense Refill   ALPRAZolam (XANAX) 1 MG tablet TAKE 1 TABLET BY MOUTH 3 TIMES A DAY AS NEEDED FOR ANXIETY,PANIC ATTACKS OR AGITATION 270 tablet 1   atorvastatin (LIPITOR) 40 MG tablet TAKE 1 TABLET BY MOUTH EVERY DAY 90 tablet 3   gabapentin (NEURONTIN) 300 MG capsule TAKE 1 TO 3 CAPSULES BY MOUTH 2-3 HOURS BEFORE BEDTIME *KEEP APPT* 60 capsule 0   lamoTRIgine (LAMICTAL) 150 MG tablet Take 2 tablets (300 mg total) by mouth at bedtime. 180 tablet 1   linaclotide (LINZESS) 290 MCG CAPS capsule Take 1 capsule (290 mcg total) by mouth daily before breakfast. 30 capsule 5   metoCLOPramide (REGLAN) 5 MG tablet TAKE 1 TABLET BY MOUTH 3 TIMES DAILY BEFORE MEALS. 270 tablet 1   Na Sulfate-K Sulfate-Mg Sulf (SUPREP BOWEL PREP KIT) 17.5-3.13-1.6 GM/177ML SOLN Take 1 kit by mouth as directed. 324 mL 0   omeprazole (PRILOSEC) 20 MG capsule Take 1 capsule (20 mg total) by mouth daily. 30 capsule 5   prochlorperazine (COMPAZINE) 10 MG tablet TAKE 1 TABLET BY MOUTH EVERY DAY AS NEEDED 30 tablet 0   QUEtiapine (SEROQUEL) 300 MG tablet Take 1 tablet (300 mg total) by mouth at bedtime. 90 tablet 1   zolpidem (AMBIEN) 10 MG tablet TAKE 1 TABLET BY MOUTH EVERYDAY AT BEDTIME 30 tablet 5   [DISCONTINUED] omeprazole (PRILOSEC OTC) 20 MG tablet 1 tablet twice daily. Do not eat for one hour after ingestion of the medication 60 tablet 11   No current facility-administered medications on file prior to visit.    Past Medical History:  Diagnosis Date   ALLERGIC RHINITIS 05/10/2009   Allergy    anemia   Anxiety    Bipolar disorder (HCC)    see medications patient to bring   Chronic headaches    DDD (degenerative disc disease)    DEPRESSION  05/10/2009   Gastroparesis    GERD 05/10/2009   LIBIDO, DECREASED 05/10/2009   Neuromuscular disorder (HCC)    pt. denies 08/01/16   Osgood-Schlatter's disease    SLEEP APNEA, OBSTRUCTIVE 06/12/2009   CPAP not utilized after 30 lb. weight loss   SMOKER 06/12/2009   TESTICULAR HYPOFUNCTION 06/12/2009   WEIGHT GAIN 05/10/2009   Allergies  Allergen Reactions   Topamax [Topiramate]     Sleep walking, hallucinations, "I get mean, can't help it"    Social History   Socioeconomic History   Marital status: Married    Spouse name: Sean Wu   Number of children: 0   Years of education: 14   Highest education level: Not on file  Occupational History   Occupation: Unemployeed  Tobacco Use   Smoking status: Former    Packs/day: 0.50    Years: 15.00    Additional pack years: 0.00    Total pack years: 7.50    Types: Cigarettes   Smokeless tobacco: Former    Types: Chew    Quit date: 09/01/1998   Tobacco comments:    tobacco info given 08/01/13  Vaping Use   Vaping Use: Never used  Substance and Sexual Activity   Alcohol use: Not Currently    Alcohol/week: 1.0 standard drink of alcohol    Types: 1 Cans  of beer per week    Comment: social, rare   Drug use: No   Sexual activity: Yes  Other Topics Concern   Not on file  Social History Narrative   Lives with wife at home   Caffeine use- tea, 3 glasses daily   Social Determinants of Health   Financial Resource Strain: Not on file  Food Insecurity: Not on file  Transportation Needs: Not on file  Physical Activity: Not on file  Stress: Not on file  Social Connections: Not on file    There were no vitals filed for this visit. There is no height or weight on file to calculate BMI.  Physical Exam  ASSESSMENT AND PLAN: There are no diagnoses linked to this encounter.  No follow-ups on file.  Emmamarie Kluender G. [place], MD  [facility]. [place] office.  Discharge Instructions   None

## 2023-01-12 ENCOUNTER — Encounter: Payer: Self-pay | Admitting: Family Medicine

## 2023-01-12 ENCOUNTER — Ambulatory Visit (INDEPENDENT_AMBULATORY_CARE_PROVIDER_SITE_OTHER): Payer: BC Managed Care – PPO

## 2023-01-12 ENCOUNTER — Ambulatory Visit: Payer: BC Managed Care – PPO | Admitting: Family Medicine

## 2023-01-12 VITALS — BP 120/80 | HR 105 | Temp 97.6°F | Resp 16 | Ht 68.0 in | Wt 213.2 lb

## 2023-01-12 DIAGNOSIS — R059 Cough, unspecified: Secondary | ICD-10-CM | POA: Diagnosis not present

## 2023-01-12 DIAGNOSIS — R053 Chronic cough: Secondary | ICD-10-CM

## 2023-01-12 DIAGNOSIS — K219 Gastro-esophageal reflux disease without esophagitis: Secondary | ICD-10-CM | POA: Diagnosis not present

## 2023-01-12 DIAGNOSIS — R062 Wheezing: Secondary | ICD-10-CM

## 2023-01-12 MED ORDER — BUDESONIDE-FORMOTEROL FUMARATE 80-4.5 MCG/ACT IN AERO
2.0000 | INHALATION_SPRAY | Freq: Two times a day (BID) | RESPIRATORY_TRACT | 3 refills | Status: DC
Start: 2023-01-12 — End: 2023-02-03

## 2023-01-12 MED ORDER — PANTOPRAZOLE SODIUM 40 MG PO TBEC
40.0000 mg | DELAYED_RELEASE_TABLET | Freq: Every day | ORAL | 0 refills | Status: DC
Start: 2023-01-12 — End: 2023-02-03

## 2023-01-12 MED ORDER — ALBUTEROL SULFATE HFA 108 (90 BASE) MCG/ACT IN AERS
2.0000 | INHALATION_SPRAY | Freq: Four times a day (QID) | RESPIRATORY_TRACT | 0 refills | Status: DC | PRN
Start: 2023-01-12 — End: 2023-02-03

## 2023-01-12 NOTE — Patient Instructions (Addendum)
A few things to remember from today's visit:  Persistent cough for 3 weeks or longer - Plan: DG Chest 2 View  Gastroesophageal reflux disease without esophagitis - Plan: pantoprazole (PROTONIX) 40 MG tablet  Wheezing - Plan: budesonide-formoterol (SYMBICORT) 80-4.5 MCG/ACT inhaler, albuterol (VENTOLIN HFA) 108 (90 Base) MCG/ACT inhaler  Symbicort added today. Swish a few times after use. Albuterol inh 2 puff every 6 hours for a week then as needed for wheezing or shortness of breath.  Stop Prilosec and try Protonix 30 min before breakfast. Nasal saline irrigation to help with runny nose and congestion.  If you need refills for medications you take chronically, please call your pharmacy. Do not use My Chart to request refills or for acute issues that need immediate attention. If you send a my chart message, it may take a few days to be addressed, specially if I am not in the office.  Please be sure medication list is accurate. If a new problem present, please set up appointment sooner than planned today.

## 2023-01-16 ENCOUNTER — Other Ambulatory Visit: Payer: Self-pay | Admitting: Family Medicine

## 2023-01-16 DIAGNOSIS — J181 Lobar pneumonia, unspecified organism: Secondary | ICD-10-CM

## 2023-01-16 DIAGNOSIS — R9389 Abnormal findings on diagnostic imaging of other specified body structures: Secondary | ICD-10-CM

## 2023-01-16 MED ORDER — AMOXICILLIN-POT CLAVULANATE 875-125 MG PO TABS
1.0000 | ORAL_TABLET | Freq: Two times a day (BID) | ORAL | 0 refills | Status: AC
Start: 2023-01-16 — End: 2023-01-23

## 2023-01-16 MED ORDER — DOXYCYCLINE HYCLATE 100 MG PO TABS
100.0000 mg | ORAL_TABLET | Freq: Two times a day (BID) | ORAL | 0 refills | Status: AC
Start: 2023-01-16 — End: 2023-01-23

## 2023-01-29 ENCOUNTER — Encounter: Payer: Self-pay | Admitting: Gastroenterology

## 2023-02-03 ENCOUNTER — Other Ambulatory Visit: Payer: Self-pay | Admitting: Family Medicine

## 2023-02-03 ENCOUNTER — Other Ambulatory Visit: Payer: Self-pay | Admitting: Psychiatry

## 2023-02-03 DIAGNOSIS — K219 Gastro-esophageal reflux disease without esophagitis: Secondary | ICD-10-CM

## 2023-02-03 DIAGNOSIS — F410 Panic disorder [episodic paroxysmal anxiety] without agoraphobia: Secondary | ICD-10-CM

## 2023-02-03 DIAGNOSIS — F4010 Social phobia, unspecified: Secondary | ICD-10-CM

## 2023-02-03 DIAGNOSIS — R062 Wheezing: Secondary | ICD-10-CM

## 2023-02-03 DIAGNOSIS — F3131 Bipolar disorder, current episode depressed, mild: Secondary | ICD-10-CM

## 2023-02-03 NOTE — Progress Notes (Deleted)
HPI:  [name] is a 52 y.o. male, who is here today to follow on recent visit.  Review of Systems See other pertinent positives and negatives in HPI.  Current Outpatient Medications on File Prior to Visit  Medication Sig Dispense Refill   albuterol (VENTOLIN HFA) 108 (90 Base) MCG/ACT inhaler TAKE 2 PUFFS BY MOUTH EVERY 6 HOURS AS NEEDED FOR WHEEZE OR SHORTNESS OF BREATH 18 each 1   ALPRAZolam (XANAX) 1 MG tablet TAKE 1 TABLET BY MOUTH 3 TIMES A DAY AS NEEDED FOR ANXIETY,PANIC ATTACKS OR AGITATION 270 tablet 1   atorvastatin (LIPITOR) 40 MG tablet TAKE 1 TABLET BY MOUTH EVERY DAY 90 tablet 3   budesonide-formoterol (SYMBICORT) 80-4.5 MCG/ACT inhaler INHALE 2 PUFFS INTO THE LUNGS TWICE A DAY 30.6 each 1   gabapentin (NEURONTIN) 300 MG capsule TAKE 1 TO 3 CAPSULES BY MOUTH 2-3 HOURS BEFORE BEDTIME *KEEP APPT* 60 capsule 0   lamoTRIgine (LAMICTAL) 150 MG tablet Take 2 tablets (300 mg total) by mouth at bedtime. 180 tablet 1   linaclotide (LINZESS) 290 MCG CAPS capsule Take 1 capsule (290 mcg total) by mouth daily before breakfast. 30 capsule 5   metoCLOPramide (REGLAN) 5 MG tablet TAKE 1 TABLET BY MOUTH 3 TIMES DAILY BEFORE MEALS. 270 tablet 1   Na Sulfate-K Sulfate-Mg Sulf (SUPREP BOWEL PREP KIT) 17.5-3.13-1.6 GM/177ML SOLN Take 1 kit by mouth as directed. 324 mL 0   pantoprazole (PROTONIX) 40 MG tablet TAKE 1 TABLET BY MOUTH EVERY DAY 90 tablet 0   prochlorperazine (COMPAZINE) 10 MG tablet TAKE 1 TABLET BY MOUTH EVERY DAY AS NEEDED 30 tablet 0   QUEtiapine (SEROQUEL) 300 MG tablet Take 1 tablet (300 mg total) by mouth at bedtime. 90 tablet 1   zolpidem (AMBIEN) 10 MG tablet TAKE 1 TABLET BY MOUTH EVERYDAY AT BEDTIME 30 tablet 5   [DISCONTINUED] omeprazole (PRILOSEC OTC) 20 MG tablet 1 tablet twice daily. Do not eat for one hour after ingestion of the medication 60 tablet 11   No current facility-administered medications on file prior to visit.    Past Medical History:   Diagnosis Date   ALLERGIC RHINITIS 05/10/2009   Allergy    anemia   Anxiety    Bipolar disorder (HCC)    see medications patient to bring   Chronic headaches    DDD (degenerative disc disease)    DEPRESSION 05/10/2009   Gastroparesis    GERD 05/10/2009   LIBIDO, DECREASED 05/10/2009   Neuromuscular disorder (HCC)    pt. denies 08/01/16   Osgood-Schlatter's disease    SLEEP APNEA, OBSTRUCTIVE 06/12/2009   CPAP not utilized after 30 lb. weight loss   SMOKER 06/12/2009   TESTICULAR HYPOFUNCTION 06/12/2009   WEIGHT GAIN 05/10/2009   Allergies  Allergen Reactions   Topamax [Topiramate]     Sleep walking, hallucinations, "I get mean, can't help it"    Social History   Socioeconomic History   Marital status: Married    Spouse name: [name]   Number of children: 0   Years of education: 14   Highest education level: Not on file  Occupational History   Occupation: Unemployeed  Tobacco Use   Smoking status: Some Days    Packs/day: 0.14    Years: 20.00    Additional pack years: 0.00    Total pack years: 2.80    Types: Cigarettes   Smokeless tobacco: Former    Types: Chew    Quit date: 09/01/1998   Tobacco comments:  tobacco info given 08/01/13  Vaping Use   Vaping Use: Never used  Substance and Sexual Activity   Alcohol use: Not Currently    Alcohol/week: 1.0 standard drink of alcohol    Types: 1 Cans of beer per week    Comment: social, rare   Drug use: No   Sexual activity: Yes  Other Topics Concern   Not on file  Social History Narrative   Lives with wife at home   Caffeine use- tea, 3 glasses daily   Social Determinants of Health   Financial Resource Strain: Not on file  Food Insecurity: Not on file  Transportation Needs: Not on file  Physical Activity: Not on file  Stress: Not on file  Social Connections: Not on file    There were no vitals filed for this visit. There is no height or weight on file to calculate BMI.  Physical Exam  ASSESSMENT AND  PLAN:  There are no diagnoses linked to this encounter.  No orders of the defined types were placed in this encounter.   No problem-specific Assessment & Plan notes found for this encounter.   No follow-ups on file.  Betty G. [place], MD  [facility]. [place] office.

## 2023-02-09 ENCOUNTER — Ambulatory Visit: Payer: BC Managed Care – PPO | Admitting: Family Medicine

## 2023-02-11 ENCOUNTER — Encounter: Payer: Self-pay | Admitting: Gastroenterology

## 2023-02-11 ENCOUNTER — Ambulatory Visit (AMBULATORY_SURGERY_CENTER): Payer: BC Managed Care – PPO | Admitting: Gastroenterology

## 2023-02-11 VITALS — BP 130/85 | HR 64 | Temp 98.6°F | Resp 11 | Ht 68.0 in | Wt 217.0 lb

## 2023-02-11 DIAGNOSIS — Z1211 Encounter for screening for malignant neoplasm of colon: Secondary | ICD-10-CM | POA: Diagnosis not present

## 2023-02-11 DIAGNOSIS — D120 Benign neoplasm of cecum: Secondary | ICD-10-CM

## 2023-02-11 DIAGNOSIS — K635 Polyp of colon: Secondary | ICD-10-CM | POA: Diagnosis not present

## 2023-02-11 DIAGNOSIS — R1310 Dysphagia, unspecified: Secondary | ICD-10-CM | POA: Diagnosis not present

## 2023-02-11 DIAGNOSIS — K621 Rectal polyp: Secondary | ICD-10-CM | POA: Diagnosis not present

## 2023-02-11 DIAGNOSIS — D128 Benign neoplasm of rectum: Secondary | ICD-10-CM

## 2023-02-11 DIAGNOSIS — K2970 Gastritis, unspecified, without bleeding: Secondary | ICD-10-CM | POA: Diagnosis not present

## 2023-02-11 MED ORDER — SODIUM CHLORIDE 0.9 % IV SOLN
500.0000 mL | Freq: Once | INTRAVENOUS | Status: DC
Start: 2023-02-11 — End: 2023-02-11

## 2023-02-11 MED ORDER — PANTOPRAZOLE SODIUM 40 MG PO TBEC: 40.0000 mg | DELAYED_RELEASE_TABLET | Freq: Every day | ORAL | 3 refills | Status: DC

## 2023-02-11 NOTE — Op Note (Signed)
[facility] Patient Name: Sean Wu Procedure Date: 02/11/2023 2:22 PM MRN: [id number] Endoscopist: Napoleon Form , MD, [id number] Age: 52 Referring MD:  Date of Birth: [birth year] Gender: Male Account #: [id number] Procedure:                Colonoscopy Indications:              Screening for colorectal malignant neoplasm Medicines:                Monitored Anesthesia Care Procedure:                Pre-Anesthesia Assessment:                           - Prior to the procedure, a History and Physical                            was performed, and patient medications and                            allergies were reviewed. The patient's tolerance of                            previous anesthesia was also reviewed. The risks                            and benefits of the procedure and the sedation                            options and risks were discussed with the patient.                            All questions were answered, and informed consent                            was obtained. Prior Anticoagulants: The patient has                            taken no anticoagulant or antiplatelet agents. ASA                            Grade Assessment: II - A patient with mild systemic                            disease. After reviewing the risks and benefits,                            the patient was deemed in satisfactory condition to                            undergo the procedure.                           After obtaining informed consent, the colonoscope  was passed under direct vision. Throughout the                            procedure, the patient's blood pressure, pulse, and                            oxygen saturations were monitored continuously. The                            Olympus [id number]-[id number] (#[id number]) Colonoscope was                            introduced through the anus and advanced to the the                            cecum,  identified by appendiceal orifice and                            ileocecal valve. The colonoscopy was performed                            without difficulty. The patient tolerated the                            procedure well. The quality of the bowel                            preparation was adequate. The ileocecal valve,                            appendiceal orifice, and rectum were photographed. Scope In: 2:44:40 PM Scope Out: 3:01:16 PM Scope Withdrawal Time: 0 hours 10 minutes 33 seconds  Total Procedure Duration: 0 hours 16 minutes 36 seconds  Findings:                 The perianal and digital rectal examinations were                            normal.                           Three sessile polyps were found in the rectum and                            cecum. The polyps were 3 to 7 mm in size. These                            polyps were removed with a cold snare. Resection                            and retrieval were complete.                           Scattered small-mouthed diverticula were found in  the sigmoid colon, descending colon and transverse                            colon.                           Non-bleeding external and internal hemorrhoids were                            found during retroflexion. The hemorrhoids were                            small. Complications:            No immediate complications. Estimated Blood Loss:     Estimated blood loss was minimal. Impression:               - Three 3 to 7 mm polyps in the rectum and in the                            cecum, removed with a cold snare. Resected and                            retrieved.                           - Diverticulosis in the sigmoid colon, in the                            descending colon and in the transverse colon.                           - Non-bleeding external and internal hemorrhoids. Recommendation:           - Patient has a contact number available  for                            emergencies. The signs and symptoms of potential                            delayed complications were discussed with the                            patient. Return to normal activities tomorrow.                            Written discharge instructions were provided to the                            patient.                           - Resume previous diet.                           - Continue present medications.                           -  Await pathology results.                           - Repeat colonoscopy in 5-10 years for surveillance                            based on pathology results. Napoleon Form, MD 02/11/2023 3:13:08 PM This report has been signed electronically.

## 2023-02-11 NOTE — Progress Notes (Signed)
Uneventful anesthetic. Report to pacu rn. Vss. Care resumed by rn. 

## 2023-02-11 NOTE — Progress Notes (Signed)
[facility] History and Physical   Primary Care Physician:  [place], Betty G, MD   Reason for Procedure:  Dysphagia, persistent GERD, colorectal cancer screening  Plan:    EGD and colonoscopy with possible interventions as needed     HPI: Sean Wu is a very pleasant 52 y.o. male here for EGD for dysphagia, persistent GERD symptoms and colonoscopy for colorectal cancer screening.  The risks and benefits as well as alternatives of endoscopic procedure(s) have been discussed and reviewed. All questions answered. The patient agrees to proceed.    Past Medical History:  Diagnosis Date   ALLERGIC RHINITIS 05/10/2009   Allergy    anemia   Anxiety    Bipolar disorder (HCC)    see medications patient to bring   Chronic headaches    DDD (degenerative disc disease)    DEPRESSION 05/10/2009   Gastroparesis    GERD 05/10/2009   LIBIDO, DECREASED 05/10/2009   Neuromuscular disorder (HCC)    pt. denies 08/01/16   Osgood-Schlatter's disease    SLEEP APNEA, OBSTRUCTIVE 06/12/2009   CPAP not utilized after 30 lb. weight loss   SMOKER 06/12/2009   TESTICULAR HYPOFUNCTION 06/12/2009   WEIGHT GAIN 05/10/2009    Past Surgical History:  Procedure Laterality Date   ANKLE SURGERY Left    COLONOSCOPY  2014   normal   EYE SURGERY     eye implant   FRACTURE SURGERY     surgery on right ankle for ligament torn 2013   LASIK Bilateral    SHOULDER ARTHROSCOPY WITH DISTAL CLAVICLE RESECTION Right 02/26/2015   Procedure: SHOULDER DIAGNOSTIC ARTHROSCOPY WITH OPEN DISTAL CLAVICLE EXCISION;  Surgeon: Jones Broom, MD;  Location: [facility];  Service: Orthopedics;  Laterality: Right;  Right diagnostic arthroscopy, open distal clavical excision    Prior to Admission medications   Medication Sig Start Date End Date Taking? Authorizing Provider  ALPRAZolam Prudy Feeler) 1 MG tablet TAKE 1 TABLET BY MOUTH 3 TIMES A DAY AS NEEDED FOR ANXIETY,PANIC ATTACKS OR AGITATION 12/22/22  Yes  Corie Chiquito, PMHNP  QUEtiapine (SEROQUEL) 300 MG tablet Take 1 tablet (300 mg total) by mouth at bedtime. 12/22/22  Yes Corie Chiquito, PMHNP  albuterol (VENTOLIN HFA) 108 (90 Base) MCG/ACT inhaler TAKE 2 PUFFS BY MOUTH EVERY 6 HOURS AS NEEDED FOR WHEEZE OR SHORTNESS OF BREATH 02/03/23   [place], Betty G, MD  atorvastatin (LIPITOR) 40 MG tablet TAKE 1 TABLET BY MOUTH EVERY DAY Patient not taking: Reported on 02/11/2023 10/01/20   [place], Betty G, MD  budesonide-formoterol [facility]) 80-4.5 MCG/ACT inhaler INHALE 2 PUFFS INTO THE LUNGS TWICE A DAY Patient not taking: Reported on 02/11/2023 02/03/23   [place], Betty G, MD  gabapentin (NEURONTIN) 300 MG capsule TAKE 1 TO 3 CAPSULES BY MOUTH 2-3 HOURS BEFORE BEDTIME *KEEP APPT* Patient not taking: Reported on 02/11/2023 12/29/21   Corie Chiquito, PMHNP  lamoTRIgine (LAMICTAL) 150 MG tablet Take 2 tablets (300 mg total) by mouth at bedtime. 11/27/22 02/25/23  Corie Chiquito, PMHNP  linaclotide [facility]) 290 MCG CAPS capsule Take 1 capsule (290 mcg total) by mouth daily before breakfast. 12/31/22   Meredith Pel, NP  metoCLOPramide (REGLAN) 5 MG tablet TAKE 1 TABLET BY MOUTH 3 TIMES DAILY BEFORE MEALS. 04/22/21   [place], Betty G, MD  pantoprazole (PROTONIX) 40 MG tablet TAKE 1 TABLET BY MOUTH EVERY DAY 02/03/23   [place], Betty G, MD  prochlorperazine (COMPAZINE) 10 MG tablet TAKE 1 TABLET BY MOUTH EVERY DAY AS NEEDED 05/04/22  [place], Betty G, MD  zolpidem (AMBIEN) 10 MG tablet TAKE 1 TABLET BY MOUTH EVERYDAY AT BEDTIME 12/02/22   Corie Chiquito, PMHNP  omeprazole (PRILOSEC OTC) 20 MG tablet 1 tablet twice daily. Do not eat for one hour after ingestion of the medication 01/10/11 01/19/12  Gordy Savers, MD    Current Outpatient Medications  Medication Sig Dispense Refill   ALPRAZolam (XANAX) 1 MG tablet TAKE 1 TABLET BY MOUTH 3 TIMES A DAY AS NEEDED FOR ANXIETY,PANIC ATTACKS OR AGITATION 270 tablet 1   QUEtiapine (SEROQUEL) 300 MG tablet Take 1 tablet  (300 mg total) by mouth at bedtime. 90 tablet 1   albuterol (VENTOLIN HFA) 108 (90 Base) MCG/ACT inhaler TAKE 2 PUFFS BY MOUTH EVERY 6 HOURS AS NEEDED FOR WHEEZE OR SHORTNESS OF BREATH 18 each 1   atorvastatin (LIPITOR) 40 MG tablet TAKE 1 TABLET BY MOUTH EVERY DAY (Patient not taking: Reported on 02/11/2023) 90 tablet 3   budesonide-formoterol (SYMBICORT) 80-4.5 MCG/ACT inhaler INHALE 2 PUFFS INTO THE LUNGS TWICE A DAY (Patient not taking: Reported on 02/11/2023) 30.6 each 1   gabapentin (NEURONTIN) 300 MG capsule TAKE 1 TO 3 CAPSULES BY MOUTH 2-3 HOURS BEFORE BEDTIME *KEEP APPT* (Patient not taking: Reported on 02/11/2023) 60 capsule 0   lamoTRIgine (LAMICTAL) 150 MG tablet Take 2 tablets (300 mg total) by mouth at bedtime. 180 tablet 1   linaclotide (LINZESS) 290 MCG CAPS capsule Take 1 capsule (290 mcg total) by mouth daily before breakfast. 30 capsule 5   metoCLOPramide (REGLAN) 5 MG tablet TAKE 1 TABLET BY MOUTH 3 TIMES DAILY BEFORE MEALS. 270 tablet 1   pantoprazole (PROTONIX) 40 MG tablet TAKE 1 TABLET BY MOUTH EVERY DAY 90 tablet 0   prochlorperazine (COMPAZINE) 10 MG tablet TAKE 1 TABLET BY MOUTH EVERY DAY AS NEEDED 30 tablet 0   zolpidem (AMBIEN) 10 MG tablet TAKE 1 TABLET BY MOUTH EVERYDAY AT BEDTIME 30 tablet 5   Current Facility-Administered Medications  Medication Dose Route Frequency Provider Last Rate Last Admin   0.9 %  sodium chloride infusion  500 mL Intravenous Once Elvin Banker, Eleonore Chiquito, MD        Allergies as of 02/11/2023 - Review Complete 02/11/2023  Allergen Reaction Noted   Topamax [topiramate]  02/26/2016    Family History  Problem Relation Age of Onset   Heart disease Maternal Grandfather    Irritable bowel syndrome Father    Alcoholism Father    Heart attack Father    Bipolar disorder Father    Colon cancer Neg Hx    Rectal cancer Neg Hx    Stomach cancer Neg Hx     Social History   Socioeconomic History   Marital status: Married    Spouse name: [name]    Number of children: 0   Years of education: 14   Highest education level: Not on file  Occupational History   Occupation: Unemployeed  Tobacco Use   Smoking status: Some Days    Packs/day: 0.14    Years: 20.00    Additional pack years: 0.00    Total pack years: 2.80    Types: Cigarettes   Smokeless tobacco: Former    Types: Chew    Quit date: 09/01/1998   Tobacco comments:    tobacco info given 08/01/13  Vaping Use   Vaping Use: Never used  Substance and Sexual Activity   Alcohol use: Not Currently    Alcohol/week: 1.0 standard drink of alcohol    Types: 1  Cans of beer per week    Comment: social, rare   Drug use: No   Sexual activity: Yes  Other Topics Concern   Not on file  Social History Narrative   Lives with wife at home   Caffeine use- tea, 3 glasses daily   Social Determinants of Health   Financial Resource Strain: Not on file  Food Insecurity: Not on file  Transportation Needs: Not on file  Physical Activity: Not on file  Stress: Not on file  Social Connections: Not on file  Intimate Partner Violence: Not on file    Review of Systems:  All other review of systems negative except as mentioned in the HPI.  Physical Exam: Vital signs in last 24 hours: Blood Pressure 113/81   Pulse 83   Temperature 98.6 F (37 C)   Height 5\' 8"  (1.727 m)   Weight 217 lb (98.4 kg)   Oxygen Saturation 99%   Body Mass Index 32.99 kg/m  General:   Alert, NAD Lungs:  Clear .   Heart:  Regular rate and rhythm Abdomen:  Soft, nontender and nondistended. Neuro/Psych:  Alert and cooperative. Normal mood and affect. A and O x 3  Reviewed labs, radiology imaging, old records and pertinent past GI work up  Patient is appropriate for planned procedure(s) and anesthesia in an ambulatory setting   K. Scherry Ran , MD [phone]

## 2023-02-11 NOTE — Progress Notes (Signed)
Called to room to assist during endoscopic procedure.  Patient ID and intended procedure confirmed with present staff. Received instructions for my participation in the procedure from the performing physician.  

## 2023-02-11 NOTE — Op Note (Signed)
[facility] Patient Name: Sean Wu Procedure Date: 02/11/2023 2:30 PM MRN: [id number] Endoscopist: Napoleon Form , MD, [id number] Age: 52 Referring MD:  Date of Birth: 10-21-1970 Gender: Male Account #: [id number] Procedure:                Upper GI endoscopy Indications:              Dysphagia, Esophageal reflux symptoms that persist                            despite appropriate therapy Medicines:                Monitored Anesthesia Care Procedure:                Pre-Anesthesia Assessment:                           - Prior to the procedure, a History and Physical                            was performed, and patient medications and                            allergies were reviewed. The patient's tolerance of                            previous anesthesia was also reviewed. The risks                            and benefits of the procedure and the sedation                            options and risks were discussed with the patient.                            All questions were answered, and informed consent                            was obtained. Prior Anticoagulants: The patient has                            taken no anticoagulant or antiplatelet agents. ASA                            Grade Assessment: II - A patient with mild systemic                            disease. After reviewing the risks and benefits,                            the patient was deemed in satisfactory condition to                            undergo the procedure.  After obtaining informed consent, the endoscope was                            passed under direct vision. Throughout the                            procedure, the patient's blood pressure, pulse, and                            oxygen saturations were monitored continuously. The                            GIF [id number] #[id number] was introduced through the                            mouth, and advanced to the  second part of duodenum.                            The upper GI endoscopy was accomplished without                            difficulty. The patient tolerated the procedure                            well. Scope In: Scope Out: Findings:                 The Z-line was regular and was found 40 cm from the                            incisors.                           No endoscopic abnormality was evident in the                            esophagus to explain the patient's complaint of                            dysphagia. It was decided, however, to proceed with                            dilation of the entire esophagus. The scope was                            withdrawn. Dilation was performed with a Maloney                            dilator with no resistance at 54 Fr. The dilation                            site was examined following endoscope reinsertion  and showed no change.                           Patchy mild inflammation characterized by                            congestion (edema), erosions and erythema was found                            in the entire examined stomach. Biopsies were taken                            with a cold forceps for Helicobacter pylori testing.                           The cardia and gastric fundus were normal on                            retroflexion.                           The examined duodenum was normal. Complications:            No immediate complications. Estimated Blood Loss:     Estimated blood loss was minimal. Impression:               - Z-line regular, 40 cm from the incisors.                           - No endoscopic esophageal abnormality to explain                            patient's dysphagia. Esophagus dilated. Dilated.                           - Gastritis. Biopsied.                           - Normal examined duodenum. Recommendation:           - Patient has a contact number available for                             emergencies. The signs and symptoms of potential                            delayed complications were discussed with the                            patient. Return to normal activities tomorrow.                            Written discharge instructions were provided to the                            patient.                           -  Resume previous diet.                           - Continue present medications.                           - Await pathology results.                           - Use Protonix (pantoprazole) 40 mg PO daily. Rx                            for 90 days with 3 refills                           - Follow an antireflux regimen. Napoleon Form, MD 02/11/2023 3:15:59 PM This report has been signed electronically.

## 2023-02-11 NOTE — Patient Instructions (Signed)
Please read handouts provided. Continue present medications. Await pathology results. Use Protonix ( pantoprazole ) 40 mg daily. Follow an antireflux regimen.   YOU HAD AN ENDOSCOPIC PROCEDURE TODAY AT THE [facility]:   Refer to the procedure report that was given to you for any specific questions about what was found during the examination.  If the procedure report does not answer your questions, please call your gastroenterologist to clarify.  If you requested that your care partner not be given the details of your procedure findings, then the procedure report has been included in a sealed envelope for you to review at your convenience later.  YOU SHOULD EXPECT: Some feelings of bloating in the abdomen. Passage of more gas than usual.  Walking can help get rid of the air that was put into your GI tract during the procedure and reduce the bloating. If you had a lower endoscopy (such as a colonoscopy or flexible sigmoidoscopy) you may notice spotting of blood in your stool or on the toilet paper. If you underwent a bowel prep for your procedure, you may not have a normal bowel movement for a few days.  Please Note:  You might notice some irritation and congestion in your nose or some drainage.  This is from the oxygen used during your procedure.  There is no need for concern and it should clear up in a day or so.  SYMPTOMS TO REPORT IMMEDIATELY:  Following lower endoscopy (colonoscopy or flexible sigmoidoscopy):  Excessive amounts of blood in the stool  Significant tenderness or worsening of abdominal pains  Swelling of the abdomen that is new, acute  Fever of 100F or higher  Following upper endoscopy (EGD)  Vomiting of blood or coffee ground material  New chest pain or pain under the shoulder blades  Painful or persistently difficult swallowing  New shortness of breath  Fever of 100F or higher  Black, tarry-looking stools  For urgent or emergent issues, a  gastroenterologist can be reached at any hour by calling [phone]. Do not use MyChart messaging for urgent concerns.    DIET:  We do recommend a small meal at first, but then you may proceed to your regular diet.  Drink plenty of fluids but you should avoid alcoholic beverages for 24 hours.  ACTIVITY:  You should plan to take it easy for the rest of today and you should NOT DRIVE or use heavy machinery until tomorrow (because of the sedation medicines used during the test).    FOLLOW UP: Our staff will call the number listed on your records the next business day following your procedure.  We will call around 7:15- 8:00 am to check on you and address any questions or concerns that you may have regarding the information given to you following your procedure. If we do not reach you, we will leave a message.     If any biopsies were taken you will be contacted by phone or by letter within the next 1-3 weeks.  Please call us at [phone] if you have not heard about the biopsies in 3 weeks.    SIGNATURES/CONFIDENTIALITY: You and/or your care partner have signed paperwork which will be entered into your electronic medical record.  These signatures attest to the fact that that the information above on your After Visit Summary has been reviewed and is understood.  Full responsibility of the confidentiality of this discharge information lies with you and/or your care-partner.

## 2023-02-12 ENCOUNTER — Telehealth: Payer: Self-pay | Admitting: Internal Medicine

## 2023-02-12 ENCOUNTER — Telehealth: Payer: Self-pay | Admitting: *Deleted

## 2023-02-12 NOTE — Telephone Encounter (Signed)
Post procedure follow up call placed, no answer and left VM.  

## 2023-02-12 NOTE — Telephone Encounter (Signed)
Colonoscopy w/ cold snare x 3 and EGD w/ maloney dilation yesterday - no change w/Maloney dilation  Had been fine but tonight after dinner nausea, bloated + Tmax 101 No other sxs and IV site fine Has had a BM since procedures and is passing gas No back pain  Advised clear liqs Observe  If persistent issues go to ED  We will call back in AM to recheck also

## 2023-02-13 ENCOUNTER — Encounter: Payer: Self-pay | Admitting: Internal Medicine

## 2023-02-13 ENCOUNTER — Ambulatory Visit: Payer: BC Managed Care – PPO | Admitting: Internal Medicine

## 2023-02-13 VITALS — BP 110/70 | HR 98 | Temp 98.1°F | Ht 69.0 in | Wt 207.6 lb

## 2023-02-13 DIAGNOSIS — J301 Allergic rhinitis due to pollen: Secondary | ICD-10-CM | POA: Diagnosis not present

## 2023-02-13 DIAGNOSIS — J439 Emphysema, unspecified: Secondary | ICD-10-CM

## 2023-02-13 DIAGNOSIS — F17210 Nicotine dependence, cigarettes, uncomplicated: Secondary | ICD-10-CM

## 2023-02-13 DIAGNOSIS — J4489 Other specified chronic obstructive pulmonary disease: Secondary | ICD-10-CM | POA: Diagnosis not present

## 2023-02-13 MED ORDER — FLUTICASONE PROPIONATE 50 MCG/ACT NA SUSP
1.0000 | Freq: Every day | NASAL | 5 refills | Status: DC
Start: 2023-02-13 — End: 2024-03-28

## 2023-02-13 NOTE — Telephone Encounter (Signed)
Called patient. No answer. Left message of my call. Asked for a return call.

## 2023-02-13 NOTE — Progress Notes (Signed)
Sean Wu    [id number]    [birth year]  Primary Care Physician:Jordan, Timoteo Expose, MD  Referring Physician: [place], Betty G, MD [address] [place],  [place] [address] Reason for Consultation: cough Date of Consultation: 02/13/2023  Chief complaint:   Chief Complaint  Patient presents with   Consult    Had pna back in May, still has cough (dry).  Cough went completely for a little while and came back last week.     HPI: Sean Wu is a 52 y.o. man who presents for new patient for cough and shortness of breath. In may 2024 was diagnosed with pneumonia. Treated with abx. Cough went away.  Last had pneumonia 3 years ago prior to this. No childhood respiratory disease.   He has had shortness of breath for about a year. Breathing worse with anxiety, at rest and with exertion. Symptoms come and go, without any specific pattern.   Takes medication for depression and anxiety. No h/o inpatient hospitalization. He is supposed to take medication for HLD and HTN but doesn't get into the routine.   Was started on inhalers by PCP but never started them - says he wasn't in the habit and wasn't sure if it would help.  Cough is dry, nagging, worse at night. He does have seasonal allergies and post nasal drainage. He takes benadryl sometimes. He has reflux and takes omeprazole. This relieves his symptoms.    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  Exposures: lives at home with wife.  Smoking history: 40 pack years. current 1 ppd. Quit twice cold [place].   Social History   Occupational History   Occupation: Unemployeed  Tobacco Use   Smoking status: Some Days    Packs/day: 2.00    Years: 23.00    Additional pack years: 0.00    Total pack years: 46.00    Types: Cigarettes   Smokeless tobacco: Former    Types: Chew    Quit date: 09/01/1998   Tobacco comments:    tobacco info given 08/01/13.    Smokes 1 ppd.  02/13/23 hfb  Vaping  Use   Vaping Use: Never used  Substance and Sexual Activity   Alcohol use: Not Currently    Alcohol/week: 1.0 standard drink of alcohol    Types: 1 Cans of beer per week    Comment: social, rare   Drug use: No   Sexual activity: Yes    Relevant family history:  Family History  Problem Relation Age of Onset   Irritable bowel syndrome Father    Alcoholism Father    Heart attack Father    Bipolar disorder Father    Heart disease Maternal Grandfather    Colon cancer Neg Hx    Rectal cancer Neg Hx    Stomach cancer Neg Hx    Lung disease Neg Hx     Past Medical History:  Diagnosis Date   ALLERGIC RHINITIS 05/10/2009   Allergy    anemia   Anxiety    Bipolar disorder (HCC)    see medications patient to bring   Chronic headaches    DDD (degenerative disc disease)    DEPRESSION 05/10/2009   Gastroparesis    GERD 05/10/2009   LIBIDO, DECREASED 05/10/2009   Neuromuscular disorder (HCC)    pt. denies 08/01/16   Osgood-Schlatter's disease    SLEEP APNEA, OBSTRUCTIVE 06/12/2009   CPAP not utilized after 30 lb.  weight loss   SMOKER 06/12/2009   TESTICULAR HYPOFUNCTION 06/12/2009   WEIGHT GAIN 05/10/2009    Past Surgical History:  Procedure Laterality Date   ANKLE SURGERY Left    COLONOSCOPY  2014   normal   EYE SURGERY     eye implant   FRACTURE SURGERY     surgery on right ankle for ligament torn 2013   LASIK Bilateral    SHOULDER ARTHROSCOPY WITH DISTAL CLAVICLE RESECTION Right 02/26/2015   Procedure: SHOULDER DIAGNOSTIC ARTHROSCOPY WITH OPEN DISTAL CLAVICLE EXCISION;  Surgeon: Jones Broom, MD;  Location: [facility];  Service: Orthopedics;  Laterality: Right;  Right diagnostic arthroscopy, open distal clavical excision     Physical Exam: Blood pressure 110/70, pulse 98, temperature 98.1 F (36.7 C), temperature source Oral, height 5\' 9"  (1.753 m), weight 207 lb 9.6 oz (94.2 kg), SpO2 94 %. Gen:      No acute distress ENT:  +mild nasal debris,  +cobblestoning, no nasal polyps, mucus membranes moist Lungs:    No increased respiratory effort, symmetric chest wall excursion, clear to auscultation bilaterally, no wheezes or crackles CV:         Regular rate and rhythm; no murmurs, rubs, or gallops.  No pedal edema Abd:      + bowel sounds; soft, non-tender; no distension MSK: no acute synovitis of DIP or PIP joints, no mechanics hands.  Skin:      Warm and dry; no rashes Neuro: normal speech, no focal facial asymmetry Psych: alert and oriented x3, normal mood and affect   Data Reviewed/Medical Decision Making:  Independent interpretation of tests: Imaging:  Review of patient's chest xray images may 2024 revealed no acute process. The patient's images have been independently reviewed by me.    PFTs:   Labs:  Lab Results  Component Value Date   NA 135 08/28/2021   K 3.9 08/28/2021   CO2 26 08/28/2021   GLUCOSE 129 (H) 08/28/2021   BUN 15 08/28/2021   CREATININE 0.73 08/28/2021   CALCIUM 9.0 08/28/2021   GFR 95.60 03/31/2017   GFRNONAA >60 08/28/2021   Lab Results  Component Value Date   WBC 8.0 08/28/2021   HGB 15.5 08/28/2021   HCT 45.9 08/28/2021   MCV 85.8 08/28/2021   PLT 340 08/28/2021     Immunization status:  Immunization History  Administered Date(s) Administered   Influenza Split 06/15/2012   Influenza,inj,Quad PF,6+ Mos 06/03/2019   Influenza-Unspecified 06/13/2015   PFIZER(Purple Top)SARS-COV-2 Vaccination 11/11/2019, 12/02/2019   Tdap 11/14/2014     I reviewed prior external note(s) from primary care  I reviewed the result(s) of the labs and imaging as noted above.   I have ordered pft  Assessment:  Chronic cough - likely related to post nasal drainage from allergies Shortness of breath - likely COPD Ongoing tobacco use disorder  Plan/Recommendations:   I think the shortness of breath is related to COPD - lung disease from smoking.  Go ahead and start the symbicort 2 puffs twice a day.  Gargle after use.   Will  obtain Full set of PFTs  Take the albuterol rescue inhaler every 4 to 6 hours as needed for wheezing or shortness of breath.  Your cough is likely related to allergies.  Start flonase nasal spray and you can keep taking benadryl at night time.   We discussed disease management and progression at length today.   Smoking Cessation Counseling:  1. The patient is an everyday smoker and symptomatic  due to the following condition copd 2. The patient is currently pre-contemplative in quitting smoking. 3. I advised patient to quit smoking. 4. We identified patient specific barriers to change.  5. I personally spent 3 minutes counseling the patient regarding tobacco use disorder. 6. We discussed management of stress and anxiety to help with smoking cessation, when applicable. 7. We discussed nicotine replacement therapy, Wellbutrin, Chantix as possible options. 8. I advised setting a quit date. 9. Follow?up arranged with our office to continue ongoing discussions. 10.Resources given to patient including quit hotline.   Return to Care: Return in about 3 months (around 05/16/2023).  Durel Salts, MD Pulmonary and Critical Care Medicine [facility] [phone]  CC: [place], Betty G, MD

## 2023-02-13 NOTE — Patient Instructions (Addendum)
Please schedule follow up scheduled with myself in 3 months.  If my schedule is not open yet, we will contact you with a reminder closer to that time. Please call [phone] if you haven't heard from [place] a month before.   Before your next visit I would like you to have: Full set of PFTs  I think the shortness of breath is related to COPD - lung disease from smoking.  Go ahead and start the symbicort 2 puffs twice a day. Gargle after use.   Take the albuterol rescue inhaler every 4 to 6 hours as needed for wheezing or shortness of breath. You can also take it 15 minutes before exercise or exertional activity. Side effects include heart racing or pounding, jitters or anxiety. If you have a history of an irregular heart rhythm, it can make this worse. Can also give some patients a hard time sleeping.  Your cough is likely related to allergies.  Start flonase nasal spray and you can keep taking benadryl at night time.   Flonase - 1 spray on each side of your nose twice a day for first week, then 1 spray on each side.   Instructions for use: If you also use a saline nasal spray or rinse, use that first. Position the head with the chin slightly tucked. Use the right hand to spray into the left nostril and the right hand to spray into the left nostril.   Point the bottle away from the septum of your nose (cartilage that divides the two sides of your nose).  Hold the nostril closed on the opposite side from where you will spray Spray once and gently sniff to pull the medicine into the higher parts of your nose.  Don't sniff too hard as the medicine will drain down the back of your throat instead. Repeat with a second spray on the same side if prescribed. Repeat on the other side of your nose.  Understanding COPD   What is COPD? COPD stands for chronic obstructive pulmonary (lung) disease. COPD is a general term used for several lung diseases.  COPD is an umbrella term and encompasses other   common diseases in this group like chronic bronchitis and emphysema. Chronic asthma may also be included in this group. While some patients with COPD have only chronic bronchitis or emphysema, most patients have a combination of both.  You might hear these terms used in exchange for one another.   COPD adds to the work of the heart. Diseased lungs may reduce the amount of oxygen that goes to the blood. High blood pressure in blood vessels from the heart to the lungs makes it difficult for the heart to pump. Lung disease can also cause the body to produce too many red blood cells which may make the blood thicker and harder to pump.   Patients who have COPD with low oxygen levels may develop an enlarged heart (cor pulmonale). This condition weakens the heart and causes increased shortness of breath and swelling in the legs and feet.   Chronic bronchitis Chronic bronchitis is irritation and inflammation (swelling) of the lining in the bronchial tubes (air passages). The irritation causes coughing and an excess amount of mucus in the airways. The swelling makes it difficult to get air in and out of the lungs. The small, hair-like structures on the inside of the airways (called cilia) may be damaged by the irritation. The cilia are then unable to help clean mucus from the airways.  Bronchitis is generally considered to be chronic when you have: a productive cough (cough up mucus) and shortness of breath that lasts about 3 months or more each year for 2 or more years in a row. Your doctor may define chronic bronchitis differently.   Emphysema Emphysema is the destruction, or breakdown, of the walls of the alveoli (air sacs) located at the end of the bronchial tubes. The damaged alveoli are not able to exchange oxygen and carbon dioxide between the lungs and the blood. The bronchioles lose their elasticity and collapse when you exhale, trapping air in the lungs. The trapped air keeps fresh air and oxygen from  entering the lungs.   Who is affected by COPD? Emphysema and chronic bronchitis affect approximately 16 million people in the [place], or close to 11 percent of the population.   Symptoms of COPD  Shortness of breath  Shortness of breath with mild exercise (walking, using the stairs, etc.)  Chronic, productive cough (with mucus)  A feeling of "tightness" in the chest  Wheezing   What causes COPD? The two primary causes of COPD are cigarette smoking and alpha1-antitrypsin (AAT) deficiency. Air pollution and occupational dusts may also contribute to COPD, especially when the person exposed to these substances is a cigarette smoker.  Cigarette smoke causes COPD by irritating the airways and creating inflammation that narrows the airways, making it more difficult to breathe. Cigarette smoke also causes the cilia to stop working properly so mucus and trapped particles are not cleaned from the airways. As a result, chronic cough and excess mucus production develop, leading to chronic bronchitis.  In some people, chronic bronchitis and infections can lead to destruction of the small airways, or emphysema.  AAT deficiency, an inherited disorder, can also lead to emphysema. Alpha antitrypsin (AAT) is a protective material produced in the liver and transported to the lungs to help combat inflammation. When there is not enough of the chemical AAT, the body is no longer protected from an enzyme in the white blood cells.   How is COPD diagnosed?  To diagnose COPD, the physician needs to know: Do you smoke?  Have you had chronic exposure to dust or air pollutants?  Do other members of your family have lung disease?  Are you short of breath?  Do you get short of breath with exercise?  Do you have chronic cough and/or wheezing?  Do you cough up excess mucus?  To help with the diagnosis, the physician will conduct a thorough physical exam which includes:  Listening to your lungs and heart  Checking  your blood pressure and pulse  Examining your nose and throat  Checking your feet and ankles for swelling   Laboratory and other tests Several laboratory and other tests are needed to confirm a diagnosis of COPD. These tests may include:  Chest X-ray to look for lung changes that could be caused by COPD   Spirometry and pulmonary function tests (PFTs) to determine lung volume and air flow  Pulse oximetry to measure the saturation of oxygen in the blood  Arterial blood gases (ABGs) to determine the amount of oxygen and carbon dioxide in the blood  Exercise testing to determine if the oxygen level in the blood drops during exercise   Treatment In the beginning stages of COPD, there is minimal shortness of breath that may be noticed only during exercise. As the disease progresses, shortness of breath may worsen and you may need to wear an oxygen device.  To help control other symptoms of COPD, the following treatments and lifestyle changes may be prescribed.  Quitting smoking  Avoiding cigarette smoke and other irritants  Taking medications including: a. bronchodilators b. anti-inflammatory agents c. oxygen d. antibiotics  Maintaining a healthy diet  Following a structured exercise program such as pulmonary rehabilitation Preventing respiratory infections  Controlling stress   If your COPD progresses, you may be eligible to be evaluated for lung volume reduction surgery or lung transplantation. You may also be eligible to participate in certain clinical trials (research studies). Ask your health care providers about studies being conducted in your hospital.   What is the outlook? Although COPD can not be cured, its symptoms can be treated and your quality of life can be improved. Your prognosis or outlook for the future will depend on how well your lungs are functioning, your symptoms, and how well you respond to and follow your treatment plan.

## 2023-02-19 ENCOUNTER — Encounter: Payer: Self-pay | Admitting: Gastroenterology

## 2023-02-27 ENCOUNTER — Ambulatory Visit (INDEPENDENT_AMBULATORY_CARE_PROVIDER_SITE_OTHER): Payer: Self-pay | Admitting: Psychiatry

## 2023-02-27 DIAGNOSIS — Z91199 Patient's noncompliance with other medical treatment and regimen due to unspecified reason: Secondary | ICD-10-CM

## 2023-02-27 NOTE — Progress Notes (Signed)
Patient did not show for scheduled office visit.   

## 2023-03-02 ENCOUNTER — Telehealth: Payer: Self-pay

## 2023-03-02 DIAGNOSIS — R9389 Abnormal findings on diagnostic imaging of other specified body structures: Secondary | ICD-10-CM

## 2023-03-02 NOTE — Telephone Encounter (Signed)
-----   Message from Kathreen Devoid, CMA sent at 01/19/2023  6:53 AM EDT ----- X-ray in 4-6 weeks.

## 2023-03-04 ENCOUNTER — Other Ambulatory Visit: Payer: Self-pay | Admitting: Psychiatry

## 2023-03-04 ENCOUNTER — Other Ambulatory Visit: Payer: Self-pay | Admitting: Family Medicine

## 2023-03-04 DIAGNOSIS — F3131 Bipolar disorder, current episode depressed, mild: Secondary | ICD-10-CM

## 2023-03-04 DIAGNOSIS — F410 Panic disorder [episodic paroxysmal anxiety] without agoraphobia: Secondary | ICD-10-CM

## 2023-03-04 DIAGNOSIS — R062 Wheezing: Secondary | ICD-10-CM

## 2023-03-04 DIAGNOSIS — F4010 Social phobia, unspecified: Secondary | ICD-10-CM

## 2023-03-26 ENCOUNTER — Ambulatory Visit: Payer: BC Managed Care – PPO | Admitting: Psychiatry

## 2023-03-31 ENCOUNTER — Other Ambulatory Visit: Payer: Self-pay | Admitting: Psychiatry

## 2023-03-31 DIAGNOSIS — F4010 Social phobia, unspecified: Secondary | ICD-10-CM

## 2023-03-31 DIAGNOSIS — F3131 Bipolar disorder, current episode depressed, mild: Secondary | ICD-10-CM

## 2023-03-31 DIAGNOSIS — F410 Panic disorder [episodic paroxysmal anxiety] without agoraphobia: Secondary | ICD-10-CM

## 2023-04-23 ENCOUNTER — Other Ambulatory Visit: Payer: Self-pay | Admitting: Psychiatry

## 2023-04-23 DIAGNOSIS — F3131 Bipolar disorder, current episode depressed, mild: Secondary | ICD-10-CM

## 2023-04-23 DIAGNOSIS — F4010 Social phobia, unspecified: Secondary | ICD-10-CM

## 2023-04-23 DIAGNOSIS — F410 Panic disorder [episodic paroxysmal anxiety] without agoraphobia: Secondary | ICD-10-CM

## 2023-05-13 ENCOUNTER — Ambulatory Visit: Payer: BC Managed Care – PPO | Admitting: Internal Medicine

## 2023-05-13 ENCOUNTER — Encounter: Payer: Self-pay | Admitting: Internal Medicine

## 2023-05-13 VITALS — BP 116/80 | HR 91 | Ht 69.5 in | Wt 204.2 lb

## 2023-05-13 DIAGNOSIS — J439 Emphysema, unspecified: Secondary | ICD-10-CM

## 2023-05-13 DIAGNOSIS — Z23 Encounter for immunization: Secondary | ICD-10-CM | POA: Diagnosis not present

## 2023-05-13 DIAGNOSIS — Z122 Encounter for screening for malignant neoplasm of respiratory organs: Secondary | ICD-10-CM | POA: Diagnosis not present

## 2023-05-13 DIAGNOSIS — F17200 Nicotine dependence, unspecified, uncomplicated: Secondary | ICD-10-CM

## 2023-05-13 DIAGNOSIS — J4489 Other specified chronic obstructive pulmonary disease: Secondary | ICD-10-CM | POA: Diagnosis not present

## 2023-05-13 LAB — PULMONARY FUNCTION TEST
DL/VA % pred: 102 %
DL/VA: 4.48 ml/min/mmHg/L
DLCO cor % pred: 107 %
DLCO cor: 30.33 ml/min/mmHg
DLCO unc % pred: 107 %
DLCO unc: 30.33 ml/min/mmHg
FEF 25-75 Post: 2.22 L/s
FEF 25-75 Pre: 1.31 L/s
FEF2575-%Change-Post: 69 %
FEF2575-%Pred-Post: 68 %
FEF2575-%Pred-Pre: 40 %
FEV1-%Change-Post: 40 %
FEV1-%Pred-Post: 71 %
FEV1-%Pred-Pre: 51 %
FEV1-Post: 2.68 L
FEV1-Pre: 1.91 L
FEV1FVC-%Change-Post: 19 %
FEV1FVC-%Pred-Pre: 64 %
FEV6-%Change-Post: 13 %
FEV6-%Pred-Post: 93 %
FEV6-%Pred-Pre: 82 %
FEV6-Post: 4.38 L
FEV6-Pre: 3.84 L
FEV6FVC-%Pred-Post: 104 %
FEV6FVC-%Pred-Pre: 104 %
FVC-%Change-Post: 17 %
FVC-%Pred-Post: 92 %
FVC-%Pred-Pre: 78 %
FVC-Post: 4.51 L
FVC-Pre: 3.84 L
Post FEV1/FVC ratio: 60 %
Post FEV6/FVC ratio: 100 %
Pre FEV1/FVC ratio: 50 %
Pre FEV6/FVC Ratio: 100 %
RV % pred: 238 %
RV: 5 L
TLC % pred: 126 %
TLC: 8.69 L

## 2023-05-13 NOTE — Patient Instructions (Signed)
Full PFT performed today. °

## 2023-05-13 NOTE — Addendum Note (Signed)
Addended by: Carnella Guadalajara on: 05/13/2023 05:15 PM   Modules accepted: Orders

## 2023-05-13 NOTE — Progress Notes (Signed)
Sean Wu    [id number]    Oct 03, 1970  Primary Care Physician:Jordan, Timoteo Expose, MD Date of Appointment: 05/13/2023 Established Patient Visit  Chief complaint:   Chief Complaint  Patient presents with   Follow-up    Pt is here for PFT and F/U to review results.     HPI: Sean Wu is a 52 y.o. man with tobacco use disorder and dyspnea.   Interval Updates: Here for follow up after PFTS.  They show moderately severe airflow limitation with significant response to bronchodilator. We started him on symbicort at the last visit. He feels an improvement in his breathing, no side effects. Not using albuterol much.   Cutting back from 1 ppd to 1/2 ppd.   I have reviewed the patient's family social and past medical history and updated as appropriate.   Past Medical History:  Diagnosis Date   ALLERGIC RHINITIS 05/10/2009   Allergy    anemia   Anxiety    Bipolar disorder (HCC)    see medications patient to bring   Chronic headaches    DDD (degenerative disc disease)    DEPRESSION 05/10/2009   Gastroparesis    GERD 05/10/2009   LIBIDO, DECREASED 05/10/2009   Neuromuscular disorder (HCC)    pt. denies 08/01/16   Osgood-Schlatter's disease    SLEEP APNEA, OBSTRUCTIVE 06/12/2009   CPAP not utilized after 30 lb. weight loss   SMOKER 06/12/2009   TESTICULAR HYPOFUNCTION 06/12/2009   WEIGHT GAIN 05/10/2009    Past Surgical History:  Procedure Laterality Date   ANKLE SURGERY Left    COLONOSCOPY  2014   normal   EYE SURGERY     eye implant   FRACTURE SURGERY     surgery on right ankle for ligament torn 2013   LASIK Bilateral    SHOULDER ARTHROSCOPY WITH DISTAL CLAVICLE RESECTION Right 02/26/2015   Procedure: SHOULDER DIAGNOSTIC ARTHROSCOPY WITH OPEN DISTAL CLAVICLE EXCISION;  Surgeon: Jones Broom, MD;  Location: [facility];  Service: Orthopedics;  Laterality: Right;  Right diagnostic arthroscopy, open distal clavical excision    Family History   Problem Relation Age of Onset   Irritable bowel syndrome Father    Alcoholism Father    Heart attack Father    Bipolar disorder Father    Heart disease Maternal Grandfather    Colon cancer Neg Hx    Rectal cancer Neg Hx    Stomach cancer Neg Hx    Lung disease Neg Hx     Social History   Occupational History   Occupation: Unemployeed  Tobacco Use   Smoking status: Some Days    Current packs/day: 0.50    Average packs/day: 1.8 packs/day for 29.7 years (52.3 ttl pk-yrs)    Types: Cigarettes    Start date: 1995   Smokeless tobacco: Former    Types: Chew    Quit date: 09/01/1998   Tobacco comments:    tobacco info given 08/01/13.    Smokes 1 ppd.  02/13/23 hfb  Vaping Use   Vaping status: Never Used  Substance and Sexual Activity   Alcohol use: Not Currently    Alcohol/week: 1.0 standard drink of alcohol    Types: 1 Cans of beer per week    Comment: social, rare   Drug use: No   Sexual activity: Yes     Physical Exam: Blood pressure 116/80, pulse 91, height 5' 9.5" (1.765 m), weight 204 lb 3.2 oz (92.6 kg),  SpO2 98%.  Gen:      No acute distress ENT:  no nasal polyps, mucus membranes moist Lungs:    No increased respiratory effort, symmetric chest wall excursion, clear to auscultation bilaterally, no wheezes or crackles CV:         Regular rate and rhythm; no murmurs, rubs, or gallops.  No pedal edema   Data Reviewed: Imaging: I have personally reviewed the CTPE study 2022 - no PE, no acute pulmonary process.   PFTs:     Latest Ref Rng & Units 05/13/2023   11:56 AM  PFT Results  FVC-Pre L 3.84  P  FVC-Predicted Pre % 78  P  FVC-Post L 4.51  P  FVC-Predicted Post % 92  P  Pre FEV1/FVC % % 50  P  Post FEV1/FCV % % 60  P  FEV1-Pre L 1.91  P  FEV1-Predicted Pre % 51  P  FEV1-Post L 2.68  P  DLCO uncorrected ml/min/mmHg 30.33  P  DLCO UNC% % 107  P  DLCO corrected ml/min/mmHg 30.33  P  DLCO COR %Predicted % 107  P  DLVA Predicted % 102  P  TLC L 8.69  P   TLC % Predicted % 126  P  RV % Predicted % 238  P    P Preliminary result   I have personally reviewed the patient's PFTs and moderately severe copd  Labs: Lab Results  Component Value Date   NA 135 08/28/2021   K 3.9 08/28/2021   CO2 26 08/28/2021   GLUCOSE 129 (H) 08/28/2021   BUN 15 08/28/2021   CREATININE 0.73 08/28/2021   CALCIUM 9.0 08/28/2021   GFR 95.60 03/31/2017   GFRNONAA >60 08/28/2021   Lab Results  Component Value Date   WBC 8.0 08/28/2021   HGB 15.5 08/28/2021   HCT 45.9 08/28/2021   MCV 85.8 08/28/2021   PLT 340 08/28/2021    Immunization status: Immunization History  Administered Date(s) Administered   Influenza Split 06/15/2012   Influenza,inj,Quad PF,6+ Mos 06/03/2019   Influenza-Unspecified 06/13/2015   PFIZER(Purple Top)SARS-COV-2 Vaccination 11/11/2019, 12/02/2019   Tdap 11/14/2014    External Records Personally Reviewed:   Assessment:  COPD, new diagnosis, FEV1 50% of predicted Tobacco use disorder Need for lung cancer screening Need for influenza vaccination  Plan/Recommendations: Breathing testing consistent with moderate COPD. You can still quit smoking and have improvement in your symptoms!  Continue the symbicort 2 puffs twice daily, gargle after use.   Use albuterol inhaler as needed for shortness of breath, or prior to exertion.   You will be eligible for lung cancer screening next year - I will refer you after age 3.   Flu shot today.   Return to Care: Return in about 6 months (around 11/10/2023).   Durel Salts, MD Pulmonary and Critical Care Medicine [facility] [phone]

## 2023-05-13 NOTE — Patient Instructions (Addendum)
It was a pleasure to see you today!  Please schedule follow up scheduled with myself in 6 months.  If my schedule is not open yet, we will contact you with a reminder closer to that time. Please call [phone] if you haven't heard from [place] a month before, and always call us sooner if issues or concerns arise. You can also send [place] a message through MyChart, but but aware that this is not to be used for urgent issues and it may take up to 5-7 days to receive a reply. Please be aware that you will likely be able to view your results before I have a chance to respond to them. Please give [place] 5 business days to respond to any non-urgent results.   Breathing testing consistent with moderate COPD. You can still quit smoking and have improvement in your symptoms!  Continue the symbicort 2 puffs twice daily, gargle after use.   Use albuterol inhaler as needed for shortness of breath, or prior to exertion.   You will be eligible for lung cancer screening next year - I will refer you after age 25.   Flu shot today.    What are the benefits of quitting smoking? Quitting smoking can lower your chances of getting or dying from heart disease, lung disease, kidney failure, infection, or cancer. It can also lower your chances of getting osteoporosis, a condition that makes your bones weak. Plus, quitting smoking can help your skin look younger and reduce the chances that you will have problems with sex.  Quitting smoking will improve your health no matter how old you are, and no matter how long or how much you have smoked.  What should I do if I want to quit smoking? The letters in the word "START" can help you remember the steps to take: S = Set a quit date. T = Tell family, friends, and the people around you that you plan to quit. A = Anticipate or plan ahead for the tough times you'll face while quitting. R = Remove cigarettes and other tobacco products from your home, car, and work. T = Talk to your  doctor about getting help to quit.  How can my doctor or nurse help? Your doctor or nurse can give you advice on the best way to quit. He or she can also put you in touch with counselors or other people you can call for support. Plus, your doctor or nurse can give you medicines to: ?Reduce your craving for cigarettes ?Reduce the unpleasant symptoms that happen when you stop smoking (called "withdrawal symptoms"). You can also get help from a free phone line (1-800-QUIT-NOW) or go online to [employer].  What are the symptoms of withdrawal? The symptoms include: ?Trouble sleeping ?Being irritable, anxious or restless ?Getting frustrated or angry ?Having trouble thinking clearly  Some people who stop smoking become temporarily depressed. Some people need treatment for depression, such as counseling or antidepressant medicines. Depressed people might: ?No longer enjoy or care about doing the things they used to like to do ?Feel sad, down, hopeless, nervous, or cranky most of the day, almost every day ?Lose or gain weight ?Sleep too much or too little ?Feel tired or like they have no energy ?Feel guilty or like they are worth nothing ?Forget things or feel confused ?Move and speak more slowly than usual ?Act restless or have trouble staying still ?Think about death or suicide  If you think you might be depressed, see your doctor or  nurse. Only someone trained in mental health can tell for sure if you are depressed. If you ever feel like you might hurt yourself, go straight to the nearest emergency department. Or you can call for an ambulance (in the [place] and [place], dial 9-1-1) or call your doctor or nurse right away and tell them it is an emergency. You can also reach the [place] National Suicide Prevention Lifeline at 251-546-0351 or http://hill.com/.  How do medicines help you stop smoking? Different medicines work in different ways: ?Nicotine replacement therapy  eases withdrawal and reduces your body's craving for nicotine, the main drug found in cigarettes. There are different forms of nicotine replacement, including skin patches, lozenges, gum, nasal sprays, and "puffers" or inhalers. Many can be bought without a prescription, while others might require one. ?Bupropion is a prescription medicine that reduces your desire to smoke. This medicine is sold under the brand names Zyban and Wellbutrin. It is also available in a generic version, which is cheaper than brand name medicines. ?Varenicline (brand names: Chantix, Champix) is a prescription medicine that reduces withdrawal symptoms and cigarette cravings. If you think you'd like to take varenicline and you have a history of depression, anxiety, or heart disease, discuss this with your doctor or nurse before taking the medicine. Varenicline can also increase the effects of alcohol in some people. It's a good idea to limit drinking while you're taking it, at least until you know how it affects you.  How does counseling work? Counseling can happen during formal office visits or just over the phone. A counselor can help you: ?Figure out what triggers your smoking and what to do instead ?Overcome cravings ?Figure out what went wrong when you tried to quit before  What works best? Studies show that people have the best luck at quitting if they take medicines to help them quit and work with a Veterinary surgeon. It might also be helpful to combine nicotine replacement with one of the prescription medicines that help people quit. In some cases, it might even make sense to take bupropion and varenicline together.  What about e-cigarettes? Sometimes people wonder if using electronic cigarettes, or "e-cigarettes," might help them quit smoking. Using e-cigarettes is also called "vaping." Doctors do not recommend e-cigarettes in place of medicines and counseling. That's because e-cigarettes still contain nicotine as well as  other substances that might be harmful. It's not clear how they can affect a person's health in the long term.  Will I gain weight if I quit? Yes, you might gain a few pounds. But quitting smoking will have a much more positive effect on your health than weighing a few pounds more. Plus, you can help prevent some weight gain by being more active and eating less. Taking the medicine bupropion might help control weight gain.   What else can I do to improve my chances of quitting? You can: ?Start exercising. ?Stay away from smokers and places that you associate with smoking. If people close to you smoke, ask them to quit with you. ?Keep gum, hard candy, or something to put in your mouth handy. If you get a craving for a cigarette, try one of these instead. ?Don't give up, even if you start smoking again. It takes most people a few tries before they succeed.  What if I am pregnant and I smoke? If you are pregnant, it's really important for the health of your baby that you quit. Ask your doctor what options you have, and what is safest  for your baby

## 2023-05-13 NOTE — Progress Notes (Signed)
Full PFT performed today. °

## 2023-05-22 ENCOUNTER — Other Ambulatory Visit: Payer: Self-pay | Admitting: Family Medicine

## 2023-05-22 DIAGNOSIS — R062 Wheezing: Secondary | ICD-10-CM

## 2023-05-31 ENCOUNTER — Other Ambulatory Visit: Payer: Self-pay | Admitting: Psychiatry

## 2023-05-31 DIAGNOSIS — F4010 Social phobia, unspecified: Secondary | ICD-10-CM

## 2023-05-31 DIAGNOSIS — G4733 Obstructive sleep apnea (adult) (pediatric): Secondary | ICD-10-CM

## 2023-05-31 DIAGNOSIS — F451 Undifferentiated somatoform disorder: Secondary | ICD-10-CM

## 2023-05-31 DIAGNOSIS — F3131 Bipolar disorder, current episode depressed, mild: Secondary | ICD-10-CM

## 2023-06-01 ENCOUNTER — Other Ambulatory Visit: Payer: Self-pay | Admitting: Family Medicine

## 2023-06-01 DIAGNOSIS — R110 Nausea: Secondary | ICD-10-CM

## 2023-06-02 MED ORDER — PROCHLORPERAZINE MALEATE 10 MG PO TABS: 5.0000 mg | ORAL_TABLET | Freq: Every day | ORAL | 0 refills | Status: DC | PRN

## 2023-06-27 ENCOUNTER — Other Ambulatory Visit: Payer: Self-pay | Admitting: Psychiatry

## 2023-06-27 DIAGNOSIS — F4010 Social phobia, unspecified: Secondary | ICD-10-CM

## 2023-06-27 DIAGNOSIS — F410 Panic disorder [episodic paroxysmal anxiety] without agoraphobia: Secondary | ICD-10-CM

## 2023-06-27 DIAGNOSIS — F3131 Bipolar disorder, current episode depressed, mild: Secondary | ICD-10-CM

## 2023-07-03 ENCOUNTER — Other Ambulatory Visit: Payer: Self-pay | Admitting: Psychiatry

## 2023-07-03 DIAGNOSIS — F451 Undifferentiated somatoform disorder: Secondary | ICD-10-CM

## 2023-07-03 DIAGNOSIS — F4010 Social phobia, unspecified: Secondary | ICD-10-CM

## 2023-07-03 DIAGNOSIS — G4733 Obstructive sleep apnea (adult) (pediatric): Secondary | ICD-10-CM

## 2023-07-03 DIAGNOSIS — F3131 Bipolar disorder, current episode depressed, mild: Secondary | ICD-10-CM

## 2023-07-05 NOTE — Telephone Encounter (Signed)
Sent MyChart message to schedule appt.

## 2023-07-07 NOTE — Telephone Encounter (Signed)
Lvm for pt to call and schedule

## 2023-07-07 NOTE — Telephone Encounter (Signed)
 Please call to schedule appt, did not respond to MyChart message to schedule.

## 2023-07-14 ENCOUNTER — Telehealth: Payer: Self-pay | Admitting: Psychiatry

## 2023-07-14 ENCOUNTER — Other Ambulatory Visit: Payer: Self-pay

## 2023-07-14 DIAGNOSIS — F3131 Bipolar disorder, current episode depressed, mild: Secondary | ICD-10-CM

## 2023-07-14 DIAGNOSIS — G4733 Obstructive sleep apnea (adult) (pediatric): Secondary | ICD-10-CM

## 2023-07-14 DIAGNOSIS — F451 Undifferentiated somatoform disorder: Secondary | ICD-10-CM

## 2023-07-14 DIAGNOSIS — F4010 Social phobia, unspecified: Secondary | ICD-10-CM

## 2023-07-14 NOTE — Telephone Encounter (Signed)
Pt made an appt for 07/23/23. He needs a refill on his ambien 10 mg. Pharmacy is [facility] at [address]

## 2023-07-14 NOTE — Telephone Encounter (Signed)
PENDED

## 2023-07-15 ENCOUNTER — Encounter: Payer: Self-pay | Admitting: Psychiatry

## 2023-07-15 MED ORDER — ZOLPIDEM TARTRATE 10 MG PO TABS: ORAL_TABLET | ORAL | 0 refills | Status: DC

## 2023-07-19 ENCOUNTER — Other Ambulatory Visit: Payer: Self-pay | Admitting: Family Medicine

## 2023-07-19 DIAGNOSIS — R062 Wheezing: Secondary | ICD-10-CM

## 2023-07-23 ENCOUNTER — Ambulatory Visit: Payer: BC Managed Care – PPO | Admitting: Psychiatry

## 2023-07-31 ENCOUNTER — Other Ambulatory Visit: Payer: Self-pay | Admitting: Psychiatry

## 2023-07-31 DIAGNOSIS — F3131 Bipolar disorder, current episode depressed, mild: Secondary | ICD-10-CM

## 2023-08-05 ENCOUNTER — Ambulatory Visit (INDEPENDENT_AMBULATORY_CARE_PROVIDER_SITE_OTHER): Payer: Self-pay | Admitting: Psychiatry

## 2023-08-05 ENCOUNTER — Other Ambulatory Visit: Payer: Self-pay | Admitting: Family Medicine

## 2023-08-05 DIAGNOSIS — Z91199 Patient's noncompliance with other medical treatment and regimen due to unspecified reason: Secondary | ICD-10-CM

## 2023-08-05 DIAGNOSIS — R110 Nausea: Secondary | ICD-10-CM

## 2023-08-07 NOTE — Progress Notes (Signed)
Patient did not show for scheduled office visit.   

## 2023-08-17 ENCOUNTER — Other Ambulatory Visit: Payer: Self-pay | Admitting: Psychiatry

## 2023-08-17 DIAGNOSIS — F410 Panic disorder [episodic paroxysmal anxiety] without agoraphobia: Secondary | ICD-10-CM

## 2023-08-17 DIAGNOSIS — F451 Undifferentiated somatoform disorder: Secondary | ICD-10-CM

## 2023-08-17 DIAGNOSIS — F3131 Bipolar disorder, current episode depressed, mild: Secondary | ICD-10-CM

## 2023-08-17 DIAGNOSIS — F4010 Social phobia, unspecified: Secondary | ICD-10-CM

## 2023-08-17 DIAGNOSIS — G4733 Obstructive sleep apnea (adult) (pediatric): Secondary | ICD-10-CM

## 2023-08-18 ENCOUNTER — Other Ambulatory Visit: Payer: Self-pay | Admitting: Psychiatry

## 2023-08-18 DIAGNOSIS — F4010 Social phobia, unspecified: Secondary | ICD-10-CM

## 2023-08-18 DIAGNOSIS — F451 Undifferentiated somatoform disorder: Secondary | ICD-10-CM

## 2023-08-18 DIAGNOSIS — G4733 Obstructive sleep apnea (adult) (pediatric): Secondary | ICD-10-CM

## 2023-08-18 DIAGNOSIS — F3131 Bipolar disorder, current episode depressed, mild: Secondary | ICD-10-CM

## 2023-08-19 ENCOUNTER — Other Ambulatory Visit: Payer: Self-pay

## 2023-08-19 ENCOUNTER — Telehealth: Payer: Self-pay | Admitting: Psychiatry

## 2023-08-19 DIAGNOSIS — F3131 Bipolar disorder, current episode depressed, mild: Secondary | ICD-10-CM

## 2023-08-19 DIAGNOSIS — F4010 Social phobia, unspecified: Secondary | ICD-10-CM

## 2023-08-19 DIAGNOSIS — F410 Panic disorder [episodic paroxysmal anxiety] without agoraphobia: Secondary | ICD-10-CM

## 2023-08-19 DIAGNOSIS — F451 Undifferentiated somatoform disorder: Secondary | ICD-10-CM

## 2023-08-19 DIAGNOSIS — G4733 Obstructive sleep apnea (adult) (pediatric): Secondary | ICD-10-CM

## 2023-08-19 NOTE — Telephone Encounter (Signed)
Pt called 12/17 to request refill and make appt.  Beth advised pt can't be seen here anymore, but Shanda Bumps has advised she will send 3 months of scripts to pt until he can find another provider.  He is asking for Xanax and Ambien to be sent to [facility] [address].

## 2023-08-19 NOTE — Telephone Encounter (Signed)
Pended 3 RF of alprazolam and zolpidem.

## 2023-08-20 MED ORDER — ZOLPIDEM TARTRATE 10 MG PO TABS: ORAL_TABLET | ORAL | 2 refills | Status: AC

## 2023-08-20 MED ORDER — ALPRAZOLAM 1 MG PO TABS: ORAL_TABLET | ORAL | 2 refills | Status: AC

## 2023-08-20 MED ORDER — LAMOTRIGINE 150 MG PO TABS: 300.0000 mg | ORAL_TABLET | Freq: Every day | ORAL | 0 refills | Status: AC

## 2023-08-20 MED ORDER — QUETIAPINE FUMARATE 300 MG PO TABS: 300.0000 mg | ORAL_TABLET | Freq: Every day | ORAL | 0 refills | Status: AC

## 2023-08-20 NOTE — Telephone Encounter (Signed)
Pended scripts for Ambien and Alprazolam have been approved. Also sent scripts for 90-day supply of Seroquel and Lamotrigine.

## 2023-10-10 ENCOUNTER — Other Ambulatory Visit: Payer: Self-pay | Admitting: Family Medicine

## 2023-10-10 DIAGNOSIS — R110 Nausea: Secondary | ICD-10-CM

## 2023-12-04 ENCOUNTER — Other Ambulatory Visit: Payer: Self-pay | Admitting: Family Medicine

## 2023-12-04 DIAGNOSIS — R110 Nausea: Secondary | ICD-10-CM

## 2023-12-04 DIAGNOSIS — F3131 Bipolar disorder, current episode depressed, mild: Secondary | ICD-10-CM

## 2023-12-09 DIAGNOSIS — F310 Bipolar disorder, current episode hypomanic: Secondary | ICD-10-CM | POA: Diagnosis not present

## 2023-12-26 ENCOUNTER — Other Ambulatory Visit: Payer: Self-pay | Admitting: Family Medicine

## 2023-12-26 DIAGNOSIS — R110 Nausea: Secondary | ICD-10-CM

## 2023-12-28 ENCOUNTER — Encounter (HOSPITAL_COMMUNITY): Payer: Self-pay

## 2023-12-28 ENCOUNTER — Emergency Department (HOSPITAL_COMMUNITY)
Admission: EM | Admit: 2023-12-28 | Discharge: 2023-12-29 | Disposition: A | Discharge status: Home / Self Care | Attending: Emergency Medicine | Admitting: Emergency Medicine

## 2023-12-28 ENCOUNTER — Other Ambulatory Visit: Payer: Self-pay

## 2023-12-28 DIAGNOSIS — R7309 Other abnormal glucose: Secondary | ICD-10-CM | POA: Insufficient documentation

## 2023-12-28 DIAGNOSIS — F1410 Cocaine abuse, uncomplicated: Secondary | ICD-10-CM | POA: Insufficient documentation

## 2023-12-28 DIAGNOSIS — R9431 Abnormal electrocardiogram [ECG] [EKG]: Secondary | ICD-10-CM | POA: Diagnosis not present

## 2023-12-28 DIAGNOSIS — F319 Bipolar disorder, unspecified: Secondary | ICD-10-CM | POA: Diagnosis not present

## 2023-12-28 DIAGNOSIS — F22 Delusional disorders: Secondary | ICD-10-CM | POA: Diagnosis not present

## 2023-12-28 DIAGNOSIS — R451 Restlessness and agitation: Secondary | ICD-10-CM | POA: Insufficient documentation

## 2023-12-28 LAB — CBC WITH DIFFERENTIAL/PLATELET
Abs Immature Granulocytes: 0.02 10*3/uL (ref 0.00–0.07)
Basophils Absolute: 0.1 10*3/uL (ref 0.0–0.1)
Basophils Relative: 1 %
Eosinophils Absolute: 0.5 10*3/uL (ref 0.0–0.5)
Eosinophils Relative: 7 %
HCT: 42.8 % (ref 39.0–52.0)
Hemoglobin: 14.4 g/dL (ref 13.0–17.0)
Immature Granulocytes: 0 %
Lymphocytes Relative: 25 %
Lymphs Abs: 1.8 10*3/uL (ref 0.7–4.0)
MCH: 28.3 pg (ref 26.0–34.0)
MCHC: 33.6 g/dL (ref 30.0–36.0)
MCV: 84.1 fL (ref 80.0–100.0)
Monocytes Absolute: 0.8 10*3/uL (ref 0.1–1.0)
Monocytes Relative: 11 %
Neutro Abs: 4 10*3/uL (ref 1.7–7.7)
Neutrophils Relative %: 56 %
Platelets: 291 10*3/uL (ref 150–400)
RBC: 5.09 MIL/uL (ref 4.22–5.81)
RDW: 13.9 % (ref 11.5–15.5)
WBC: 7.2 10*3/uL (ref 4.0–10.5)
nRBC: 0 % (ref 0.0–0.2)

## 2023-12-28 LAB — CBG MONITORING, ED: Glucose-Capillary: 118 mg/dL — ABNORMAL HIGH (ref 70–99)

## 2023-12-28 LAB — COMPREHENSIVE METABOLIC PANEL WITH GFR
ALT: 13 U/L (ref 0–44)
AST: 15 U/L (ref 15–41)
Albumin: 3.9 g/dL (ref 3.5–5.0)
Alkaline Phosphatase: 90 U/L (ref 38–126)
Anion gap: 12 (ref 5–15)
BUN: 15 mg/dL (ref 6–20)
CO2: 20 mmol/L — ABNORMAL LOW (ref 22–32)
Calcium: 8.8 mg/dL — ABNORMAL LOW (ref 8.9–10.3)
Chloride: 106 mmol/L (ref 98–111)
Creatinine, Ser: 0.6 mg/dL — ABNORMAL LOW (ref 0.61–1.24)
GFR, Estimated: >60 mL/min (ref >60)
Glucose, Bld: 113 mg/dL — ABNORMAL HIGH (ref 70–99)
Potassium: 3.5 mmol/L (ref 3.5–5.1)
Sodium: 138 mmol/L (ref 135–145)
Total Bilirubin: 0.7 mg/dL (ref 0.0–1.2)
Total Protein: 7.8 g/dL (ref 6.5–8.1)

## 2023-12-28 LAB — RAPID URINE DRUG SCREEN, HOSP PERFORMED
Amphetamines: NOT DETECTED
Barbiturates: NOT DETECTED
Benzodiazepines: POSITIVE — AB
Cocaine: POSITIVE — AB
Opiates: NOT DETECTED
Tetrahydrocannabinol: NOT DETECTED

## 2023-12-28 LAB — ETHANOL: Alcohol, Ethyl (B): <15 mg/dL (ref <15)

## 2023-12-28 LAB — TSH: TSH: 1.275 u[IU]/mL (ref 0.350–4.500)

## 2023-12-28 MED ORDER — LAMOTRIGINE 100 MG PO TABS
300.0000 mg | ORAL_TABLET | Freq: Every day | ORAL | Status: DC
  Administered 2023-12-29: 300 mg via ORAL
  Filled 2023-12-28: qty 3

## 2023-12-28 MED ORDER — ACETAMINOPHEN 325 MG PO TABS: 650.0000 mg | ORAL_TABLET | ORAL | Status: DC | PRN

## 2023-12-28 MED ORDER — LORAZEPAM 1 MG PO TABS: 1.0000 mg | ORAL_TABLET | ORAL | Status: DC | PRN

## 2023-12-28 MED ORDER — LINACLOTIDE 145 MCG PO CAPS
290.0000 ug | ORAL_CAPSULE | Freq: Every day | ORAL | Status: DC
  Filled 2023-12-28: qty 2

## 2023-12-28 MED ORDER — FLUTICASONE PROPIONATE 50 MCG/ACT NA SUSP
1.0000 | Freq: Every day | NASAL | Status: DC
  Administered 2023-12-28: 1 via NASAL
  Filled 2023-12-28: qty 16

## 2023-12-28 MED ORDER — ZOLPIDEM TARTRATE 5 MG PO TABS: 10.0000 mg | ORAL_TABLET | Freq: Every evening | ORAL | Status: DC | PRN

## 2023-12-28 MED ORDER — ZOLPIDEM TARTRATE 5 MG PO TABS: 5.0000 mg | ORAL_TABLET | Freq: Every evening | ORAL | Status: DC | PRN

## 2023-12-28 MED ORDER — ZIPRASIDONE MESYLATE 20 MG IM SOLR: 20.0000 mg | INTRAMUSCULAR | Status: DC | PRN

## 2023-12-28 MED ORDER — ALPRAZOLAM 0.5 MG PO TABS
1.0000 mg | ORAL_TABLET | Freq: Three times a day (TID) | ORAL | Status: DC | PRN
Start: 2023-12-28 — End: 2023-12-29

## 2023-12-28 MED ORDER — ONDANSETRON HCL 4 MG PO TABS: 4.0000 mg | ORAL_TABLET | Freq: Three times a day (TID) | ORAL | Status: DC | PRN

## 2023-12-28 MED ORDER — RISPERIDONE 0.5 MG PO TBDP: 2.0000 mg | ORAL_TABLET | Freq: Three times a day (TID) | ORAL | Status: DC | PRN

## 2023-12-28 MED ORDER — NICOTINE 21 MG/24HR TD PT24
21.0000 mg | MEDICATED_PATCH | Freq: Every day | TRANSDERMAL | Status: DC
  Filled 2023-12-28: qty 1

## 2023-12-28 MED ORDER — FLUTICASONE FUROATE-VILANTEROL 100-25 MCG/ACT IN AEPB
1.0000 | INHALATION_SPRAY | Freq: Every day | RESPIRATORY_TRACT | Status: DC
  Administered 2023-12-28: 1 via RESPIRATORY_TRACT
  Filled 2023-12-28: qty 28

## 2023-12-28 MED ORDER — ALPRAZOLAM 0.5 MG PO TABS
1.0000 mg | ORAL_TABLET | Freq: Once | ORAL | Status: AC
  Administered 2023-12-28: 1 mg via ORAL
  Filled 2023-12-28: qty 2

## 2023-12-28 MED ORDER — QUETIAPINE FUMARATE 100 MG PO TABS
300.0000 mg | ORAL_TABLET | Freq: Every day | ORAL | Status: DC
Start: 2023-12-28 — End: 2023-12-29
  Administered 2023-12-29: 300 mg via ORAL
  Filled 2023-12-28: qty 3

## 2023-12-28 NOTE — Consult Note (Cosign Needed Addendum)
 Attempted to evaluate patient for psychiatric assessment accompanied by [employer]ta [employer] coordinator. Pt was sedated, and would not wake up for assessment. Attempted to physically wake patient numerous times, he remained asleep and sedated. Pt was unable to be assessed at this time.   TTS will need ordered lab work including: UDS, CBC w diff, CMP, Ethanol, and TSH prior to assessment.

## 2023-12-28 NOTE — ED Provider Notes (Signed)
 [facility] AT [facility], [place] Provider Note   CSN: [id number] Arrival date & time: 12/28/23  0900     History  Chief Complaint  Patient presents with   Psychiatric Evaluation    Sean Wu is a 53 y.o. male.  HPI    53 year old male comes in with chief complaint of psychiatric evaluation.  Patient has history of gastroparesis, bipolar disorder, social anxiety disorder, alcohol use disorder and he admits to also using cocaine.  Patient is disorganized during my assessment.  He starts out by indicating that he is tired of listening to the secondary speaking.  While dizzy have to be sitting in front of the secretary's desk.  Upon redirection, patient indicates that his wife and he had some verbal altercation and that is why he is in the ER.  He is not able to give me details on the altercation.  Patient admits to cocaine use yesterday.  Per nursing staff, when EMS arrived with a patient in adjacent room, patient got up to help EMS and had to be redirected back to his bed.  Patient was fixated on everything going around him, and why he is subjected to having to listen to that.  Patient denies any SI, HI. Patient came in privately. I called patient's wife, but there was no response.  Voice message was not left. Home Medications Prior to Admission medications   Medication Sig Start Date End Date Taking? Authorizing Provider  albuterol  (VENTOLIN  HFA) 108 (90 Base) MCG/ACT inhaler TAKE 2 PUFFS BY MOUTH EVERY 6 HOURS AS NEEDED FOR WHEEZE OR SHORTNESS OF BREATH 07/20/23   [place], Betty G, MD  ALPRAZolam  (XANAX ) 1 MG tablet TAKE 1 TABLET BY MOUTH 3 TIMES A DAY AS NEEDED FOR ANXIETY,PANIC ATTACKS OR AGITATION 08/20/23   Roselyn Connor, PMHNP  budesonide -formoterol  (SYMBICORT ) 80-4.5 MCG/ACT inhaler INHALE 2 PUFFS INTO THE LUNGS TWICE A DAY 02/03/23   [place], Betty G, MD  fluticasone  (FLONASE ) 50 MCG/ACT nasal spray Place 1 spray into both nostrils daily.  02/13/23   Aleck Hurdle, MD  lamoTRIgine  (LAMICTAL ) 150 MG tablet Take 2 tablets (300 mg total) by mouth at bedtime. 08/20/23 11/18/23  Roselyn Connor, PMHNP  linaclotide  (LINZESS ) 290 MCG CAPS capsule Take 1 capsule (290 mcg total) by mouth daily before breakfast. 12/31/22   Arlee Bellows, NP  metoCLOPramide  (REGLAN ) 5 MG tablet TAKE 1 TABLET BY MOUTH 3 TIMES DAILY BEFORE MEALS. 04/22/21   [place], Betty G, MD  prochlorperazine  (COMPAZINE ) 10 MG tablet TAKE 1/2 TO 1 TABLET BY MOUTH DAILY AS NEEDED 12/28/23   [place], Betty G, MD  QUEtiapine  (SEROQUEL ) 300 MG tablet Take 1 tablet (300 mg total) by mouth at bedtime. 08/20/23   Roselyn Connor, PMHNP  zolpidem  (AMBIEN ) 10 MG tablet TAKE 1 TABLET BY MOUTH EVERYDAY AT BEDTIME 08/20/23   Roselyn Connor, PMHNP  omeprazole  (PRILOSEC  OTC) 20 MG tablet 1 tablet twice daily. Do not eat for one hour after ingestion of the medication 01/10/11 01/19/12  Hillery Lown, MD      Allergies    Topamax [topiramate]    Review of Systems   Review of Systems  All other systems reviewed and are negative.   Physical Exam Updated Vital Signs BP 138/88 (BP Location: Left Arm)   Pulse (!) 109   Temp 97.6 F (36.4 C) (Oral)   Resp 18   SpO2 97%  Physical Exam Vitals and nursing note reviewed.  Constitutional:      Appearance:  He is well-developed.  HENT:     Head: Atraumatic.  Cardiovascular:     Rate and Rhythm: Normal rate.  Pulmonary:     Effort: Pulmonary effort is normal.  Musculoskeletal:     Cervical back: Neck supple.  Skin:    General: Skin is warm.  Neurological:     Mental Status: He is alert and oriented to person, place, and time.  Psychiatric:        Attention and Perception: He is inattentive.        Mood and Affect: Mood is anxious.        Behavior: Behavior is agitated.        Thought Content: Thought content is paranoid.        Judgment: Judgment is impulsive.     ED Results / Procedures / Treatments   Labs (all labs  ordered are listed, but only abnormal results are displayed) Labs Reviewed  COMPREHENSIVE METABOLIC PANEL WITH GFR  ETHANOL  RAPID URINE DRUG SCREEN, HOSP PERFORMED  CBC WITH DIFFERENTIAL/PLATELET    EKG None  Radiology No results found.  Procedures Procedures    Medications Ordered in ED Medications  risperiDONE (RISPERDAL M-TABS) disintegrating tablet 2 mg (has no administration in time range)    And  ziprasidone (GEODON) injection 20 mg (has no administration in time range)  acetaminophen  (TYLENOL ) tablet 650 mg (has no administration in time range)  nicotine (NICODERM CQ - dosed in mg/24 hours) patch 21 mg (has no administration in time range)  ondansetron  (ZOFRAN ) tablet 4 mg (has no administration in time range)  ALPRAZolam  (XANAX ) tablet 1 mg (has no administration in time range)  fluticasone  furoate-vilanterol (BREO ELLIPTA) 100-25 MCG/ACT 1 puff (has no administration in time range)  fluticasone  (FLONASE ) 50 MCG/ACT nasal spray 1 spray (has no administration in time range)  lamoTRIgine  (LAMICTAL ) tablet 300 mg (has no administration in time range)  linaclotide  (LINZESS ) capsule 290 mcg (has no administration in time range)  QUEtiapine  (SEROQUEL ) tablet 300 mg (has no administration in time range)  zolpidem  (AMBIEN ) tablet 10 mg (has no administration in time range)  ALPRAZolam  (XANAX ) tablet 1 mg (1 mg Oral Given 12/28/23 1007)    ED Course/ Medical Decision Making/ A&P                                 Medical Decision Making Amount and/or Complexity of Data Reviewed Labs: ordered.  Risk OTC drugs. Prescription drug management.   Patient comes to the ER with cc of agitation. He tells us  that he wants to speak with psychiatrist. Patient has pertinent past medical history of -bipolar disorder, anxiety disorder, depression, panic attacks and cocaine use disorder. Currently patient is slightly agitated, but directable.  He sounds slightly paranoid.  I am unable  to get in touch with his wife.  Patient at high risk for psychiatric decompensation.  Pt denies nausea, emesis, fevers, chills, chest pains, shortness of breath, headaches, abdominal pain, uti like symptoms and patient has no active medical complaints. I have reviewed previous encounters for this patient and reviewed their primary medications.  Differential diagnosis considered for this patient includes: Depression Bipolar disorder Schizophrenia Substance abuse Suicidal ideation Acute withdrawal  Appropriate labs have been ordered.  UDS ordered.  I doubt that this is primary medical issue. Patient is medically cleared for psychiatric evaluation.    Final Clinical Impression(s) / ED Diagnoses Final diagnoses:  Agitation  Paranoid (  HCC)  Cocaine abuse [facility])    Rx / DC Orders ED Discharge Orders     None         Deatra Face, MD 12/28/23 1019

## 2023-12-28 NOTE — ED Notes (Signed)
 Pt ambulated to restroom in an attempt to obtain a urine specimen, however pt exhibiting confused behavior. Pt thought the trash can was a faucet and was trying to fill the urine cup up. Then pt thought he was drinking out of the empty cup. Unable to obtain specimen at this time.

## 2023-12-28 NOTE — ED Notes (Signed)
 Pt is A/Ox4 at this time and is acting appropriately.
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
## 2023-12-28 NOTE — ED Notes (Signed)
 Pt was awake and talking to his wife, but as soon as she left he went back to sleep. Even, unlabored resp noted.

## 2023-12-28 NOTE — ED Triage Notes (Signed)
 Patient arrived reporting he wants a psych eval. States him and wife keep getting into verbal altercation. Denies Hi/Si. Reports cocaine use. Denies any other drug. Patient calm and cooperative in triage. States wants to speak with doctor.

## 2023-12-28 NOTE — ED Notes (Signed)
 [employer] key fob removed from pt's belongings and given to his wife Jaivin Silerio.

## 2023-12-29 ENCOUNTER — Encounter: Payer: Self-pay | Admitting: Nurse Practitioner

## 2023-12-29 ENCOUNTER — Inpatient Hospital Stay
Admission: AD | Admit: 2023-12-29 | Discharge: 2023-12-31 | DRG: 885 | Disposition: A | Discharge status: Home / Self Care | Source: Intra-hospital | Attending: Psychiatry | Admitting: Psychiatry

## 2023-12-29 DIAGNOSIS — Z8249 Family history of ischemic heart disease and other diseases of the circulatory system: Secondary | ICD-10-CM | POA: Diagnosis not present

## 2023-12-29 DIAGNOSIS — K219 Gastro-esophageal reflux disease without esophagitis: Secondary | ICD-10-CM | POA: Diagnosis present

## 2023-12-29 DIAGNOSIS — Z818 Family history of other mental and behavioral disorders: Secondary | ICD-10-CM

## 2023-12-29 DIAGNOSIS — F1994 Other psychoactive substance use, unspecified with psychoactive substance-induced mood disorder: Secondary | ICD-10-CM | POA: Diagnosis not present

## 2023-12-29 DIAGNOSIS — F4010 Social phobia, unspecified: Secondary | ICD-10-CM | POA: Diagnosis present

## 2023-12-29 DIAGNOSIS — R7309 Other abnormal glucose: Secondary | ICD-10-CM | POA: Diagnosis not present

## 2023-12-29 DIAGNOSIS — Z79899 Other long term (current) drug therapy: Secondary | ICD-10-CM

## 2023-12-29 DIAGNOSIS — Z7951 Long term (current) use of inhaled steroids: Secondary | ICD-10-CM | POA: Diagnosis not present

## 2023-12-29 DIAGNOSIS — F319 Bipolar disorder, unspecified: Principal | ICD-10-CM | POA: Diagnosis present

## 2023-12-29 DIAGNOSIS — F3162 Bipolar disorder, current episode mixed, moderate: Principal | ICD-10-CM | POA: Diagnosis present

## 2023-12-29 DIAGNOSIS — G4733 Obstructive sleep apnea (adult) (pediatric): Secondary | ICD-10-CM | POA: Diagnosis present

## 2023-12-29 DIAGNOSIS — F1414 Cocaine abuse with cocaine-induced mood disorder: Secondary | ICD-10-CM | POA: Diagnosis not present

## 2023-12-29 DIAGNOSIS — Z811 Family history of alcohol abuse and dependence: Secondary | ICD-10-CM | POA: Diagnosis not present

## 2023-12-29 DIAGNOSIS — F22 Delusional disorders: Secondary | ICD-10-CM | POA: Diagnosis not present

## 2023-12-29 DIAGNOSIS — F1410 Cocaine abuse, uncomplicated: Secondary | ICD-10-CM | POA: Diagnosis not present

## 2023-12-29 DIAGNOSIS — F17210 Nicotine dependence, cigarettes, uncomplicated: Secondary | ICD-10-CM | POA: Diagnosis not present

## 2023-12-29 DIAGNOSIS — R451 Restlessness and agitation: Secondary | ICD-10-CM | POA: Diagnosis not present

## 2023-12-29 MED ORDER — MAGNESIUM HYDROXIDE 400 MG/5ML PO SUSP: 30.0000 mL | Freq: Every day | ORAL | Status: DC | PRN

## 2023-12-29 MED ORDER — DIPHENHYDRAMINE HCL 50 MG/ML IJ SOLN
50.0000 mg | Freq: Three times a day (TID) | INTRAMUSCULAR | Status: DC | PRN
Start: 2023-12-29 — End: 2023-12-31

## 2023-12-29 MED ORDER — ACETAMINOPHEN 325 MG PO TABS
650.0000 mg | ORAL_TABLET | Freq: Four times a day (QID) | ORAL | Status: DC | PRN
Start: 2023-12-29 — End: 2023-12-31

## 2023-12-29 MED ORDER — HALOPERIDOL 5 MG PO TABS: 5.0000 mg | ORAL_TABLET | Freq: Three times a day (TID) | ORAL | Status: DC | PRN

## 2023-12-29 MED ORDER — DIPHENHYDRAMINE HCL 25 MG PO CAPS: 50.0000 mg | ORAL_CAPSULE | Freq: Three times a day (TID) | ORAL | Status: DC | PRN

## 2023-12-29 MED ORDER — QUETIAPINE FUMARATE 200 MG PO TABS
200.0000 mg | ORAL_TABLET | Freq: Every day | ORAL | Status: DC
  Administered 2023-12-29 – 2023-12-30 (×2): 200 mg via ORAL
  Filled 2023-12-29 (×2): qty 1

## 2023-12-29 MED ORDER — ALUM & MAG HYDROXIDE-SIMETH 200-200-20 MG/5ML PO SUSP: 30.0000 mL | ORAL | Status: DC | PRN

## 2023-12-29 MED ORDER — HALOPERIDOL LACTATE 5 MG/ML IJ SOLN: 5.0000 mg | Freq: Three times a day (TID) | INTRAMUSCULAR | Status: DC | PRN

## 2023-12-29 MED ORDER — HALOPERIDOL LACTATE 5 MG/ML IJ SOLN: 10.0000 mg | Freq: Three times a day (TID) | INTRAMUSCULAR | Status: DC | PRN

## 2023-12-29 MED ORDER — LORAZEPAM 2 MG/ML IJ SOLN: 2.0000 mg | Freq: Three times a day (TID) | INTRAMUSCULAR | Status: DC | PRN

## 2023-12-29 MED ORDER — PANTOPRAZOLE SODIUM 40 MG PO TBEC
40.0000 mg | DELAYED_RELEASE_TABLET | Freq: Every day | ORAL | Status: DC
  Administered 2023-12-29 – 2023-12-31 (×3): 40 mg via ORAL
  Filled 2023-12-29 (×3): qty 1

## 2023-12-29 MED ORDER — DIPHENHYDRAMINE HCL 50 MG/ML IJ SOLN: 50.0000 mg | Freq: Three times a day (TID) | INTRAMUSCULAR | Status: DC | PRN

## 2023-12-29 MED ORDER — LAMOTRIGINE 25 MG PO TABS
150.0000 mg | ORAL_TABLET | Freq: Every day | ORAL | Status: DC
  Administered 2023-12-29 – 2023-12-30 (×2): 150 mg via ORAL
  Filled 2023-12-29 (×2): qty 2

## 2023-12-29 NOTE — Progress Notes (Addendum)
 Pt became upset earlier in the day, demanding to leave because he was a voluntary patient. RN explain the process and provider contacted and discuss concern with the patient and he became calm and amendable to the decision and without  further complaints       12/29/23 1015  Psych Admission Type (Psych Patients Only)  Admission Status Voluntary  Psychosocial Assessment  Patient Complaints Anxiety  Eye Contact Fair  Facial Expression Animated  Affect Sad  Speech Logical/coherent  Interaction Assertive  Motor Activity Slow  Appearance/Hygiene Unremarkable  Behavior Characteristics Cooperative;Restless  Mood Labile  Thought Process  Coherency WDL  Content WDL  Delusions WDL  Perception WDL  Hallucination None reported or observed  Judgment Impaired  Confusion None  Danger to Self  Current suicidal ideation? Denies  Agreement Not to Harm Self Yes  Description of Agreement verbal
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
## 2023-12-29 NOTE — Tx Team (Signed)
 Initial Treatment Plan 12/29/2023 3:38 AM Sean Wu VWU:[id number]    PATIENT STRESSORS: Marital or family conflict   Substance abuse     PATIENT STRENGTHS: Capable of independent living  Communication skills    PATIENT IDENTIFIED PROBLEMS: Substance Abuse  Depressopm                   DISCHARGE CRITERIA:  Improved stabilization in mood, thinking, and/or behavior  PRELIMINARY DISCHARGE PLAN: Outpatient therapy  PATIENT/FAMILY INVOLVEMENT: This treatment plan has been presented to and reviewed with the patient, Sean Wu, and/or family member,  .  The patient and family have been given the opportunity to ask questions and make suggestions.  Rosann Commander, RN 12/29/2023, 3:38 AM

## 2023-12-29 NOTE — Group Note (Signed)
 Date:  12/29/2023 Time:  9:19 PM  Group Topic/Focus:  Wrap-Up Group:   The focus of this group is to help patients review their daily goal of treatment and discuss progress on daily workbooks.    Participation Level:  Active  Participation Quality:  Appropriate  Affect:  Appropriate  Cognitive:  Appropriate  Insight: Appropriate  Engagement in Group:  Engaged  Modes of Intervention:  Discussion   Bradley Caffey 12/29/2023, 9:19 PM

## 2023-12-29 NOTE — Plan of Care (Signed)
  Problem: Education: Goal: Knowledge of [facility] Education information/materials will improve Outcome: Not Met (add Reason) Goal: Emotional status will improve Outcome: Not Met (add Reason) Goal: Mental status will improve Outcome: Progressing Goal: Verbalization of understanding the information provided will improve Outcome: Progressing   Problem: Activity: Goal: Interest or engagement in activities will improve Outcome: Progressing Goal: Sleeping patterns will improve Outcome: Not Met (add Reason)   Problem: Coping: Goal: Ability to verbalize frustrations and anger appropriately will improve Outcome: Progressing   Problem: Activity: Goal: Interest or engagement in activities will improve Outcome: Progressing   Problem: Safety: Goal: Periods of time without injury will increase Outcome: Progressing   Problem: Education: Goal: Knowledge of disease or condition will improve Outcome: Progressing

## 2023-12-29 NOTE — Group Note (Signed)
 Recreation Therapy Group Note   Group Topic:Relaxation  Group Date: 12/29/2023 Start Time: 1000 End Time: 1050 Facilitators: Deatrice Factor, LRT, CTRS Location:  Craft Room  Group Description: PMR (Progressive Muscle Relaxation). LRT asks patients their current level of stress/anxiety from 1-10, with 10 being the highest. LRT educates patients on what PMR is and the benefits that come from it. Patients are asked to sit with their feet flat on the floor while sitting up and all the way back in their chair, if possible. LRT and pts follow a prompt through a speaker that requires you to tense and release different muscles in their body and focus on their breathing. During session, lights are off and soft music is being played. Pts are given a stress ball to use if needed. At the end of the prompt, LRT asks patients to rank their current levels of stress/anxiety from 1-10, 10 being the highest. LRT provides patients with an education handout on PMR.   Goal Area(s) Addressed:  Patients will be able to describe progressive muscle relaxation.  Patient will practice using relaxation technique. Patient will identify a new coping skill.  Patient will follow multistep directions to reduce anxiety and stress.   Affect/Mood: N/A   Participation Level: Did not attend    Clinical Observations/Individualized Feedback: Patient did not attend group.   Plan: Continue to engage patient in RT group sessions 2-3x/week.   Deatrice Factor, LRT, CTRS 12/29/2023 12:51 PM

## 2023-12-29 NOTE — Group Note (Signed)
 Date:  12/29/2023 Time:  3:25 PM  Group Topic/Focus:  Wellness Toolbox:   The focus of this group is to discuss various aspects of wellness, balancing those aspects and exploring ways to increase the ability to experience wellness.  Patients will create a wellness toolbox for use upon discharge.    Participation Level:  Did Not Attend   Mellie Sprinkle [facility] 12/29/2023, 3:25 PM

## 2023-12-29 NOTE — Plan of Care (Signed)
   Problem: Safety: Goal: Periods of time without injury will increase Outcome: Progressing

## 2023-12-29 NOTE — Group Note (Signed)
 LCSW Group Therapy Note  Group Date: 12/29/2023 Start Time: 1300 End Time: 1400   Type of Therapy and Topic:  Group Therapy - Healthy vs Unhealthy Coping Skills  Participation Level:  Did Not Attend   Description of Group The focus of this group was to determine what unhealthy coping techniques typically are used by group members and what healthy coping techniques would be helpful in coping with various problems. Patients were guided in becoming aware of the differences between healthy and unhealthy coping techniques. Patients were asked to identify 2-3 healthy coping skills they would like to learn to use more effectively.  Therapeutic Goals Patients learned that coping is what human beings do all day long to deal with various situations in their lives Patients defined and discussed healthy vs unhealthy coping techniques Patients identified their preferred coping techniques and identified whether these were healthy or unhealthy Patients determined 2-3 healthy coping skills they would like to become more familiar with and use more often. Patients provided support and ideas to each other   Summary of Patient Progress: Patient did not attend.    Therapeutic Modalities Cognitive Behavioral Therapy Motivational Interviewing  Valeen Gartner 12/29/2023  1:41 PM

## 2023-12-29 NOTE — H&P (Signed)
 Psychiatric Admission Assessment Adult  Patient Identification: Sean Wu MRN:  [id number] Date of Evaluation:  12/29/2023 Chief Complaint:  Bipolar affective disorder (HCC) [F31.9] Principal Diagnosis: Bipolar affective disorder (HCC) Diagnosis:  Principal Problem:   Bipolar affective disorder (HCC)  History of Present Illness:  TTS note: Pt's wife felt he needed an evaluation. He drove himself to [facility]. Pt says that he has had drug use and he has been having things going on in his life. Patient denies any SI feeling and last attempt was decades ago. No HI. Patient says that his wife told him that he had been responding to voices not present. Patient says that he was respnding to her voice but she was not talking to her. Patient has been using cocaine (powder) and last use was last night. Patient says that the cocaine use has "gotten out of control." Patient denies use of other substances. Pt reports depression and anxiety. He has mostly social anxiety. Pt has a psychiatrist, Dr. Dolan Freiberg, who is thorugh telehealth. Patient does not feel he needs any inpatient psychiatric care. He has not been any drug treatment centers in the past. Patient says that he used to  live a healthy lifestyle and he has lost that about a year ago when he started using cocaine regularly.    Assessment: Patient is evaluated face-to-face by this provider and chart/nursing notes reviewed. Sean Wu is a 53 year-old male sitting in his bed. He is somewhat guarded upon approach. He appears disheveled, restless, anxious. He is alert and oriented x 4. He does not appear to be responding to internal stimuli. His thought process is coherent, organized and goal-directed. Patient states "they brought me here, I don;t know why...but I know I am Bipolar". Patient reports that he has had Bipolar since his younger age. Reports he used to self-cut when he was in his teens but no recent episode. Patient reports that he takes his prescribed  medications. He reports that he has been using Cocaine and this is the origine of conflicts with his wife but minimizes the conflicts "we just argue..". He denies SI/HI/AVH. Denies hx of abuse. He shares that he lost his mother about 6 months ago (and cries) and says he was her primary caregiver. Patient admits to feeling depressed but not to the point of killing himself.  Patient states he is wanting to stop using cocaine because "its ruining my family". He denies using any other drugs.  He reports that he has a therapist that his wife found for him (Dr Dolan Freiberg).  Patient denies current medical acuity.  He reports that he had called his wife to come and take him home but changes his mind after being educated about treatment. Patient denies recent inpatient but reports he was hospitalized at age 54 "I was doing stupid things, was cutting my arm".   Patient appears anxious and restless. He reports he has been experiencing impulsiveness which led to increased substance use.  He expresses remorse and states he want to stop using. He is motivated for medications for his mental illness.    Associated Signs/Symptoms: Depression Symptoms:  depressed mood, difficulty concentrating, anxiety, (Hypo) Manic Symptoms:  Distractibility, Financial Extravagance, Irritable Mood, Anxiety Symptoms:  Obsessive Compulsive Symptoms:   None, Psychotic Symptoms:   NA PTSD Symptoms: NA Total Time spent with patient: 1 hour  Past Psychiatric History: Bipolar, Depression, anxiety  Is the patient at risk to self? No.  Has the patient been a risk to self in the  past 6 months? No.  Has the patient been a risk to self within the distant past? Yes.    Is the patient a risk to others? No.  Has the patient been a risk to others in the past 6 months? No.  Has the patient been a risk to others within the distant past? No.   [place] Scale:  Flowsheet Row Admission (Current) from 12/29/2023 in [facility] INPATIENT BEH[facility]IORAL MEDICINE  ED from 12/28/2023 in [facility] Emergency Department at [facility] ED from 08/28/2021 in [facility] Emergency Department at [facility]  C-SSRS RISK CATEGORY No Risk No Risk No Risk        Prior Inpatient Therapy: Yes.   If yes, describe: Was hospitalized at age 38 due to suicidal thoughts and behaviors  Prior Outpatient Therapy: Yes.   If yes, describe: Currently sees Dr Dolan Freiberg online services   Alcohol Screening: 1. How often do you have a drink containing alcohol?: Never 2. How many drinks containing alcohol do you have on a typical day when you are drinking?: 1 or 2 3. How often do you have six or more drinks on one occasion?: Never AUDIT-C Score: 0 4. How often during the last year have you found that you were not able to stop drinking once you had started?: Never 5. How often during the last year have you failed to do what was normally expected from you because of drinking?: Never 6. How often during the last year have you needed a first drink in the morning to get yourself going after a heavy drinking session?: Never 7. How often during the last year have you had a feeling of guilt of remorse after drinking?: Never 8. How often during the last year have you been unable to remember what happened the night before because you had been drinking?: Never 9. Have you or someone else been injured as a result of your drinking?: No 10. Has a relative or friend or a doctor or another health worker been concerned about your drinking or suggested you cut down?: No Alcohol Use Disorder Identification Test Final Score (AUDIT): 0 Substance Abuse History in the last 12 months:  Yes.   Consequences of Substance Abuse: Family Consequences:  Spending excessive amount of  family money on drugs Previous Psychotropic Medications: Yes  Psychological Evaluations:  Unsure Past Medical History:  Past Medical History:  Diagnosis Date   ALLERGIC RHINITIS 05/10/2009   Allergy    anemia    Anxiety    Bipolar disorder (HCC)    see medications patient to bring   Chronic headaches    DDD (degenerative disc disease)    DEPRESSION 05/10/2009   Gastroparesis    GERD 05/10/2009   LIBIDO, DECREASED 05/10/2009   Neuromuscular disorder (HCC)    pt. denies 08/01/16   Osgood-Schlatter's disease    SLEEP APNEA, OBSTRUCTIVE 06/12/2009   CPAP not utilized after 30 lb. weight loss   SMOKER 06/12/2009   TESTICULAR HYPOFUNCTION 06/12/2009   WEIGHT GAIN 05/10/2009    Past Surgical History:  Procedure Laterality Date   ANKLE SURGERY Left    COLONOSCOPY  2014   normal   EYE SURGERY     eye implant   FRACTURE SURGERY     surgery on right ankle for ligament torn 2013   LASIK Bilateral    SHOULDER ARTHROSCOPY WITH DISTAL CLAVICLE RESECTION Right 02/26/2015   Procedure: SHOULDER DIAGNOSTIC ARTHROSCOPY WITH OPEN DISTAL CLAVICLE EXCISION;  Surgeon: Sammye Cristal, MD;  Location:  [facility];  Service: Orthopedics;  Laterality: Right;  Right diagnostic arthroscopy, open distal clavical excision   Family History:  Family History  Problem Relation Age of Onset   Irritable bowel syndrome Father    Alcoholism Father    Heart attack Father    Bipolar disorder Father    Heart disease Maternal Grandfather    Colon cancer Neg Hx    Rectal cancer Neg Hx    Stomach cancer Neg Hx    Lung disease Neg Hx    Family Psychiatric  History: NA Tobacco Screening:  Social History   Tobacco Use  Smoking Status Some Days   Current packs/day: 0.50   Average packs/day: 1.7 packs/day for 30.3 years (52.7 ttl pk-yrs)   Types: Cigarettes   Start date: 1995  Smokeless Tobacco Former   Types: Chew   Quit date: 09/01/1998  Tobacco Comments   tobacco info given 08/01/13.   Smokes 1 ppd.  02/13/23 hfb    BH Tobacco Counseling     Are you interested in Tobacco Cessation Medications?  No, patient refused Counseled patient on smoking cessation:  Refused/Declined practical counseling Reason Tobacco  Screening Not Completed: No value filed.       Social History:  Social History   Substance and Sexual Activity  Alcohol Use Not Currently   Alcohol/week: 1.0 standard drink of alcohol   Types: 1 Cans of beer per week   Comment: social, rare     Social History   Substance and Sexual Activity  Drug Use No    Additional Social History: Marital status: Married Number of Years Married: 16 What types of issues is patient dealing with in the relationship?: "arguing about drugs" Does patient have children?: No                         Allergies:   Allergies  Allergen Reactions   Topamax [Topiramate]     Sleep walking, hallucinations, "I get mean, can't help it"   Lab Results:  Results for orders placed or performed during the hospital encounter of 12/28/23 (from the past 48 hours)  Comprehensive metabolic panel     Status: Abnormal   Collection Time: 12/28/23  4:06 PM  Result Value Ref Range   Sodium 138 135 - 145 mmol/L   Potassium 3.5 3.5 - 5.1 mmol/L   Chloride 106 98 - 111 mmol/L   CO2 20 (L) 22 - 32 mmol/L   Glucose, Bld 113 (H) 70 - 99 mg/dL    Comment: Glucose reference range applies only to samples taken after fasting for at least 8 hours.   BUN 15 6 - 20 mg/dL   Creatinine, Ser 4.09 (L) 0.61 - 1.24 mg/dL   Calcium  8.8 (L) 8.9 - 10.3 mg/dL   Total Protein 7.8 6.5 - 8.1 g/dL   Albumin 3.9 3.5 - 5.0 g/dL   AST 15 15 - 41 U/L   ALT 13 0 - 44 U/L   Alkaline Phosphatase 90 38 - 126 U/L   Total Bilirubin 0.7 0.0 - 1.2 mg/dL   GFR, Estimated >81 >19 mL/min    Comment: (NOTE) Calculated using the CKD-EPI Creatinine Equation (2021)    Anion gap 12 5 - 15    Comment: Performed at [facility], [address], [place], [place] [address]  Ethanol     Status: None   Collection Time: 12/28/23  4:06 PM  Result Value Ref Range  Alcohol, Ethyl (B) <15 <15 mg/dL    Comment: Please note change in reference range. (NOTE) For medical purposes  only. Performed at [facility], [address], [place], [place] [address]   CBC with Diff     Status: None   Collection Time: 12/28/23  4:06 PM  Result Value Ref Range   WBC 7.2 4.0 - 10.5 K/uL   RBC 5.09 4.22 - 5.81 MIL/uL   Hemoglobin 14.4 13.0 - 17.0 g/dL   HCT 60.4 54.0 - 98.1 %   MCV 84.1 80.0 - 100.0 fL   MCH 28.3 26.0 - 34.0 pg   MCHC 33.6 30.0 - 36.0 g/dL   RDW 19.1 47.8 - 29.5 %   Platelets 291 150 - 400 K/uL   nRBC 0.0 0.0 - 0.2 %   Neutrophils Relative % 56 %   Neutro Abs 4.0 1.7 - 7.7 K/uL   Lymphocytes Relative 25 %   Lymphs Abs 1.8 0.7 - 4.0 K/uL   Monocytes Relative 11 %   Monocytes Absolute 0.8 0.1 - 1.0 K/uL   Eosinophils Relative 7 %   Eosinophils Absolute 0.5 0.0 - 0.5 K/uL   Basophils Relative 1 %   Basophils Absolute 0.1 0.0 - 0.1 K/uL   Immature Granulocytes 0 %   Abs Immature Granulocytes 0.02 0.00 - 0.07 K/uL    Comment: Performed at [facility], [address], [place], [place] [address]  TSH     Status: None   Collection Time: 12/28/23  4:07 PM  Result Value Ref Range   TSH 1.275 0.350 - 4.500 uIU/mL    Comment: Performed by a 3rd Generation assay with a functional sensitivity of <=0.01 uIU/mL. Performed at [facility], [address] [place], [place] [address]   CBG monitoring, ED     Status: Abnormal   Collection Time: 12/28/23  8:33 PM  Result Value Ref Range   Glucose-Capillary 118 (H) 70 - 99 mg/dL    Comment: Glucose reference range applies only to samples taken after fasting for at least 8 hours.  Urine rapid drug screen (hosp performed)     Status: Abnormal   Collection Time: 12/28/23  9:06 PM  Result Value Ref Range   Opiates NONE DETECTED NONE DETECTED   Cocaine POSITIVE (A) NONE DETECTED   Benzodiazepines POSITIVE (A) NONE DETECTED   Amphetamines NONE DETECTED NONE DETECTED   Tetrahydrocannabinol NONE DETECTED NONE DETECTED   Barbiturates NONE DETECTED NONE DETECTED     Comment: (NOTE) DRUG SCREEN FOR MEDICAL PURPOSES ONLY.  IF CONFIRMATION IS NEEDED FOR ANY PURPOSE, NOTIFY LAB WITHIN 5 DAYS.  LOWEST DETECTABLE LIMITS FOR URINE DRUG SCREEN Drug Class                     Cutoff (ng/mL) Amphetamine and metabolites    1000 Barbiturate and metabolites    200 Benzodiazepine                 200 Opiates and metabolites        300 Cocaine and metabolites        300 THC                            50 Performed at [facility], [address] [place], [place] [address]     Blood Alcohol level:  Lab Results  Component Value Date   [facility] <15 12/28/2023  Metabolic Disorder Labs:  Lab Results  Component Value Date   HGBA1C 6.3 (H) 04/25/2020   MPG 134 04/25/2020   No results found for: "PROLACTIN" Lab Results  Component Value Date   CHOL 295 (H) 04/25/2020   TRIG 933 (H) 04/25/2020   HDL 36 (L) 04/25/2020   CHOLHDL 8.2 (H) 04/25/2020   VLDL 45.2 (H) 02/22/2016   LDLCALC  04/25/2020     Comment:     . LDL cholesterol not calculated. Triglyceride levels greater than 400 mg/dL invalidate calculated LDL results. . Reference range: <100 . Desirable range <100 mg/dL for primary prevention;   <70 mg/dL for patients with CHD or diabetic patients  with > or = 2 CHD risk factors. Aaron Aas LDL-C is now calculated using the Martin-Hopkins  calculation, which is a validated novel method providing  better accuracy than the Friedewald equation in the  estimation of LDL-C.  Melinda Sprawls et al. Erroll Heard. 1610;960(45): 2061-2068  (http://education.[employer].com/faq/FAQ164)     Current Medications: Current Facility-Administered Medications  Medication Dose Route Frequency Provider Last Rate Last Admin   acetaminophen  (TYLENOL ) tablet 650 mg  650 mg Oral Q6H PRN Onuoha, Chinwendu V, NP       alum & mag hydroxide-simeth (MAALOX/MYLANTA) 200-200-20 MG/5ML suspension 30 mL  30 mL Oral Q4H PRN Onuoha, Chinwendu V, NP       haloperidol  (HALDOL) tablet 5 mg  5 mg Oral TID PRN Onuoha, Chinwendu V, NP       And   diphenhydrAMINE (BENADRYL) capsule 50 mg  50 mg Oral TID PRN Onuoha, Chinwendu V, NP       haloperidol lactate (HALDOL) injection 5 mg  5 mg Intramuscular TID PRN Onuoha, Chinwendu V, NP       And   diphenhydrAMINE (BENADRYL) injection 50 mg  50 mg Intramuscular TID PRN Onuoha, Chinwendu V, NP       And   LORazepam  (ATIVAN ) injection 2 mg  2 mg Intramuscular TID PRN Onuoha, Chinwendu V, NP       haloperidol lactate (HALDOL) injection 10 mg  10 mg Intramuscular TID PRN Onuoha, Chinwendu V, NP       And   diphenhydrAMINE (BENADRYL) injection 50 mg  50 mg Intramuscular TID PRN Onuoha, Chinwendu V, NP       And   LORazepam  (ATIVAN ) injection 2 mg  2 mg Intramuscular TID PRN Onuoha, Chinwendu V, NP       magnesium  hydroxide (MILK OF MAGNESIA) suspension 30 mL  30 mL Oral Daily PRN Onuoha, Chinwendu V, NP       PTA Medications: Medications Prior to Admission  Medication Sig Dispense Refill Last Dose/Taking   albuterol  (VENTOLIN  HFA) 108 (90 Base) MCG/ACT inhaler TAKE 2 PUFFS BY MOUTH EVERY 6 HOURS AS NEEDED FOR WHEEZE OR SHORTNESS OF BREATH (Patient taking differently: Inhale 2 puffs into the lungs every 6 (six) hours as needed for wheezing or shortness of breath.) 8.5 each 1    ALPRAZolam  (XANAX ) 1 MG tablet TAKE 1 TABLET BY MOUTH 3 TIMES A DAY AS NEEDED FOR ANXIETY,PANIC ATTACKS OR AGITATION (Patient taking differently: Take 1 mg by mouth 3 (three) times daily as needed for anxiety (agitation).) 90 tablet 2    budesonide -formoterol  (SYMBICORT ) 80-4.5 MCG/ACT inhaler INHALE 2 PUFFS INTO THE LUNGS TWICE A DAY (Patient taking differently: Inhale 2 puffs into the lungs daily as needed (Wheezing/SOB).) 30.6 each 1    fluticasone  (FLONASE ) 50 MCG/ACT nasal spray Place 1 spray into both nostrils daily. (Patient  taking differently: Place 1 spray into both nostrils daily as needed for rhinitis or allergies.) 16 g 5    ibuprofen  (ADVIL) 800 MG tablet Take 800 mg by mouth every 8 (eight) hours as needed for mild pain (pain score 1-3) or moderate pain (pain score 4-6).      lamoTRIgine  (LAMICTAL ) 150 MG tablet Take 2 tablets (300 mg total) by mouth at bedtime. 180 tablet 0    linaclotide  (LINZESS ) 290 MCG CAPS capsule Take 1 capsule (290 mcg total) by mouth daily before breakfast. 30 capsule 5    pantoprazole  (PROTONIX ) 40 MG tablet Take 40 mg by mouth daily. (Patient not taking: Reported on 12/28/2023)      prochlorperazine  (COMPAZINE ) 10 MG tablet TAKE 1/2 TO 1 TABLET BY MOUTH DAILY AS NEEDED (Patient taking differently: Take 5-10 mg by mouth every 8 (eight) hours as needed for refractory nausea / vomiting, nausea or vomiting.) 20 tablet 2    QUEtiapine  (SEROQUEL ) 300 MG tablet Take 1 tablet (300 mg total) by mouth at bedtime. 90 tablet 0    traMADol  (ULTRAM ) 50 MG tablet Take 50 mg by mouth every 8 (eight) hours as needed for severe pain (pain score 7-10) or moderate pain (pain score 4-6).      zolpidem  (AMBIEN ) 10 MG tablet TAKE 1 TABLET BY MOUTH EVERYDAY AT BEDTIME (Patient taking differently: Take 10 mg by mouth at bedtime.) 30 tablet 2     Musculoskeletal: Strength & Muscle Tone: within normal limits Gait & Station: normal Patient leans: N/A            Psychiatric Specialty Exam:  Presentation  General Appearance:  Casual; Disheveled  Eye Contact: Fair  Speech: Clear and Coherent  Speech Volume: Normal  Handedness:No data recorded  Mood and Affect  Mood: Anxious; Depressed; Irritable  Affect: Depressed   Thought Process  Thought Processes: Coherent  Duration of Psychotic Symptoms:N/A Past Diagnosis of Schizophrenia or Psychoactive disorder: No  Descriptions of Associations:No data recorded Orientation:No data recorded Thought Content:No data recorded Hallucinations:No data recorded Ideas of Reference:No data recorded Suicidal Thoughts:No data recorded Homicidal Thoughts:No  data recorded  Sensorium  Memory:No data recorded Judgment:No data recorded Insight:No data recorded  Executive Functions  Concentration:No data recorded Attention Span:No data recorded Recall:No data recorded Fund of Knowledge:No data recorded Language:No data recorded  Psychomotor Activity  Psychomotor Activity:No data recorded  Assets  Assets:No data recorded  Sleep  Sleep:No data recorded   Physical Exam: Physical Exam Vitals and nursing note reviewed.  Constitutional:      Appearance: Normal appearance.  HENT:     Head: Normocephalic and atraumatic.     Right Ear: Tympanic membrane normal.     Left Ear: Tympanic membrane normal.     Mouth/Throat:     Mouth: Mucous membranes are moist.  Eyes:     Extraocular Movements: Extraocular movements intact.     Pupils: Pupils are equal, round, and reactive to light.  Cardiovascular:     Rate and Rhythm: Normal rate.     Pulses: Normal pulses.  Pulmonary:     Effort: Pulmonary effort is normal.  Musculoskeletal:        General: Normal range of motion.     Cervical back: Normal range of motion.  Neurological:     Mental Status: He is alert.    Review of Systems  Constitutional: Negative.   HENT: Negative.    Eyes: Negative.   Respiratory: Negative.    Cardiovascular: Negative.   Gastrointestinal:  Negative.   Genitourinary: Negative.   Musculoskeletal: Negative.   Skin: Negative.   Neurological: Negative.   Endo/Heme/Allergies: Negative.   Psychiatric/Behavioral:  Positive for depression and substance abuse. The patient is nervous/anxious.    Blood pressure 103/79, pulse (!) 101, temperature (!) 97.5 F (36.4 C), temperature source Temporal, resp. rate 15, height 5\' 9"  (1.753 m), weight 92.6 kg, SpO2 98%. Body mass index is 30.15 kg/m.  Treatment Plan Summary: Daily contact with patient to assess and evaluate symptoms and progress in treatment and Medication management  Observation Level/Precautions:  15  minute checks  Laboratory:   Completed  Psychotherapy:  Per unit protocol  Medications:  To be reviewed  Consultations:  NA  Discharge Concerns:  NA  Estimated LOS:2-4 days  Other:     Physician Treatment Plan for Primary Diagnosis: Bipolar affective disorder (HCC) Long Term Goal(s): Improvement in symptoms so as ready for discharge  Short Term Goals: Ability to identify changes in lifestyle to reduce recurrence of condition will improve, Ability to verbalize feelings will improve, Ability to disclose and discuss suicidal ideas, Ability to demonstrate self-control will improve, Ability to identify and develop effective coping behaviors will improve, Ability to maintain clinical measurements within normal limits will improve, Compliance with prescribed medications will improve, and Ability to identify triggers associated with substance abuse/mental health issues will improve  Physician Treatment Plan for Secondary Diagnosis: Principal Problem:   Bipolar affective disorder (HCC)  Long Term Goal(s): Improvement in symptoms so as ready for discharge  Short Term Goals: Ability to identify changes in lifestyle to reduce recurrence of condition will improve, Ability to verbalize feelings will improve, Ability to disclose and discuss suicidal ideas, Ability to demonstrate self-control will improve, Ability to identify and develop effective coping behaviors will improve, Ability to maintain clinical measurements within normal limits will improve, Compliance with prescribed medications will improve, and Ability to identify triggers associated with substance abuse/mental health issues will improve  I certify that inpatient services furnished can reasonably be expected to improve the patient's condition.    Elston Halsted, NP 4/29/20252:50 PM

## 2023-12-29 NOTE — Progress Notes (Signed)
 Pt calm and cooperative during assessment.Pt denies SI/HI/AVH. Pt has superficial cut on his left shin. Pt observed interacting appropriately with staff and peers on the unit. Pt compliant with medication administration per MD orders. Pt given education, support, and encouragement to be active in his treatment plan. Pt being monitored Q 15 minutes for safety per unit protocol, remains safe on the unit

## 2023-12-29 NOTE — Progress Notes (Signed)
 Pt admitted from [facility] ED, report received from Melinda. Pt calm and cooperative during assessment. Pt stated he was sleepy because he was woken up to transfer at 0300. Pt denies SI/HI/AVH. Pt has superficial cut on his left shin. Pt oriented to the unit and his room. Pt given education, support, and encouragement to be active in his treatment plan. Pt being monitored Q 15 minutes for safety per unit protocol, remains safe on the unit

## 2023-12-30 DIAGNOSIS — F3162 Bipolar disorder, current episode mixed, moderate: Principal | ICD-10-CM

## 2023-12-30 NOTE — Group Note (Signed)
 LCSW Group Therapy Note  Group Date: 12/30/2023 Start Time: 1300 End Time: 1350   Type of Therapy and Topic:  Group Therapy: Anger Cues and Responses  Participation Level:  Did Not Attend   Description of Group:   In this group, patients learned how to recognize the physical, cognitive, emotional, and behavioral responses they have to anger-provoking situations.  They identified a recent time they became angry and how they reacted.  They analyzed how their reaction was possibly beneficial and how it was possibly unhelpful.  The group discussed a variety of healthier coping skills that could help with such a situation in the future.  Focus was placed on how helpful it is to recognize the underlying emotions to our anger, because working on those can lead to a more permanent solution as well as our ability to focus on the important rather than the urgent.  Therapeutic Goals: Patients will remember their last incident of anger and how they felt emotionally and physically, what their thoughts were at the time, and how they behaved. Patients will identify how their behavior at that time worked for them, as well as how it worked against them. Patients will explore possible new behaviors to use in future anger situations. Patients will learn that anger itself is normal and cannot be eliminated, and that healthier reactions can assist with resolving conflict rather than worsening situations.  Summary of Patient Progress:   X  Therapeutic Modalities:   Cognitive Behavioral Therapy    [name], LCSW 12/30/2023  3:14 PM

## 2023-12-30 NOTE — Group Note (Signed)
 Date:  12/30/2023 Time:  6:23 PM  Group Topic/Focus:  Healthy Communication:   The focus of this group is to discuss communication, barriers to communication, as well as healthy ways to communicate with others.    Participation Level:  Did Not Attend   Mellie Sprinkle [facility] 12/30/2023, 6:23 PM

## 2023-12-30 NOTE — Plan of Care (Signed)
  Problem: Education: Goal: Knowledge of [facility] [facility] Education information/materials will improve Outcome: Progressing   Problem: Safety: Goal: Periods of time without injury will increase Outcome: Progressing   Problem: Health Behavior/Discharge Planning: Goal: Identification of resources available to assist in meeting health care needs will improve Outcome: Progressing   Problem: Activity: Goal: Sleeping patterns will improve Outcome: Progressing   Problem: Education: Goal: Verbalization of understanding the information provided will improve Outcome: Progressing   Problem: Education: Goal: Mental status will improve Outcome: Progressing   Problem: Education: Goal: Emotional status will improve Outcome: Progressing

## 2023-12-30 NOTE — [facility] Suicide Risk Assessment (Signed)
 [facility] INPATIENT:  Family/Significant Other Suicide Prevention Education  Suicide Prevention Education:  Education Completed; Sean Wu, wife, [phone],  (name of family member/significant other) has been identified by the patient as the family member/significant other with whom the patient will be residing, and identified as the person(s) who will aid the patient in the event of a mental health crisis (suicidal ideations/suicide attempt).  With written consent from the patient, the family member/significant other has been provided the following suicide prevention education, prior to the and/or following the discharge of the patient.  The suicide prevention education provided includes the following: Suicide risk factors Suicide prevention and interventions National Suicide Hotline telephone number [facility] assessment telephone number [facility] Emergency Assistance 911 [facility] and/or Residential Mobile Crisis Unit telephone number  Request made of family/significant other to: Remove weapons (e.g., guns, rifles, knives), all items previously/currently identified as safety concern.   Remove drugs/medications (over-the-counter, prescriptions, illicit drugs), all items previously/currently identified as a safety concern.  The family member/significant other verbalizes understanding of the suicide prevention education information provided.  The family member/significant other agrees to remove the items of safety concern listed above.  Sean Wu 12/30/2023, 2:39 PM

## 2023-12-30 NOTE — Plan of Care (Signed)
   Problem: Education: Goal: Emotional status will improve Outcome: Progressing Goal: Mental status will improve Outcome: Progressing   Problem: Activity: Goal: Sleeping patterns will improve Outcome: Progressing

## 2023-12-30 NOTE — Progress Notes (Signed)
 [facility] MD Progress Note  12/30/2023 5:25 PM Sean Wu  MRN:  [id number] Subjective:  "Plan is not doing drugs" Principal Problem: Bipolar affective disorder (HCC) Diagnosis: Principal Problem:   Bipolar affective disorder (HCC) Active Problems:   Substance induced mood disorder (HCC)  History of Present Illness:  TTS note: Pt's wife felt he needed an evaluation. He drove himself to [facility]. Pt says that he has had drug use and he has been having things going on in his life. Patient denies any SI feeling and last attempt was decades ago. No HI. Patient says that his wife told him that he had been responding to voices not present. Patient says that he was respnding to her voice but she was not talking to her. Patient has been using cocaine (powder) and last use was last night. Patient says that the cocaine use has "gotten out of control." Patient denies use of other substances. Pt reports depression and anxiety. He has mostly social anxiety. Pt has a psychiatrist, Dr. Dolan Freiberg, who is thorugh telehealth. Patient does not feel he needs any inpatient psychiatric care. He has not been any drug treatment centers in the past. Patient says that he used to  live a healthy lifestyle and he has lost that about a year ago when he started using cocaine regularly.     Assessment: Patient is evaluated face-to-face by this provider and chart/nursing notes reviewed. Sean Wu is a 53 year-old male sitting in his bed. He is somewhat guarded upon approach. He appears disheveled, restless, anxious. He is alert and oriented x 4. He does not appear to be responding to internal stimuli. His thought process is coherent, organized and goal-directed. Patient states "they brought me here, I don;t know why...but I know I am Bipolar". Patient reports that he has had Bipolar since his younger age. Reports he used to self-cut when he was in his teens but no recent episode. Patient reports that he takes his prescribed medications. He  reports that he has been using Cocaine and this is the origine of conflicts with his wife but minimizes the conflicts "we just argue..". He denies SI/HI/AVH. Denies hx of abuse. He shares that he lost his mother about 6 months ago (and cries) and says he was her primary caregiver. Patient admits to feeling depressed but not to the point of killing himself.  Patient states he is wanting to stop using cocaine because "its ruining my family". He denies using any other drugs.  He reports that he has a therapist that his wife found for him (Dr Dolan Freiberg).  Patient denies current medical acuity.    Today Patient attended his treatment team and discussed his treatment goals. He continues to ask when he will be discharged but cooperative and receptive of information provided. He reports that he is planning to continue same medications after discharge. Does not want therapy. Wants to continue with Dr Dolan Freiberg for medication management,     Total Time spent with patient: 30 minutes  Past Psychiatric History: Bipolar, Substance abuse  Past Medical History:  Past Medical History:  Diagnosis Date   ALLERGIC RHINITIS 05/10/2009   Allergy    anemia   Anxiety    Bipolar disorder (HCC)    see medications patient to bring   Chronic headaches    DDD (degenerative disc disease)    DEPRESSION 05/10/2009   Gastroparesis    GERD 05/10/2009   LIBIDO, DECREASED 05/10/2009   Neuromuscular disorder (HCC)    pt. denies 08/01/16  Osgood-Schlatter's disease    SLEEP APNEA, OBSTRUCTIVE 06/12/2009   CPAP not utilized after 30 lb. weight loss   SMOKER 06/12/2009   TESTICULAR HYPOFUNCTION 06/12/2009   WEIGHT GAIN 05/10/2009    Past Surgical History:  Procedure Laterality Date   ANKLE SURGERY Left    COLONOSCOPY  2014   normal   EYE SURGERY     eye implant   FRACTURE SURGERY     surgery on right ankle for ligament torn 2013   LASIK Bilateral    SHOULDER ARTHROSCOPY WITH DISTAL CLAVICLE RESECTION Right 02/26/2015   Procedure:  SHOULDER DIAGNOSTIC ARTHROSCOPY WITH OPEN DISTAL CLAVICLE EXCISION;  Surgeon: Sammye Cristal, MD;  Location: [facility];  Service: Orthopedics;  Laterality: Right;  Right diagnostic arthroscopy, open distal clavical excision   Family History:  Family History  Problem Relation Age of Onset   Irritable bowel syndrome Father    Alcoholism Father    Heart attack Father    Bipolar disorder Father    Heart disease Maternal Grandfather    Colon cancer Neg Hx    Rectal cancer Neg Hx    Stomach cancer Neg Hx    Lung disease Neg Hx    Family Psychiatric  History: NA Social History:  Social History   Substance and Sexual Activity  Alcohol Use Not Currently   Alcohol/week: 1.0 standard drink of alcohol   Types: 1 Cans of beer per week   Comment: social, rare     Social History   Substance and Sexual Activity  Drug Use No    Social History   Socioeconomic History   Marital status: Married    Spouse name: [name]   Number of children: 0   Years of education: 14   Highest education level: Not on file  Occupational History   Occupation: Unemployeed  Tobacco Use   Smoking status: Some Days    Current packs/day: 0.50    Average packs/day: 1.7 packs/day for 30.3 years (52.7 ttl pk-yrs)    Types: Cigarettes    Start date: 1995   Smokeless tobacco: Former    Types: Chew    Quit date: 09/01/1998   Tobacco comments:    tobacco info given 08/01/13.    Smokes 1 ppd.  02/13/23 hfb  Vaping Use   Vaping status: Never Used  Substance and Sexual Activity   Alcohol use: Not Currently    Alcohol/week: 1.0 standard drink of alcohol    Types: 1 Cans of beer per week    Comment: social, rare   Drug use: No   Sexual activity: Yes  Other Topics Concern   Not on file  Social History Narrative   Lives with wife at home   Caffeine use- tea, 3 glasses daily   Social Drivers of Corporate investment banker Strain: Not on file  Food Insecurity: No Food Insecurity (12/29/2023)    Hunger Vital Sign    Worried About Running Out of Food in the Last Year: Never true    Ran Out of Food in the Last Year: Never true  Transportation Needs: No Transportation Needs (12/29/2023)   PRAPARE - Administrator, Civil Service (Medical): No    Lack of Transportation (Non-Medical): No  Physical Activity: Not on file  Stress: Not on file  Social Connections: Socially Isolated (12/29/2023)   Social Connection and Isolation Panel [NHANES]    Frequency of Communication with Friends and Family: Never    Frequency of Social Gatherings with  Friends and Family: Never    Attends Religious Services: Never    Database administrator or Organizations: No    Attends Engineer, structural: Never    Marital Status: Married   Additional Social History:                         Sleep: Fair  Appetite:  Good  Current Medications: Current Facility-Administered Medications  Medication Dose Route Frequency Provider Last Rate Last Admin   acetaminophen  (TYLENOL ) tablet 650 mg  650 mg Oral Q6H PRN Onuoha, Chinwendu V, NP       alum & mag hydroxide-simeth (MAALOX/MYLANTA) 200-200-20 MG/5ML suspension 30 mL  30 mL Oral Q4H PRN Onuoha, Chinwendu V, NP       haloperidol (HALDOL) tablet 5 mg  5 mg Oral TID PRN Onuoha, Chinwendu V, NP       And   diphenhydrAMINE (BENADRYL) capsule 50 mg  50 mg Oral TID PRN Onuoha, Chinwendu V, NP       haloperidol lactate (HALDOL) injection 5 mg  5 mg Intramuscular TID PRN Onuoha, Chinwendu V, NP       And   diphenhydrAMINE (BENADRYL) injection 50 mg  50 mg Intramuscular TID PRN Onuoha, Chinwendu V, NP       And   LORazepam  (ATIVAN ) injection 2 mg  2 mg Intramuscular TID PRN Onuoha, Chinwendu V, NP       haloperidol lactate (HALDOL) injection 10 mg  10 mg Intramuscular TID PRN Onuoha, Chinwendu V, NP       And   diphenhydrAMINE (BENADRYL) injection 50 mg  50 mg Intramuscular TID PRN Onuoha, Chinwendu V, NP       And   LORazepam  (ATIVAN )  injection 2 mg  2 mg Intramuscular TID PRN Onuoha, Chinwendu V, NP       lamoTRIgine  (LAMICTAL ) tablet 150 mg  150 mg Oral QHS Treyvion Durkee M, NP   150 mg at 12/29/23 2116   magnesium  hydroxide (MILK OF MAGNESIA) suspension 30 mL  30 mL Oral Daily PRN Onuoha, Chinwendu V, NP       pantoprazole  (PROTONIX ) EC tablet 40 mg  40 mg Oral Daily Fidelis Loth M, NP   40 mg at 12/30/23 9147   QUEtiapine  (SEROQUEL ) tablet 200 mg  200 mg Oral QHS Elston Halsted M, NP   200 mg at 12/29/23 2116    Lab Results:  Results for orders placed or performed during the hospital encounter of 12/28/23 (from the past 48 hours)  CBG monitoring, ED     Status: Abnormal   Collection Time: 12/28/23  8:33 PM  Result Value Ref Range   Glucose-Capillary 118 (H) 70 - 99 mg/dL    Comment: Glucose reference range applies only to samples taken after fasting for at least 8 hours.  Urine rapid drug screen (hosp performed)     Status: Abnormal   Collection Time: 12/28/23  9:06 PM  Result Value Ref Range   Opiates NONE DETECTED NONE DETECTED   Cocaine POSITIVE (A) NONE DETECTED   Benzodiazepines POSITIVE (A) NONE DETECTED   Amphetamines NONE DETECTED NONE DETECTED   Tetrahydrocannabinol NONE DETECTED NONE DETECTED   Barbiturates NONE DETECTED NONE DETECTED    Comment: (NOTE) DRUG SCREEN FOR MEDICAL PURPOSES ONLY.  IF CONFIRMATION IS NEEDED FOR ANY PURPOSE, NOTIFY LAB WITHIN 5 DAYS.  LOWEST DETECTABLE LIMITS FOR URINE DRUG SCREEN Drug Class  Cutoff (ng/mL) Amphetamine and metabolites    1000 Barbiturate and metabolites    200 Benzodiazepine                 200 Opiates and metabolites        300 Cocaine and metabolites        300 THC                            50 Performed at [facility], [address], [place], [place] [address]     Blood Alcohol level:  Lab Results  Component Value Date   [facility] <15 12/28/2023    Metabolic Disorder Labs: Lab Results   Component Value Date   HGBA1C 6.3 (H) 04/25/2020   MPG 134 04/25/2020   No results found for: "PROLACTIN" Lab Results  Component Value Date   CHOL 295 (H) 04/25/2020   TRIG 933 (H) 04/25/2020   HDL 36 (L) 04/25/2020   CHOLHDL 8.2 (H) 04/25/2020   VLDL 45.2 (H) 02/22/2016   LDLCALC  04/25/2020     Comment:     . LDL cholesterol not calculated. Triglyceride levels greater than 400 mg/dL invalidate calculated LDL results. . Reference range: <100 . Desirable range <100 mg/dL for primary prevention;   <70 mg/dL for patients with CHD or diabetic patients  with > or = 2 CHD risk factors. Aaron Aas LDL-C is now calculated using the Martin-Hopkins  calculation, which is a validated novel method providing  better accuracy than the Friedewald equation in the  estimation of LDL-C.  Melinda Sprawls et al. Erroll Heard. 6962;952(84): 2061-2068  (http://education.[facility].com/faq/FAQ164)     Physical Findings: AIMS:  , ,  ,  ,    CIWA:    COWS:     Musculoskeletal: Strength & Muscle Tone: within normal limits Gait & Station: normal Patient leans: N/A  Psychiatric Specialty Exam:  Presentation  General Appearance:  Casual  Eye Contact: Fair  Speech: Clear and Coherent  Speech Volume: Normal  Handedness: Right   Mood and Affect  Mood: Anxious  Affect: Congruent   Thought Process  Thought Processes: Coherent  Descriptions of Associations:Intact  Orientation:No data recorded Thought Content:WDL  History of Schizophrenia/Schizoaffective disorder:No  Duration of Psychotic Symptoms:N/A  Hallucinations:Hallucinations: None  Ideas of Reference:None  Suicidal Thoughts:Suicidal Thoughts: No  Homicidal Thoughts:Homicidal Thoughts: No   Sensorium  Memory: Immediate Fair; Recent Fair; Remote Fair  Judgment: Fair  Insight:No data recorded  Executive Functions  Concentration: Fair  Attention Span: Fair  Recall: [employer] of  Knowledge: Fair  Language: Fair   Psychomotor Activity  Psychomotor Activity: Psychomotor Activity: Restlessness   Assets  Assets: Communication Skills; Desire for Improvement; Social Support   Sleep  Sleep: Sleep: Fair    Physical Exam: Physical Exam Vitals and nursing note reviewed.  Constitutional:      Appearance: Normal appearance.  HENT:     Head: Normocephalic and atraumatic.  Eyes:     Extraocular Movements: Extraocular movements intact.     Pupils: Pupils are equal, round, and reactive to light.  Cardiovascular:     Rate and Rhythm: Normal rate.     Pulses: Normal pulses.  Pulmonary:     Effort: Pulmonary effort is normal.  Musculoskeletal:        General: Normal range of motion.     Cervical back: Normal range of motion.  Neurological:     General: No focal deficit present.  Mental Status: He is alert and oriented to person, place, and time.    Review of Systems  Constitutional: Negative.   HENT: Negative.    Eyes: Negative.   Respiratory: Negative.    Cardiovascular: Negative.   Gastrointestinal: Negative.   Genitourinary: Negative.   Musculoskeletal: Negative.   Skin: Negative.   Neurological: Negative.   Endo/Heme/Allergies: Negative.   Psychiatric/Behavioral:  Positive for substance abuse. The patient is nervous/anxious.    Blood pressure 122/82, pulse 80, temperature 97.9 F (36.6 C), resp. rate 20, height 5\' 9"  (1.753 m), weight 92.6 kg, SpO2 98%. Body mass index is 30.15 kg/m.   Treatment Plan Summary: Daily contact with patient to assess and evaluate symptoms and progress in treatment and Medication management  Elston Halsted, NP 12/30/2023, 5:25 PM

## 2023-12-30 NOTE — Group Note (Signed)
 Date:  12/30/2023 Time:  9:06 PM  Group Topic/Focus:  Wrap-Up Group:   The focus of this group is to help patients review their daily goal of treatment and discuss progress on daily workbooks.    Participation Level:  Active  Participation Quality:  Appropriate  Affect:  Appropriate  Cognitive:  Appropriate  Insight: Appropriate  Engagement in Group:  Engaged  Modes of Intervention:  Discussion   Bradley Caffey 12/30/2023, 9:06 PM

## 2023-12-30 NOTE — BH IP Treatment Plan (Addendum)
 Interdisciplinary Treatment and Diagnostic Plan Update  12/30/2023 Time of Session: 10:48 AM Sean Wu MRN: [id number]  Principal Diagnosis: Bipolar affective disorder [facility])  Secondary Diagnoses: Principal Problem:   Bipolar affective disorder (HCC) Active Problems:   Substance induced mood disorder (HCC)   Current Medications:  Current Facility-Administered Medications  Medication Dose Route Frequency Provider Last Rate Last Admin   acetaminophen  (TYLENOL ) tablet 650 mg  650 mg Oral Q6H PRN Onuoha, Chinwendu V, NP       alum & mag hydroxide-simeth (MAALOX/MYLANTA) 200-200-20 MG/5ML suspension 30 mL  30 mL Oral Q4H PRN Onuoha, Chinwendu V, NP       haloperidol (HALDOL) tablet 5 mg  5 mg Oral TID PRN Onuoha, Chinwendu V, NP       And   diphenhydrAMINE (BENADRYL) capsule 50 mg  50 mg Oral TID PRN Onuoha, Chinwendu V, NP       haloperidol lactate (HALDOL) injection 5 mg  5 mg Intramuscular TID PRN Onuoha, Chinwendu V, NP       And   diphenhydrAMINE (BENADRYL) injection 50 mg  50 mg Intramuscular TID PRN Onuoha, Chinwendu V, NP       And   LORazepam  (ATIVAN ) injection 2 mg  2 mg Intramuscular TID PRN Onuoha, Chinwendu V, NP       haloperidol lactate (HALDOL) injection 10 mg  10 mg Intramuscular TID PRN Onuoha, Chinwendu V, NP       And   diphenhydrAMINE (BENADRYL) injection 50 mg  50 mg Intramuscular TID PRN Onuoha, Chinwendu V, NP       And   LORazepam  (ATIVAN ) injection 2 mg  2 mg Intramuscular TID PRN Onuoha, Chinwendu V, NP       lamoTRIgine  (LAMICTAL ) tablet 150 mg  150 mg Oral QHS Lorrene Rosser, Veronique M, NP   150 mg at 12/29/23 2116   magnesium  hydroxide (MILK OF MAGNESIA) suspension 30 mL  30 mL Oral Daily PRN Onuoha, Chinwendu V, NP       pantoprazole  (PROTONIX ) EC tablet 40 mg  40 mg Oral Daily Byungura, Veronique M, NP   40 mg at 12/30/23 4098   QUEtiapine  (SEROQUEL ) tablet 200 mg  200 mg Oral QHS Elston Halsted M, NP   200 mg at 12/29/23 2116   PTA  Medications: Medications Prior to Admission  Medication Sig Dispense Refill Last Dose/Taking   albuterol  (VENTOLIN  HFA) 108 (90 Base) MCG/ACT inhaler TAKE 2 PUFFS BY MOUTH EVERY 6 HOURS AS NEEDED FOR WHEEZE OR SHORTNESS OF BREATH (Patient taking differently: Inhale 2 puffs into the lungs every 6 (six) hours as needed for wheezing or shortness of breath.) 8.5 each 1    ALPRAZolam  (XANAX ) 1 MG tablet TAKE 1 TABLET BY MOUTH 3 TIMES A DAY AS NEEDED FOR ANXIETY,PANIC ATTACKS OR AGITATION (Patient taking differently: Take 1 mg by mouth 3 (three) times daily as needed for anxiety (agitation).) 90 tablet 2    budesonide -formoterol  (SYMBICORT ) 80-4.5 MCG/ACT inhaler INHALE 2 PUFFS INTO THE LUNGS TWICE A DAY (Patient taking differently: Inhale 2 puffs into the lungs daily as needed (Wheezing/SOB).) 30.6 each 1    fluticasone  (FLONASE ) 50 MCG/ACT nasal spray Place 1 spray into both nostrils daily. (Patient taking differently: Place 1 spray into both nostrils daily as needed for rhinitis or allergies.) 16 g 5    ibuprofen (ADVIL) 800 MG tablet Take 800 mg by mouth every 8 (eight) hours as needed for mild pain (pain score 1-3) or moderate pain (pain score 4-6).  lamoTRIgine  (LAMICTAL ) 150 MG tablet Take 2 tablets (300 mg total) by mouth at bedtime. 180 tablet 0    linaclotide  (LINZESS ) 290 MCG CAPS capsule Take 1 capsule (290 mcg total) by mouth daily before breakfast. 30 capsule 5    pantoprazole  (PROTONIX ) 40 MG tablet Take 40 mg by mouth daily. (Patient not taking: Reported on 12/28/2023)      prochlorperazine  (COMPAZINE ) 10 MG tablet TAKE 1/2 TO 1 TABLET BY MOUTH DAILY AS NEEDED (Patient taking differently: Take 5-10 mg by mouth every 8 (eight) hours as needed for refractory nausea / vomiting, nausea or vomiting.) 20 tablet 2    QUEtiapine  (SEROQUEL ) 300 MG tablet Take 1 tablet (300 mg total) by mouth at bedtime. 90 tablet 0    traMADol  (ULTRAM ) 50 MG tablet Take 50 mg by mouth every 8 (eight) hours as needed  for severe pain (pain score 7-10) or moderate pain (pain score 4-6).      zolpidem  (AMBIEN ) 10 MG tablet TAKE 1 TABLET BY MOUTH EVERYDAY AT BEDTIME (Patient taking differently: Take 10 mg by mouth at bedtime.) 30 tablet 2     Patient Stressors: Marital or family conflict   Substance abuse    Patient Strengths: Capable of independent living  Communication skills   Treatment Modalities: Medication Management, Group therapy, Case management,  1 to 1 session with clinician, Psychoeducation, Recreational therapy.   Physician Treatment Plan for Primary Diagnosis: Bipolar affective disorder (HCC) Long Term Goal(s): Improvement in symptoms so as ready for discharge   Short Term Goals: Ability to identify changes in lifestyle to reduce recurrence of condition will improve Ability to verbalize feelings will improve Ability to disclose and discuss suicidal ideas Ability to demonstrate self-control will improve Ability to identify and develop effective coping behaviors will improve Ability to maintain clinical measurements within normal limits will improve Compliance with prescribed medications will improve Ability to identify triggers associated with substance abuse/mental health issues will improve  Medication Management: Evaluate patient's response, side effects, and tolerance of medication regimen.  Therapeutic Interventions: 1 to 1 sessions, Unit Group sessions and Medication administration.  Evaluation of Outcomes: Not Met  Physician Treatment Plan for Secondary Diagnosis: Principal Problem:   Bipolar affective disorder (HCC) Active Problems:   Substance induced mood disorder (HCC)  Long Term Goal(s): Improvement in symptoms so as ready for discharge   Short Term Goals: Ability to identify changes in lifestyle to reduce recurrence of condition will improve Ability to verbalize feelings will improve Ability to disclose and discuss suicidal ideas Ability to demonstrate self-control  will improve Ability to identify and develop effective coping behaviors will improve Ability to maintain clinical measurements within normal limits will improve Compliance with prescribed medications will improve Ability to identify triggers associated with substance abuse/mental health issues will improve     Medication Management: Evaluate patient's response, side effects, and tolerance of medication regimen.  Therapeutic Interventions: 1 to 1 sessions, Unit Group sessions and Medication administration.  Evaluation of Outcomes: Not Met   RN Treatment Plan for Primary Diagnosis: Bipolar affective disorder (HCC) Long Term Goal(s): Knowledge of disease and therapeutic regimen to maintain health will improve  Short Term Goals: Ability to verbalize frustration and anger appropriately will improve, Ability to demonstrate self-control, Ability to participate in decision making will improve, Ability to verbalize feelings will improve, Ability to disclose and discuss suicidal ideas, Ability to identify and develop effective coping behaviors will improve, and Compliance with prescribed medications will improve  Medication Management: RN will administer  medications as ordered by provider, will assess and evaluate patient's response and provide education to patient for prescribed medication. RN will report any adverse and/or side effects to prescribing provider.  Therapeutic Interventions: 1 on 1 counseling sessions, Psychoeducation, Medication administration, Evaluate responses to treatment, Monitor vital signs and CBGs as ordered, Perform/monitor CIWA, COWS, AIMS and Fall Risk screenings as ordered, Perform wound care treatments as ordered.  Evaluation of Outcomes: Not Met   LCSW Treatment Plan for Primary Diagnosis: Bipolar affective disorder (HCC) Long Term Goal(s): Safe transition to appropriate next level of care at discharge, Engage patient in therapeutic group addressing interpersonal  concerns.  Short Term Goals: Engage patient in aftercare planning with referrals and resources, Increase social support, Increase ability to appropriately verbalize feelings, Increase emotional regulation, Facilitate acceptance of mental health diagnosis and concerns, Facilitate patient progression through stages of change regarding substance use diagnoses and concerns, Identify triggers associated with mental health/substance abuse issues, and Increase skills for wellness and recovery  Therapeutic Interventions: Assess for all discharge needs, 1 to 1 time with Social worker, Explore available resources and support systems, Assess for adequacy in community support network, Educate family and significant other(s) on suicide prevention, Complete Psychosocial Assessment, Interpersonal group therapy.  Evaluation of Outcomes: Not Met   Progress in Treatment: Attending groups: No. Participating in groups: No. Taking medication as prescribed: Yes. Toleration medication: Yes. Family/Significant other contact made: No, will contact:  CSW to contact once permission is granted.  Patient understands diagnosis: Yes. Discussing patient identified problems/goals with staff: Yes. Medical problems stabilized or resolved: Yes. Denies suicidal/homicidal ideation: Yes. Issues/concerns per patient self-inventory: No. Other: None   New problem(s) identified: No, Describe:  None   New Short Term/Long Term Goal(s): detox, elimination of symptoms of psychosis, medication management for mood stabilization; elimination of SI thoughts; development of comprehensive mental wellness/sobriety plan.    Patient Goals:  "Not doing drugs anymore."   Discharge Plan or Barriers: CSW to assist in the development of appropriate discharge plan.   Reason for Continuation of Hospitalization: Anxiety Depression Medication stabilization Suicidal ideation Withdrawal symptoms  Estimated Length of Stay: 1-7 days  Last 3  [place] Suicide Severity Risk Score: Flowsheet Row Admission (Current) from 12/29/2023 in [facility] ED from 12/28/2023 in [facility] Emergency Department at [facility] ED from 08/28/2021 in [facility] Emergency Department at [facility]  C-SSRS RISK CATEGORY No Risk No Risk No Risk       Last PHQ 2/9 Scores:    02/22/2016    8:57 AM  Depression screen PHQ 2/9  Decreased Interest 0  Down, Depressed, Hopeless 1  PHQ - 2 Score 1    Scribe for Treatment Team: Maimouna Rondeau M Elgie Maziarz, LCSW 12/30/2023 2:03 PM

## 2023-12-30 NOTE — Progress Notes (Signed)
   12/30/23 1100  Psych Admission Type (Psych Patients Only)  Admission Status Voluntary  Psychosocial Assessment  Patient Complaints None  Eye Contact Fair  Facial Expression Animated  Affect Appropriate to circumstance  Speech Logical/coherent  Interaction Assertive  Motor Activity Slow  Appearance/Hygiene Unremarkable  Behavior Characteristics Cooperative  Mood Labile  Thought Process  Coherency WDL  Content WDL  Delusions None reported or observed  Perception WDL  Hallucination None reported or observed  Judgment Impaired  Confusion None  Danger to Self  Current suicidal ideation? Denies  Agreement Not to Harm Self Yes  Description of Agreement verbal  Danger to Others  Danger to Others None reported or observed

## 2023-12-30 NOTE — Group Note (Signed)
 Date:  12/30/2023 Time:  1:02 PM  Group Topic/Focus:  Goals Group:   The focus of this group is to help patients establish daily goals to achieve during treatment and discuss how the patient can incorporate goal setting into their daily lives to aide in recovery.    Participation Level:  Did Not Attend   Mellie Sprinkle [facility] 12/30/2023, 1:02 PM

## 2023-12-30 NOTE — BHH Counselor (Signed)
 CSW spoke with Jacarie Bouknight, wife, [phone].    Wife reports that patient is not a danger to self or others.   She denies acces to weapons.    She reports that she is unsure why patient is on the psych unit. CSW provided psycho-education.    She reports that the patient was "acting funny" so she brought him to the hospital but did not expect him to end up on the psych unit.    She reports no concerns for patient returning home and reports that she will provide transportation.   Shasta Deist, MSW, LCSW 12/30/2023 2:42 PM

## 2023-12-30 NOTE — Progress Notes (Signed)
 Pt calm and cooperative during assessment.Pt denies SI/HI/AVH. Pt has superficial cut on his left shin. Pt observed interacting appropriately with staff and peers on the unit. Pt compliant with medication administration per MD orders. Pt given education, support, and encouragement to be active in his treatment plan. Pt being monitored Q 15 minutes for safety per unit protocol, remains safe on the unit

## 2023-12-31 NOTE — BHH Suicide Risk Assessment (Signed)
 [facility] Discharge Suicide Risk Assessment   Principal Problem: Bipolar affective disorder [facility]) Discharge Diagnoses: Principal Problem:   Bipolar affective disorder (HCC) Active Problems:   Substance induced mood disorder (HCC)   Bipolar 1 disorder, mixed, moderate (HCC) Pt's wife felt he needed an evaluation. He drove himself to [facility]. Pt says that he has had drug use and he has been having things going on in his life. Patient denies any SI feeling and last attempt was decades ago. No HI. Patient says that his wife told him that he had been responding to voices not present. Patient says that he was respnding to her voice but she was not talking to her. Patient has been using cocaine (powder) and last use was last night. Patient says that the cocaine use has "gotten out of control." Patient denies use of other substances. Pt reports depression and anxiety. He has mostly social anxiety. Pt has a psychiatrist, Dr. Dolan Freiberg, who is thorugh telehealth. Patient does not feel he needs any inpatient psychiatric care. He has not been any drug treatment centers in the past. Patient says that he used to live a healthy lifestyle and he has lost that about a year ago when he started using cocaine regularly.    Assessment: Patient is evaluated face-to-face by this provider and chart/nursing notes reviewed. Sean Wu is a 53 year-old male sitting in his bed. He is cooperative upon approach. His hygiene is improved. He is alert and oriented x 4. He does not appear to be responding to internal stimuli. His thought process is coherent, organized and goal-directed. Patient states "they brought me here, I don;t know why...but I know I am Bipolar". Patient frequently asks when he will be discharged.  Patient reports that he has had Bipolar since his younger age. Reports he was not taking his medications as prescribed, and was using drugs excessively.  Patient reports that his goal is to take his prescribed medications and stay  away from drugs. He reports that he has been using Cocaine and this is the origine of conflicts with his wife but minimizes the conflicts "we just argue..". He denies SI/HI/AVH. Denies hx of abuse. He shares that he lost his mother about 6 months ago (and cries) and says he was her primary caregiver. Patient admits to feeling depressed but not to the point of killing himself.  He admits that he was minimizing his emotiona in regard to the loss "but now I can understand where it can take you to...guess I chose to do drugs...".  Patient states he is wanting to stop using cocaine because "its ruining my family". He denies using any other drugs.  He reports that he has provider  (Dr Dolan Freiberg) and plans to continue services through him.  Patient denies current medical acuity.     Patient has been communicating with his wife who is his primary support. Wife  [name] was contacted at [phone] and she verbalized that she is ready to help and support with recovery process. There are no safety issues at home.     Total Time spent with patient: 45 minutes  Musculoskeletal: Strength & Muscle Tone: within normal limits Gait & Station: normal Patient leans: N/A  Psychiatric Specialty Exam  Presentation  General Appearance:  Casual  Eye Contact: Fair  Speech: Clear and Coherent  Speech Volume: Normal  Handedness: Right   Mood and Affect  Mood: Anxious  Duration of Depression Symptoms: Greater than two weeks  Affect: Congruent   Thought Process  Thought Processes: Coherent  Descriptions of Associations:Intact  Orientation:No data recorded Thought Content:WDL  History of Schizophrenia/Schizoaffective disorder:No  Duration of Psychotic Symptoms:N/A  Hallucinations:Hallucinations: None  Ideas of Reference:None  Suicidal Thoughts:Suicidal Thoughts: No  Homicidal Thoughts:Homicidal Thoughts: No   Sensorium  Memory: Immediate Fair; Recent Fair; Remote  Fair  Judgment: Fair  Insight:No data recorded  Executive Functions  Concentration: Fair  Attention Span: Fair  Recall: [employer] of Knowledge: Fair  Language: Fair   Psychomotor Activity  Psychomotor Activity: Psychomotor Activity: Restlessness   Assets  Assets: Communication Skills; Desire for Improvement; Social Support   Sleep  Sleep: Sleep: Fair   Physical Exam: Physical Exam Vitals and nursing note reviewed.  Constitutional:      Appearance: Normal appearance.  HENT:     Head: Normocephalic and atraumatic.     Right Ear: Tympanic membrane normal.     Left Ear: Tympanic membrane normal.     Nose: Nose normal.     Mouth/Throat:     Mouth: Mucous membranes are moist.  Eyes:     Extraocular Movements: Extraocular movements intact.     Pupils: Pupils are equal, round, and reactive to light.  Cardiovascular:     Rate and Rhythm: Normal rate.     Pulses: Normal pulses.  Pulmonary:     Effort: Pulmonary effort is normal.  Musculoskeletal:        General: Normal range of motion.     Cervical back: Normal range of motion and neck supple.  Neurological:     General: No focal deficit present.     Mental Status: He is alert and oriented to person, place, and time.  Psychiatric:        Behavior: Behavior normal.        Thought Content: Thought content normal.        Judgment: Judgment normal.    Review of Systems  Constitutional: Negative.   HENT: Negative.    Eyes: Negative.   Respiratory: Negative.    Cardiovascular: Negative.   Gastrointestinal: Negative.   Genitourinary: Negative.   Musculoskeletal: Negative.   Skin: Negative.   Neurological: Negative.   Endo/Heme/Allergies: Negative.   Psychiatric/Behavioral:  Positive for substance abuse.    Blood pressure 94/77, pulse 87, temperature 98.1 F (36.7 C), resp. rate 16, height 5\' 9"  (1.753 m), weight 92.6 kg, SpO2 96%. Body mass index is 30.15 kg/m.  Mental Status Per Nursing  Assessment::   On Admission:  NA (Simultaneous filing. User may not have seen previous data.)  Demographic Factors:  Divorced or widowed  Loss Factors: NA  Historical Factors: NA  Risk Reduction Factors:   Living with another person, especially a relative and Positive social support  Continued Clinical Symptoms:  Previous Psychiatric Diagnoses and Treatments  Cognitive Features That Contribute To Risk:  None    Suicide Risk:  Minimal: No identifiable suicidal ideation.  Patients presenting with no risk factors but with morbid ruminations; may be classified as minimal risk based on the severity of the depressive symptoms   Follow-up Information     [facility] Follow up.   Why: In person psychiatry appointment 05/14 at 12:30 PM with Dr. Dolan Freiberg. Contact information: [address],  [place] [address]   [phone]   fax [phone]  [email or web address]        [facility] Follow up.   Why: Although you declined outpatient therapy follow up, [facility]'s walk in  hours for crisis services are: 24 Hours Monday-Friday at the [facility] at [address] [place], [place] [address]. Contact information: [phone]  Address [address] [place], [place] [address]  Hours Open 24/7. No appointment required.                Plan Of Care/Follow-up recommendations:  Activity:  As tolerate Diet:  Regular  Elston Halsted, NP 12/31/2023, 2:29 PM

## 2023-12-31 NOTE — Group Note (Signed)
 [facility] LCSW Group Therapy Note   Group Date: 12/31/2023 Start Time: 1300 End Time: 1345   Type of Therapy/Topic:  Group Therapy:  Balance in Life  Participation Level:  Did Not Attend   Description of Group:    This group will address the concept of balance and how it feels and looks when one is unbalanced. Patients will be encouraged to process areas in their lives that are out of balance, and identify reasons for remaining unbalanced. Facilitators will guide patients utilizing problem- solving interventions to address and correct the stressor making their life unbalanced. Understanding and applying boundaries will be explored and addressed for obtaining  and maintaining a balanced life. Patients will be encouraged to explore ways to assertively make their unbalanced needs known to significant others in their lives, using other group members and facilitator for support and feedback.  Therapeutic Goals: Patient will identify two or more emotions or situations they have that consume much of in their lives. Patient will identify signs/triggers that life has become out of balance:  Patient will identify two ways to set boundaries in order to achieve balance in their lives:  Patient will demonstrate ability to communicate their needs through discussion and/or role plays  Summary of Patient Progress: Patient did not attend group.    Therapeutic Modalities:   Cognitive Behavioral Therapy Solution-Focused Therapy Assertiveness Training   Randolm Butte, LCSW

## 2023-12-31 NOTE — Progress Notes (Signed)
  [facility] Adult Case Management Discharge Plan :  Will you be returning to the same living situation after discharge:  Yes,  pt plans to return home upon discharge.  At discharge, do you have transportation home?: Yes,  wife to provide transportation home. Do you have the ability to pay for your medications: Yes,  BLUE CROSS BLUE SHIELD / [employer] COMM PPO  Release of information consent forms completed and in the chart;  Patient's signature needed at discharge.  Patient to Follow up at:  Follow-up Information     [facility] Follow up.   Why: In person psychiatry appointment 05/14 at 12:30 PM with Dr. Dolan Freiberg. Contact information: [address], [address],  [place] [address]   [phone]   fax [phone]  [email or web address]        [place] [facility] Follow up.   Why: Although you declined outpatient therapy follow up, [facility]'s walk in hours for crisis services are: 24 Hours Monday-Friday at the [facility] at [address] [place], [place] [address]. Contact information: [phone]  Address [address] [place], [place] [address]  Hours Open 24/7. No appointment required.                Next level of care provider has access to [facility] Link:no  Safety Planning and Suicide Prevention discussed: Yes,  SPE completed with wife, Maison Stano.      Has patient been referred to the Quitline?: Patient refused referral for treatment  Patient has been referred for addiction treatment: Patient refused referral for treatment.  Randolm Butte, LCSW 12/31/2023, 11:03 AM

## 2023-12-31 NOTE — Group Note (Signed)
 Date:  12/31/2023 Time:  10:11 AM  Group Topic/Focus:  Spirituality:   The focus of this group is to discuss how one's spirituality can aide in recovery.    Participation Level:  Did Not Attend   Sean Wu [name] 12/31/2023, 10:11 AM

## 2023-12-31 NOTE — Group Note (Signed)
 Recreation Therapy Group Note   Group Topic:Goal Setting  Group Date: 12/31/2023 Start Time: 1000 End Time: 1100 Facilitators: Deatrice Factor, LRT, CTRS Location:  Craft Room  Group Description: Product/process development scientist. Patients were given many different magazines, a glue stick, markers, and a piece of cardstock paper. LRT and pts discussed the importance of having goals in life. LRT and pts discussed the difference between short-term and long-term goals, as well as what a SMART goal is. LRT encouraged pts to create a vision board, with images they picked and then cut out with safety scissors from the magazine, for themselves, that capture their short and long-term goals. LRT encouraged pts to show and explain their vision board to the group.   Goal Area(s) Addressed:  Patient will gain knowledge of short vs. long term goals.  Patient will identify goals for themselves. Patient will practice setting SMART goals. Patient will verbalize their goals to LRT and peers.   Affect/Mood: N/A   Participation Level: Did not attend    Clinical Observations/Individualized Feedback: Patient did not attend group.   Plan: Continue to engage patient in RT group sessions 2-3x/week.   Deatrice Factor, LRT, CTRS 12/31/2023 1:21 PM

## 2023-12-31 NOTE — Plan of Care (Signed)
   Problem: Education: Goal: Emotional status will improve Outcome: Progressing Goal: Mental status will improve Outcome: Progressing

## 2023-12-31 NOTE — Progress Notes (Signed)
Patient is discharging at this time. Patient is A&Ox4. Stable. Patient denies SI,HI, and A/V/H with no plan/intent. Printed AVS reviewed with and given to patient along with medications and follow up appointments. Suicide safety plan complete with copy provided to patient. Original form in assigned binder. Patient verbalized all understanding. All valuables/belongings returned to patient. Patient is being transported by his wife. Patient denies any pain/discomfort. No s/s of current distress.

## 2023-12-31 NOTE — Discharge Summary (Signed)
 Physician Discharge Summary Note  Patient:  Sean Wu is an 53 y.o., male MRN:  [id number] DOB:  1971-08-04 Patient phone:  [phone] (home)  Patient address:   [address] [place] [facility] [address],   Assessment: Patient is evaluated face-to-face by this provider and chart/nursing notes reviewed. Sean Wu is a 53 year-old male sitting in his bed. He is cooperative upon approach. His hygiene is improved. He is alert and oriented x 4. He does not appear to be responding to internal stimuli. His thought process is coherent, organized and goal-directed. Patient states "they brought me here, I don;t know why...but I know I am Bipolar". Patient frequently asks when he will be discharged.  Patient reports that he has had Bipolar since his younger age. Reports he was not taking his medications as prescribed, and was using drugs excessively.  Patient reports that his goal is to take his prescribed medications and stay away from drugs. He reports that he has been using Cocaine and this is the origine of conflicts with his wife but minimizes the conflicts "we just argue..". He denies SI/HI/AVH. Denies hx of abuse. He shares that he lost his mother about 6 months ago (and cries) and says he was her primary caregiver. Patient admits to feeling depressed but not to the point of killing himself.  He admits that he was minimizing his emotiona in regard to the loss "but now I can understand where it can take you to...guess I chose to do drugs...".  Patient states he is wanting to stop using cocaine because "its ruining my family". He denies using any other drugs.  He reports that he has provider  (Dr Dolan Freiberg) and plans to continue services through him.  Patient denies current medical acuity.      Patient has been communicating with his wife who is his primary support. Wife  [name] was contacted at [phone] and she verbalized that she is ready to help and support with recovery process. There are no safety  issues at home.    Total Time spent with patient: 45 minutes  Date of Admission:  12/29/2023 Date of Discharge: 12/31/2023  Reason for Admission:  Substance induced mood change/Bipolar !  Principal Problem: Bipolar affective disorder [facility]) Discharge Diagnoses: Principal Problem:   Bipolar affective disorder (HCC) Active Problems:   Substance induced mood disorder (HCC)   Bipolar 1 disorder, mixed, moderate (HCC)   Past Psychiatric History: Bipolar, Cocaine abuse  Past Medical History:  Past Medical History:  Diagnosis Date   ALLERGIC RHINITIS 05/10/2009   Allergy    anemia   Anxiety    Bipolar disorder (HCC)    see medications patient to bring   Chronic headaches    DDD (degenerative disc disease)    DEPRESSION 05/10/2009   Gastroparesis    GERD 05/10/2009   LIBIDO, DECREASED 05/10/2009   Neuromuscular disorder (HCC)    pt. denies 08/01/16   Osgood-Schlatter's disease    SLEEP APNEA, OBSTRUCTIVE 06/12/2009   CPAP not utilized after 30 lb. weight loss   SMOKER 06/12/2009   TESTICULAR HYPOFUNCTION 06/12/2009   WEIGHT GAIN 05/10/2009    Past Surgical History:  Procedure Laterality Date   ANKLE SURGERY Left    COLONOSCOPY  2014   normal   EYE SURGERY     eye implant   FRACTURE SURGERY     surgery on right ankle for ligament torn 2013   LASIK Bilateral    SHOULDER ARTHROSCOPY WITH DISTAL CLAVICLE RESECTION Right 02/26/2015  Procedure: SHOULDER DIAGNOSTIC ARTHROSCOPY WITH OPEN DISTAL CLAVICLE EXCISION;  Surgeon: Sammye Cristal, MD;  Location: [facility];  Service: Orthopedics;  Laterality: Right;  Right diagnostic arthroscopy, open distal clavical excision   Family History:  Family History  Problem Relation Age of Onset   Irritable bowel syndrome Father    Alcoholism Father    Heart attack Father    Bipolar disorder Father    Heart disease Maternal Grandfather    Colon cancer Neg Hx    Rectal cancer Neg Hx    Stomach cancer Neg Hx    Lung disease Neg  Hx    Family Psychiatric  History: NA Social History:  Social History   Substance and Sexual Activity  Alcohol Use Not Currently   Alcohol/week: 1.0 standard drink of alcohol   Types: 1 Cans of beer per week   Comment: social, rare     Social History   Substance and Sexual Activity  Drug Use No    Social History   Socioeconomic History   Marital status: Married    Spouse name: [name]   Number of children: 0   Years of education: 14   Highest education level: Not on file  Occupational History   Occupation: Unemployeed  Tobacco Use   Smoking status: Some Days    Current packs/day: 0.50    Average packs/day: 1.7 packs/day for 30.3 years (52.7 ttl pk-yrs)    Types: Cigarettes    Start date: 1995   Smokeless tobacco: Former    Types: Chew    Quit date: 09/01/1998   Tobacco comments:    tobacco info given 08/01/13.    Smokes 1 ppd.  02/13/23 hfb  Vaping Use   Vaping status: Never Used  Substance and Sexual Activity   Alcohol use: Not Currently    Alcohol/week: 1.0 standard drink of alcohol    Types: 1 Cans of beer per week    Comment: social, rare   Drug use: No   Sexual activity: Yes  Other Topics Concern   Not on file  Social History Narrative   Lives with wife at home   Caffeine use- tea, 3 glasses daily   Social Drivers of Corporate investment banker Strain: Not on file  Food Insecurity: No Food Insecurity (12/29/2023)   Hunger Vital Sign    Worried About Running Out of Food in the Last Year: Never true    Ran Out of Food in the Last Year: Never true  Transportation Needs: No Transportation Needs (12/29/2023)   PRAPARE - Administrator, Civil Service (Medical): No    Lack of Transportation (Non-Medical): No  Physical Activity: Not on file  Stress: Not on file  Social Connections: Socially Isolated (12/29/2023)   Social Connection and Isolation Panel [NHANES]    Frequency of Communication with Friends and Family: Never    Frequency of Social  Gatherings with Friends and Family: Never    Attends Religious Services: Never    Database administrator or Organizations: No    Attends Banker Meetings: Never    Marital Status: Married    Hospital Course:  Pt's wife felt he needed an evaluation. He drove himself to [facility]. Pt says that he has had drug use and he has been having things going on in his life. Patient denies any SI feeling and last attempt was decades ago. No HI. Patient says that his wife told him that he had been responding to voices  not present. Patient says that he was respnding to her voice but she was not talking to her. Patient has been using cocaine (powder) and last use was last night. Patient says that the cocaine use has "gotten out of control." Patient denies use of other substances. Pt reports depression and anxiety. He has mostly social anxiety. Pt has a psychiatrist, Dr. Dolan Freiberg, who is thorugh telehealth. Patient does not feel he needs any inpatient psychiatric care. He has not been any drug treatment centers in the past. Patient says that he used to live a healthy lifestyle and he has lost that about a year ago when he started using cocaine regularly.   Patient was admitted for stabilization. His medications were reviewed  and resumed. Patient restarted taking his medications, attended  most of the group activities as tolerated. Patient remained cooperative throughout this treatment. He admitted to having problem with cocaine use which brings some issues in family. Exported that his wife is supportive and has been visiting. Patient's appetite has improved and he has been sleeping well. Reported no concerns about his medications. He was educated on his psychotropics and the importance of taking them as recommended. Patient reported he is already established with a provider and additional resources were provided. Expresses readiness for discharge and states he is ready to change his lifestyle.       Physical  Findings: AIMS:  , ,  ,  ,    CIWA:    COWS:     Musculoskeletal: Strength & Muscle Tone: within normal limits Gait & Station: normal Patient leans: N/A   Psychiatric Specialty Exam:  Presentation  General Appearance:  Casual  Eye Contact: Fair  Speech: Clear and Coherent  Speech Volume: Normal  Handedness: Right   Mood and Affect  Mood: Anxious  Affect: Congruent   Thought Process  Thought Processes: Coherent  Descriptions of Associations:Intact  Orientation:No data recorded Thought Content:WDL  History of Schizophrenia/Schizoaffective disorder:No  Duration of Psychotic Symptoms:N/A  Hallucinations:Hallucinations: None  Ideas of Reference:None  Suicidal Thoughts:Suicidal Thoughts: No  Homicidal Thoughts:Homicidal Thoughts: No   Sensorium  Memory: Immediate Fair; Recent Fair; Remote Fair  Judgment: Fair  Insight:No data recorded  Executive Functions  Concentration: Fair  Attention Span: Fair  Recall: [employer] of Knowledge: Fair  Language: Fair   Psychomotor Activity  Psychomotor Activity: Psychomotor Activity: Restlessness   Assets  Assets: Communication Skills; Desire for Improvement; Social Support   Sleep  Sleep: Sleep: Fair    Physical Exam: Physical Exam Vitals and nursing note reviewed.  Constitutional:      Appearance: Normal appearance.  HENT:     Head: Normocephalic and atraumatic.     Right Ear: Tympanic membrane normal.     Left Ear: Tympanic membrane normal.     Nose: Nose normal.  Eyes:     Extraocular Movements: Extraocular movements intact.     Pupils: Pupils are equal, round, and reactive to light.  Cardiovascular:     Rate and Rhythm: Normal rate.     Pulses: Normal pulses.  Pulmonary:     Effort: Pulmonary effort is normal.  Musculoskeletal:        General: Normal range of motion.     Cervical back: Normal range of motion and neck supple.  Neurological:     General: No focal  deficit present.     Mental Status: He is alert and oriented to person, place, and time.    Review of Systems  Constitutional: Negative.  HENT: Negative.    Eyes: Negative.   Respiratory: Negative.    Cardiovascular: Negative.   Gastrointestinal: Negative.   Genitourinary: Negative.   Musculoskeletal: Negative.   Skin: Negative.   Neurological: Negative.   Endo/Heme/Allergies: Negative.   Psychiatric/Behavioral:  Positive for substance abuse.    Blood pressure 94/77, pulse 87, temperature 98.1 F (36.7 C), resp. rate 16, height 5\' 9"  (1.753 m), weight 92.6 kg, SpO2 96%. Body mass index is 30.15 kg/m.   Social History   Tobacco Use  Smoking Status Some Days   Current packs/day: 0.50   Average packs/day: 1.7 packs/day for 30.3 years (52.7 ttl pk-yrs)   Types: Cigarettes   Start date: 1995  Smokeless Tobacco Former   Types: Chew   Quit date: 09/01/1998  Tobacco Comments   tobacco info given 08/01/13.   Smokes 1 ppd.  02/13/23 hfb   Tobacco Cessation:  A prescription for an FDA-approved tobacco cessation medication provided at discharge   Blood Alcohol level:  Lab Results  Component Value Date   [facility] <15 12/28/2023    Metabolic Disorder Labs:  Lab Results  Component Value Date   HGBA1C 6.3 (H) 04/25/2020   MPG 134 04/25/2020   No results found for: "PROLACTIN" Lab Results  Component Value Date   CHOL 295 (H) 04/25/2020   TRIG 933 (H) 04/25/2020   HDL 36 (L) 04/25/2020   CHOLHDL 8.2 (H) 04/25/2020   VLDL 45.2 (H) 02/22/2016   LDLCALC  04/25/2020     Comment:     . LDL cholesterol not calculated. Triglyceride levels greater than 400 mg/dL invalidate calculated LDL results. . Reference range: <100 . Desirable range <100 mg/dL for primary prevention;   <70 mg/dL for patients with CHD or diabetic patients  with > or = 2 CHD risk factors. Aaron Aas LDL-C is now calculated using the Martin-Hopkins  calculation, which is a validated novel method providing  better  accuracy than the Friedewald equation in the  estimation of LDL-C.  Melinda Sprawls et al. Erroll Heard. 4132;440(10): 2061-2068  (http://education.[employer].com/faq/FAQ164)     See Psychiatric Specialty Exam and Suicide Risk Assessment completed by Attending Physician prior to discharge.  Discharge destination:  Home  Is patient on multiple antipsychotic therapies at discharge:  No   Has Patient had three or more failed trials of antipsychotic monotherapy by history:  No  Recommended Plan for Multiple Antipsychotic Therapies: NA   Allergies as of 12/31/2023       Reactions   Topamax [topiramate]    Sleep walking, hallucinations, "I get mean, can't help it"        Medication List     STOP taking these medications    ibuprofen 800 MG tablet Commonly known as: ADVIL   linaclotide  290 MCG Caps capsule Commonly known as: Linzess    pantoprazole  40 MG tablet Commonly known as: PROTONIX    prochlorperazine  10 MG tablet Commonly known as: COMPAZINE    traMADol  50 MG tablet Commonly known as: ULTRAM        TAKE these medications      Indication  albuterol  108 (90 Base) MCG/ACT inhaler Commonly known as: VENTOLIN  HFA TAKE 2 PUFFS BY MOUTH EVERY 6 HOURS AS NEEDED FOR WHEEZE OR SHORTNESS OF BREATH What changed: See the new instructions.  Indication: Chronic Obstructive Lung Disease   ALPRAZolam  1 MG tablet Commonly known as: XANAX  TAKE 1 TABLET BY MOUTH 3 TIMES A DAY AS NEEDED FOR ANXIETY,PANIC ATTACKS OR AGITATION What changed:  how much to take how to take  this when to take this reasons to take this additional instructions  Indication: Feeling Anxious   budesonide -formoterol  80-4.5 MCG/ACT inhaler Commonly known as: SYMBICORT  INHALE 2 PUFFS INTO THE LUNGS TWICE A DAY What changed: See the new instructions.  Indication: Asthma   fluticasone  50 MCG/ACT nasal spray Commonly known as: FLONASE  Place 1 spray into both nostrils daily. What changed:  when to take  this reasons to take this  Indication: Allergic Rhinitis   lamoTRIgine  150 MG tablet Commonly known as: LAMICTAL  Take 2 tablets (300 mg total) by mouth at bedtime.    QUEtiapine  300 MG tablet Commonly known as: SEROQUEL  Take 1 tablet (300 mg total) by mouth at bedtime.  Indication: Depressive Phase of Manic-Depression   zolpidem  10 MG tablet Commonly known as: AMBIEN  TAKE 1 TABLET BY MOUTH EVERYDAY AT BEDTIME What changed:  how much to take how to take this when to take this additional instructions  Indication: Trouble Sleeping        Follow-up Information     [facility] Follow up.   Why: In person psychiatry appointment 05/14 at 12:30 PM with Dr. Dolan Freiberg. Contact information: [address],  [place] [address]   [phone]   fax [phone]  [email or web address]        [facility] Follow up.   Why: Although you declined outpatient therapy follow up, [facility]'s walk in hours for crisis services are: 24 Hours Monday-Friday at the [facility] at [address] [place], [place] [address]. Contact information: [phone]  Address [address] [place], [place] [address]  Hours Open 24/7. No appointment required.               Follow-up recommendations:  Activity:  As tolerated Diet:  Regular  Comments:  Follow up recommendations provided  Signed: Elston Halsted, NP 12/31/2023, 3:02 PM

## 2024-01-01 ENCOUNTER — Telehealth: Payer: Self-pay

## 2024-01-01 NOTE — Transitions of Care (Post Inpatient/ED Visit) (Signed)
   01/01/2024  Name: Sean Wu MRN: [id number] DOB: [birth year]  Today's TOC FU Call Status: Today's TOC FU Call Status:: Unsuccessful Call (1st Attempt) Unsuccessful Call (1st Attempt) Date: 01/01/24  Attempted to reach the patient regarding the most recent Inpatient/ED visit. Called to patient and phone goes straight to VM. Left a message requesting a call back.   Follow Up Plan: Additional outreach attempts will be made to reach the patient to complete the Transitions of Care (Post Inpatient/ED visit) call.   Orpha Blade, RN, BSN, CEN [employer]- Transition of Care Team.  [facility] [phone]

## 2024-01-04 ENCOUNTER — Telehealth: Payer: Self-pay

## 2024-01-04 NOTE — Transitions of Care (Post Inpatient/ED Visit) (Signed)
   01/04/2024  Name: Sean Wu MRN: [id number] DOB: 1970/12/24  Today's TOC FU Call Status: Today's TOC FU Call Status:: Unsuccessful Call (2nd Attempt) Unsuccessful Call (2nd Attempt) Date: 01/04/24  Attempted to reach the patient regarding the most recent Inpatient/ED visit.  Follow Up Plan: Additional outreach attempts will be made to reach the patient to complete the Transitions of Care (Post Inpatient/ED visit) call.    Orpha Blade, RN, BSN, CEN [employer]- Transition of Care Team.  [facility] [phone]

## 2024-01-05 ENCOUNTER — Telehealth: Payer: Self-pay | Admitting: *Deleted

## 2024-01-05 NOTE — Transitions of Care (Post Inpatient/ED Visit) (Signed)
   01/05/2024  Name: ESA SANDERLIN MRN: [id number] DOB: 1971-03-14  Today's TOC FU Call Status: Today's TOC FU Call Status:: Unsuccessful Call (3rd Attempt) Unsuccessful Call (3rd Attempt) Date: 01/05/24  Attempted to reach the patient regarding the most recent Inpatient/ED visit.  Follow Up Plan: No further outreach attempts will be made at this time. We have been unable to contact the patient.  Una Ganser BSN RN [facility] Health Care Management Coordinator [email or web address] Direct Dial: [phone]  Fax: [phone] Website: [email or web address]

## 2024-01-13 DIAGNOSIS — F4322 Adjustment disorder with anxiety: Secondary | ICD-10-CM | POA: Diagnosis not present

## 2024-03-25 ENCOUNTER — Other Ambulatory Visit: Payer: Self-pay | Admitting: Internal Medicine

## 2024-03-25 ENCOUNTER — Other Ambulatory Visit: Payer: Self-pay | Admitting: Nurse Practitioner

## 2024-03-25 DIAGNOSIS — J301 Allergic rhinitis due to pollen: Secondary | ICD-10-CM

## 2024-04-05 ENCOUNTER — Encounter: Admitting: Family Medicine

## 2024-04-06 ENCOUNTER — Encounter: Admitting: Family Medicine

## 2024-04-06 DIAGNOSIS — Z0000 Encounter for general adult medical examination without abnormal findings: Secondary | ICD-10-CM

## 2024-05-04 ENCOUNTER — Ambulatory Visit: Admitting: Family Medicine

## 2024-05-06 ENCOUNTER — Ambulatory Visit: Admitting: Family Medicine

## 2024-05-09 ENCOUNTER — Ambulatory Visit (INDEPENDENT_AMBULATORY_CARE_PROVIDER_SITE_OTHER): Admitting: Family Medicine

## 2024-05-09 ENCOUNTER — Encounter: Payer: Self-pay | Admitting: Family Medicine

## 2024-05-09 ENCOUNTER — Encounter: Payer: Self-pay | Admitting: Emergency Medicine

## 2024-05-09 VITALS — BP 100/70 | HR 97 | Temp 98.5°F | Resp 16 | Ht 69.0 in | Wt 197.0 lb

## 2024-05-09 DIAGNOSIS — G8929 Other chronic pain: Secondary | ICD-10-CM

## 2024-05-09 DIAGNOSIS — M545 Low back pain, unspecified: Secondary | ICD-10-CM

## 2024-05-09 DIAGNOSIS — L819 Disorder of pigmentation, unspecified: Secondary | ICD-10-CM

## 2024-05-09 DIAGNOSIS — Z23 Encounter for immunization: Secondary | ICD-10-CM

## 2024-05-09 DIAGNOSIS — Z122 Encounter for screening for malignant neoplasm of respiratory organs: Secondary | ICD-10-CM | POA: Diagnosis not present

## 2024-05-09 DIAGNOSIS — Z1159 Encounter for screening for other viral diseases: Secondary | ICD-10-CM

## 2024-05-09 DIAGNOSIS — R7309 Other abnormal glucose: Secondary | ICD-10-CM | POA: Diagnosis not present

## 2024-05-09 DIAGNOSIS — E1169 Type 2 diabetes mellitus with other specified complication: Secondary | ICD-10-CM

## 2024-05-09 MED ORDER — METHOCARBAMOL 500 MG PO TABS: 500.0000 mg | ORAL_TABLET | Freq: Two times a day (BID) | ORAL | 0 refills | Status: AC | PRN

## 2024-05-09 NOTE — Progress Notes (Signed)
 "  ACUTE VISIT Chief Complaint  Patient presents with   Skin Concern    Pt c/o skin discoloration on front and R side. Going on for 6 months. Denied itchy, sore. Increase in size.     Discussed the use of AI scribe software for clinical note transcription with the patient, who gave verbal consent to proceed. History of Present Illness Sean Wu is a 53 year old male  with PMHx significant for allergic rhinitis, GERD, OSA, chronic pain, gastroparesis, and bipolar disorder ; who presents with skin color changes on the right side of his body.  Approximately six months ago, he noticed skin pigmentation changes on the right side of his abdomen. Initially, there was one lesion that increased in size before shrinking back to its current size. Since then, additional smaller lesions have appeared in the same area and on left side. These lesions do not pruritic,tender, or have any texture changes such as scaliness.  He reports no recent changes in medication or lifestyle.  No family history of similar skin changes. No known insect bites or outdoor exposures. He has not noted masses/nodular lesions associated.  No hx of diabetes or other endocrinologic disorder. Glucose mildly elevated in 12/2023, 113.  -He has been experiencing new, severe back pain over the past three to four months. The pain is located in the middle of his back, extending towards the right side. It is described as severe when breathing deeply and in certain positions, such as twisting. No history of trauma, fever, chills, or radiation of pain to the legs. He has not tried any over-the-counter medications or topical treatments for the pain.  He is a smoker and has not had a lung cancer screening before. He has not received a flu shot, pneumonia shot, or shingles vaccine recently.  Review of Systems  Constitutional:  Negative for activity change, appetite change and fever.  HENT:  Negative for mouth sores and sore  throat.   Eyes:  Negative for discharge and redness.  Respiratory:  Negative for cough and shortness of breath.   Cardiovascular:  Negative for chest pain and leg swelling.  Gastrointestinal:  Negative for abdominal pain, nausea and vomiting.  Endocrine: Negative for cold intolerance, heat intolerance, polydipsia, polyphagia and polyuria.  Genitourinary:  Negative for decreased urine volume, dysuria and hematuria.  Skin:  Negative for wound.  Neurological:  Negative for syncope, facial asymmetry and weakness.  See other pertinent positives and negatives in HPI.  Current Outpatient Medications on File Prior to Visit  Medication Sig Dispense Refill   albuterol  (VENTOLIN  HFA) 108 (90 Base) MCG/ACT inhaler TAKE 2 PUFFS BY MOUTH EVERY 6 HOURS AS NEEDED FOR WHEEZE OR SHORTNESS OF BREATH (Patient taking differently: Inhale 2 puffs into the lungs every 6 (six) hours as needed for wheezing or shortness of breath.) 8.5 each 1   ALPRAZolam  (XANAX ) 1 MG tablet TAKE 1 TABLET BY MOUTH 3 TIMES A DAY AS NEEDED FOR ANXIETY,PANIC ATTACKS OR AGITATION (Patient taking differently: Take 1 mg by mouth 3 (three) times daily as needed for anxiety (agitation).) 90 tablet 2   budesonide -formoterol  (SYMBICORT ) 80-4.5 MCG/ACT inhaler INHALE 2 PUFFS INTO THE LUNGS TWICE A DAY (Patient taking differently: Inhale 2 puffs into the lungs daily as needed (Wheezing/SOB).) 30.6 each 1   fluticasone  (FLONASE ) 50 MCG/ACT nasal spray SPRAY 1 SPRAY INTO BOTH NOSTRILS DAILY. 16 mL 5   lamoTRIgine  (LAMICTAL ) 150 MG tablet Take 2 tablets (300 mg total) by mouth at bedtime. 180  tablet 0   QUEtiapine  (SEROQUEL ) 300 MG tablet Take 1 tablet (300 mg total) by mouth at bedtime. 90 tablet 0   zolpidem  (AMBIEN ) 10 MG tablet TAKE 1 TABLET BY MOUTH EVERYDAY AT BEDTIME (Patient taking differently: Take 10 mg by mouth at bedtime.) 30 tablet 2   [DISCONTINUED] omeprazole  (PRILOSEC  OTC) 20 MG tablet 1 tablet twice daily. Do not eat for one hour after  ingestion of the medication 60 tablet 11   No current facility-administered medications on file prior to visit.    Past Medical History:  Diagnosis Date   ALLERGIC RHINITIS 05/10/2009   Allergy    anemia   Anxiety    Bipolar disorder (HCC)    see medications patient to bring   Chronic headaches    DDD (degenerative disc disease)    DEPRESSION 05/10/2009   Gastroparesis    GERD 05/10/2009   LIBIDO, DECREASED 05/10/2009   Neuromuscular disorder (HCC)    pt. denies 08/01/16   Osgood-Schlatter's disease    SLEEP APNEA, OBSTRUCTIVE 06/12/2009   CPAP not utilized after 30 lb. weight loss   SMOKER 06/12/2009   TESTICULAR HYPOFUNCTION 06/12/2009   WEIGHT GAIN 05/10/2009   Allergies  Allergen Reactions   Topamax [Topiramate]     Sleep walking, hallucinations, I get mean, can't help it    Social History   Socioeconomic History   Marital status: Married    Spouse name: [name]   Number of children: 0   Years of education: 14   Highest education level: Not on file  Occupational History   Occupation: Unemployeed  Tobacco Use   Smoking status: Some Days    Current packs/day: 0.50    Average packs/day: 1.7 packs/day for 30.7 years (52.8 ttl pk-yrs)    Types: Cigarettes    Start date: 1995   Smokeless tobacco: Former    Types: Chew    Quit date: 09/01/1998   Tobacco comments:    tobacco info given 08/01/13.    Smokes 1 ppd.  02/13/23 hfb  Vaping Use   Vaping status: Never Used  Substance and Sexual Activity   Alcohol use: Not Currently    Alcohol/week: 1.0 standard drink of alcohol    Types: 1 Cans of beer per week    Comment: social, rare   Drug use: No   Sexual activity: Yes  Other Topics Concern   Not on file  Social History Narrative   Lives with wife at home   Caffeine use- tea, 3 glasses daily   Social Drivers of Corporate Investment Banker Strain: Not on file  Food Insecurity: No Food Insecurity (12/29/2023)   Hunger Vital Sign    Worried About Running Out of Food  in the Last Year: Never true    Ran Out of Food in the Last Year: Never true  Transportation Needs: No Transportation Needs (12/29/2023)   PRAPARE - Administrator, Civil Service (Medical): No    Lack of Transportation (Non-Medical): No  Physical Activity: Not on file  Stress: Not on file  Social Connections: Socially Isolated (12/29/2023)   Social Connection and Isolation Panel    Frequency of Communication with Friends and Family: Never    Frequency of Social Gatherings with Friends and Family: Never    Attends Religious Services: Never    Database Administrator or Organizations: No    Attends Banker Meetings: Never    Marital Status: Married   Vitals:   05/09/24 1536  BP: 100/70  Pulse: 97  Resp: 16  Temp: 98.5 F (36.9 C)  SpO2: 97%   Body mass index is 29.09 kg/m.  Physical Exam Vitals and nursing note reviewed.  Constitutional:      General: He is not in acute distress.    Appearance: Normal appearance. He is well-developed. He is not ill-appearing.  HENT:     Head: Normocephalic and atraumatic.     Mouth/Throat:     Mouth: Mucous membranes are moist.  Eyes:     Conjunctiva/sclera: Conjunctivae normal.  Neck:     Trachea: No tracheal deviation.  Cardiovascular:     Rate and Rhythm: Normal rate and regular rhythm.     Heart sounds: No murmur heard. Pulmonary:     Effort: Pulmonary effort is normal. No respiratory distress.     Breath sounds: Normal breath sounds.  Lymphadenopathy:     Cervical: No cervical adenopathy.  Skin:    General: Skin is warm.     Findings: Rash present. No erythema.     Comments: Photos taking after verbal consent.  Hyperpigmented macular lesions, defined borders, some on left side are confluent. No erythema or masses appreciated. See pictures.  Neurological:     General: No focal deficit present.     Mental Status: He is alert and oriented to person, place, and time.  Psychiatric:        Mood and Affect:  Mood and affect normal.        ASSESSMENT AND PLAN: Mr Bautch was seen today for skin pigmentation changes and back pain. Orders Placed This Encounter  Procedures   Pneumococcal conjugate vaccine 20-valent (Prevnar 20)   Basic metabolic panel with GFR   Hemoglobin A1c   Hepatitis C antibody   Ambulatory Referral Lung Cancer Screening [facility]   Ambulatory referral to Dermatology   Amb Referral to Nutrition and Diabetic Education   Lab Results  Component Value Date   NA 134 (L) 05/09/2024   CL 100 05/09/2024   K 4.5 05/09/2024   CO2 25 05/09/2024   BUN 12 05/09/2024   CREATININE 0.74 05/09/2024   GFR 102.07 05/09/2024   CALCIUM  9.4 05/09/2024   ALBUMIN 3.9 12/28/2023   GLUCOSE 108 (H) 05/09/2024   Lab Results  Component Value Date   HGBA1C 7.0 (H) 05/09/2024   Chronic right-sided low back pain without sciatica Most likely musculoskeletal benign problem. We discussed the option of having lumbar X ray, he refers to hold on it. Tylenol  500 mg 3-4 times per day and topical OTC products like IcyHot or Aspercreme may help. Today I recommended methocarbamol  500 mg twice daily as needed, mainly at bedtime.  We discussed some side effects. Monitor for new symptoms. F/U as needed.  -     Methocarbamol ; Take 1 tablet (500 mg total) by mouth 2 (two) times daily as needed for muscle spasms.  Dispense: 60 tablet; Refill: 0  Hyperpigmentation of skin We discussed possible etiologies. Reports it as a new problems, most likely benign, no systemic symptoms.Lesions are similar to cafe-au-lait macules but no prior hx. Continue monitoring for new symptoms. Dermatology referral placed.  -     Ambulatory referral to Dermatology  High glucose level -     Basic metabolic panel with GFR; Future -     Hemoglobin A1c; Future  Screening for lung cancer -     Ambulatory Referral for Lung Cancer Scre  Encounter for HCV screening test for low risk patient -  Hepatitis C antibody;  Future  Need for pneumococcal vaccination -     Pneumococcal conjugate vaccine 20-valent  Return if symptoms worsen or fail to improve.  Angelia Hazell G. Ysmael Hires, MD  [facility]. [place] office.   "

## 2024-05-09 NOTE — Patient Instructions (Addendum)
 A few things to remember from today's visit:  Screening for lung cancer - Plan: Ambulatory Referral Lung Cancer Screening [facility]  Encounter for HCV screening test for low risk patient - Plan: Hepatitis C antibody  Hyperpigmentation of skin - Plan: Ambulatory referral to Dermatology  High glucose level - Plan: Basic metabolic panel with GFR, Hemoglobin A1c  Chronic right-sided low back pain without sciatica - Plan: methocarbamol  (ROBAXIN ) 500 MG tablet  Try Methocarbamol  at bedtime for back pain and icy hot. We could consider PT if persistent.  Do not use My Chart to request refills or for acute issues that need immediate attention. If you send a my chart message, it may take a few days to be addressed, specially if I am not in the office.  Please be sure medication list is accurate. If a new problem present, please set up appointment sooner than planned today.

## 2024-05-10 LAB — BASIC METABOLIC PANEL WITH GFR
BUN: 12 mg/dL (ref 6–23)
CO2: 25 meq/L (ref 19–32)
Calcium: 9.4 mg/dL (ref 8.4–10.5)
Chloride: 100 meq/L (ref 96–112)
Creatinine, Ser: 0.74 mg/dL (ref 0.40–1.50)
GFR: 102.07 mL/min
Glucose, Bld: 108 mg/dL — ABNORMAL HIGH (ref 70–99)
Potassium: 4.5 meq/L (ref 3.5–5.1)
Sodium: 134 meq/L — ABNORMAL LOW (ref 135–145)

## 2024-05-10 LAB — HEPATITIS C ANTIBODY: Hepatitis C Ab: NONREACTIVE

## 2024-05-10 LAB — HEMOGLOBIN A1C: Hgb A1c MFr Bld: 7 % — ABNORMAL HIGH (ref 4.6–6.5)

## 2024-05-11 DIAGNOSIS — F3181 Bipolar II disorder: Secondary | ICD-10-CM | POA: Diagnosis not present

## 2024-05-12 ENCOUNTER — Ambulatory Visit: Payer: Self-pay | Admitting: Family Medicine

## 2024-05-12 ENCOUNTER — Encounter: Payer: Self-pay | Admitting: Family Medicine

## 2024-05-12 DIAGNOSIS — E119 Type 2 diabetes mellitus without complications: Secondary | ICD-10-CM | POA: Insufficient documentation

## 2024-05-12 DIAGNOSIS — E1169 Type 2 diabetes mellitus with other specified complication: Secondary | ICD-10-CM

## 2024-05-12 MED ORDER — ACCU-CHEK AVIVA PLUS W/DEVICE KIT: PACK | 0 refills | Status: AC

## 2024-05-13 ENCOUNTER — Encounter: Payer: Self-pay | Admitting: Family Medicine

## 2024-06-23 ENCOUNTER — Other Ambulatory Visit: Payer: Self-pay | Admitting: Family Medicine

## 2024-06-23 DIAGNOSIS — R110 Nausea: Secondary | ICD-10-CM

## 2024-07-31 ENCOUNTER — Other Ambulatory Visit: Payer: Self-pay | Admitting: Family Medicine

## 2024-07-31 DIAGNOSIS — R110 Nausea: Secondary | ICD-10-CM

## 2024-08-12 ENCOUNTER — Other Ambulatory Visit: Payer: Self-pay | Admitting: Family Medicine

## 2024-08-12 DIAGNOSIS — R110 Nausea: Secondary | ICD-10-CM
